# Patient Record
Sex: Female | Born: 1970 | Race: White | Hispanic: No | Marital: Married | Smoking: Never smoker
Health system: Southern US, Community
[De-identification: ages and names within clinical notes are randomized; demographics above are authoritative.]

## PROBLEM LIST (undated history)

## (undated) DIAGNOSIS — I2699 Other pulmonary embolism without acute cor pulmonale: Secondary | ICD-10-CM

## (undated) DIAGNOSIS — D649 Anemia, unspecified: Secondary | ICD-10-CM

## (undated) DIAGNOSIS — I809 Phlebitis and thrombophlebitis of unspecified site: Secondary | ICD-10-CM

## (undated) DIAGNOSIS — F329 Major depressive disorder, single episode, unspecified: Secondary | ICD-10-CM

## (undated) DIAGNOSIS — M1990 Unspecified osteoarthritis, unspecified site: Secondary | ICD-10-CM

## (undated) DIAGNOSIS — F509 Eating disorder, unspecified: Secondary | ICD-10-CM

## (undated) DIAGNOSIS — Z9889 Other specified postprocedural states: Secondary | ICD-10-CM

## (undated) DIAGNOSIS — Z86711 Personal history of pulmonary embolism: Secondary | ICD-10-CM

## (undated) DIAGNOSIS — F419 Anxiety disorder, unspecified: Secondary | ICD-10-CM

## (undated) DIAGNOSIS — F32A Depression, unspecified: Secondary | ICD-10-CM

## (undated) DIAGNOSIS — B019 Varicella without complication: Secondary | ICD-10-CM

## (undated) DIAGNOSIS — M1712 Unilateral primary osteoarthritis, left knee: Secondary | ICD-10-CM

## (undated) DIAGNOSIS — K219 Gastro-esophageal reflux disease without esophagitis: Secondary | ICD-10-CM

## (undated) DIAGNOSIS — I10 Essential (primary) hypertension: Secondary | ICD-10-CM

## (undated) DIAGNOSIS — M1711 Unilateral primary osteoarthritis, right knee: Secondary | ICD-10-CM

## (undated) DIAGNOSIS — Z96652 Presence of left artificial knee joint: Secondary | ICD-10-CM

## (undated) HISTORY — DX: Major depressive disorder, single episode, unspecified: F32.9

## (undated) HISTORY — PX: JOINT REPLACEMENT: SHX530

## (undated) HISTORY — PX: TUBAL LIGATION: SHX77

## (undated) HISTORY — PX: NOVASURE ABLATION: SHX5394

## (undated) HISTORY — DX: Varicella without complication: B01.9

## (undated) HISTORY — DX: Other pulmonary embolism without acute cor pulmonale: I26.99

## (undated) HISTORY — DX: Unspecified osteoarthritis, unspecified site: M19.90

## (undated) HISTORY — PX: SMALL INTESTINE SURGERY: SHX150

## (undated) HISTORY — DX: Phlebitis and thrombophlebitis of unspecified site: I80.9

## (undated) HISTORY — PX: HERNIA REPAIR: SHX51

## (undated) HISTORY — DX: Eating disorder, unspecified: F50.9

## (undated) HISTORY — DX: Depression, unspecified: F32.A

---

## 2000-03-14 DIAGNOSIS — Z86711 Personal history of pulmonary embolism: Secondary | ICD-10-CM | POA: Diagnosis present

## 2000-03-14 HISTORY — DX: Personal history of pulmonary embolism: Z86.711

## 2000-03-14 HISTORY — PX: GASTRIC BYPASS: SHX52

## 2004-06-11 ENCOUNTER — Ambulatory Visit: Payer: Self-pay | Admitting: Obstetrics and Gynecology

## 2008-01-25 ENCOUNTER — Ambulatory Visit: Payer: Self-pay | Admitting: Unknown Physician Specialty

## 2008-01-29 ENCOUNTER — Ambulatory Visit: Payer: Self-pay | Admitting: Unknown Physician Specialty

## 2010-04-18 LAB — HM PAP SMEAR: HM Pap smear: NORMAL

## 2011-08-16 ENCOUNTER — Ambulatory Visit: Payer: Self-pay | Admitting: Orthopedic Surgery

## 2011-08-23 ENCOUNTER — Other Ambulatory Visit: Payer: Self-pay | Admitting: Vascular Surgery

## 2011-08-23 LAB — COMPREHENSIVE METABOLIC PANEL
Albumin: 3.6 g/dL (ref 3.4–5.0)
Anion Gap: 8 (ref 7–16)
Bilirubin,Total: 0.5 mg/dL (ref 0.2–1.0)
Co2: 26 mmol/L (ref 21–32)
Creatinine: 0.84 mg/dL (ref 0.60–1.30)
Glucose: 84 mg/dL (ref 65–99)
SGOT(AST): 23 U/L (ref 15–37)
Sodium: 139 mmol/L (ref 136–145)

## 2011-10-13 ENCOUNTER — Ambulatory Visit: Payer: Self-pay | Admitting: Internal Medicine

## 2011-10-17 ENCOUNTER — Ambulatory Visit (INDEPENDENT_AMBULATORY_CARE_PROVIDER_SITE_OTHER): Payer: 59 | Admitting: Internal Medicine

## 2011-10-17 ENCOUNTER — Encounter: Payer: Self-pay | Admitting: Internal Medicine

## 2011-10-17 VITALS — BP 140/80 | HR 87 | Temp 98.3°F | Ht 65.0 in | Wt 186.2 lb

## 2011-10-17 DIAGNOSIS — Z0000 Encounter for general adult medical examination without abnormal findings: Secondary | ICD-10-CM

## 2011-10-17 DIAGNOSIS — F329 Major depressive disorder, single episode, unspecified: Secondary | ICD-10-CM

## 2011-10-17 DIAGNOSIS — M170 Bilateral primary osteoarthritis of knee: Secondary | ICD-10-CM

## 2011-10-17 DIAGNOSIS — B079 Viral wart, unspecified: Secondary | ICD-10-CM

## 2011-10-17 DIAGNOSIS — E669 Obesity, unspecified: Secondary | ICD-10-CM

## 2011-10-17 DIAGNOSIS — M1710 Unilateral primary osteoarthritis, unspecified knee: Secondary | ICD-10-CM

## 2011-10-17 DIAGNOSIS — Z1239 Encounter for other screening for malignant neoplasm of breast: Secondary | ICD-10-CM

## 2011-10-17 MED ORDER — MELOXICAM 15 MG PO TABS: 15.0000 mg | ORAL_TABLET | Freq: Every day | ORAL | Status: DC

## 2011-10-17 MED ORDER — BUPROPION HCL ER (XL) 150 MG PO TB24: 150.0000 mg | ORAL_TABLET | Freq: Every day | ORAL | Status: DC

## 2011-10-17 MED ORDER — SERTRALINE HCL 25 MG PO TABS: 25.0000 mg | ORAL_TABLET | Freq: Every day | ORAL | Status: DC

## 2011-10-17 NOTE — Assessment & Plan Note (Signed)
BMI 30. Patient is status post gastric bypass surgery. Discussed food addiction today. Encouraged her to consider keeping a food diary and/or participating in additional exercise activities such as water activities. We'll also add Wellbutrin to see if this helps with appetite.

## 2011-10-17 NOTE — Progress Notes (Signed)
Subjective:    Patient ID: Anne James, female    DOB: 1970-05-22, 41 y.o.   MRN: [id number]  HPI 41 year old female presents to establish care. She reports a history of morbid obesity for which she underwent gastric bypass surgery. She reports that she tolerated the procedure well and got down to a low weight of approximately 145. Over the last year she has regained approximately 25 pounds. 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 She notes it has been a stressful time for her family. 16 years ago she lost her son to come occasions of down syndrome. She is now dealing with ongoing issues with her daughter who has been ill. In an effort to help with anxiety and depression, her previous PCP started her on sertraline. She reports that she seems to have gained more weight since starting this medication. She would like to try tapering off the medication. Next  She also notes a history of arthritis in her knees right greater than left. She has been followed by orthopedic surgery and had cortisone injection in her right knee a few months ago. She reports some improvement with this. She also takes meloxicam with some improvement in her symptoms. Her exercise has been limited by pain in her right knee.  Outpatient Encounter Prescriptions as of 10/17/2011  Medication Sig Dispense Refill  . buPROPion (WELLBUTRIN XL) 150 MG 24 hr tablet Take 1 tablet (150 mg total) by mouth daily.  30 tablet  6  . Cholecalciferol (VITAMIN D-3) 5000 UNITS TABS Take 1 tablet by mouth daily.      . cyanocobalamin 500 MCG tablet Take 500 mcg by mouth daily.      . meloxicam (MOBIC) 15 MG tablet Take 1 tablet (15 mg total) by mouth daily.  30 tablet  6  . Multiple Vitamin (MULTIVITAMIN) tablet Take 1 tablet by mouth daily.      . sertraline (ZOLOFT) 25 MG tablet Take 1 tablet (25 mg total) by mouth daily.  30 tablet  6  . DISCONTD: sertraline (ZOLOFT) 50 MG tablet Take 50 mg by mouth daily.      [name]  DISCONTD: sertraline (ZOLOFT) 50 MG tablet         Review of Systems  Constitutional: Negative for fever, chills, appetite change, fatigue and unexpected weight change.  HENT: Negative for ear pain, congestion, sore throat, trouble swallowing, neck pain, voice change and sinus pressure.   Eyes: Negative for visual disturbance.  Respiratory: Negative for cough, shortness of breath, wheezing and stridor.   Cardiovascular: Negative for chest pain, palpitations and leg swelling.  Gastrointestinal: Negative for nausea, vomiting, abdominal pain, diarrhea, constipation, blood in stool, abdominal distention and anal bleeding.  Genitourinary: Negative for dysuria and flank pain.  Musculoskeletal: Negative for myalgias, arthralgias and gait problem.  Skin: Negative for color change and rash.  Neurological: Negative for dizziness and headaches.  Hematological: Negative for adenopathy. Does not bruise/bleed easily.  Psychiatric/Behavioral: Positive for dysphoric mood. Negative for suicidal ideas and disturbed wake/sleep cycle. The patient is nervous/anxious.        Objective:   Physical Exam  Constitutional: She is oriented to person, place, and time. She appears well-developed and well-nourished. No distress.  HENT:  Head: Normocephalic and atraumatic.  Right Ear: External ear normal.  Left Ear: External ear normal.  Nose: Nose normal.  Mouth/Throat: Oropharynx is clear and moist. No oropharyngeal exudate.  Eyes: Conjunctivae are normal. Pupils are equal, round, and reactive to light. Right eye exhibits no  discharge. Left eye exhibits no discharge. No scleral icterus.  Neck: Normal range of motion. Neck supple. No tracheal deviation present. No thyromegaly present.  Cardiovascular: Normal rate, regular rhythm, normal heart sounds and intact distal pulses.  Exam reveals no gallop and no friction rub.   No murmur heard. Pulmonary/Chest: Effort normal and breath sounds normal. No respiratory  distress. She has no wheezes. She has no rales. She exhibits no tenderness.  Musculoskeletal: Normal range of motion. She exhibits no edema and no tenderness.  Lymphadenopathy:    She has no cervical adenopathy.  Neurological: She is alert and oriented to person, place, and time. No cranial nerve deficit. She exhibits normal muscle tone. Coordination normal.  Skin: Skin is warm and dry. Rash noted. Rash is papular (verrucae on right hand). She is not diaphoretic. No erythema. No pallor.  Psychiatric: Her speech is normal and behavior is normal. Judgment and thought content normal. Cognition and memory are normal. She exhibits a depressed mood.          Assessment & Plan:

## 2011-10-17 NOTE — Assessment & Plan Note (Signed)
Symptoms persistent on sertraline. Will taper off sertraline by decreasing to 25 mg daily x1 month. Will add Wellbutrin 150 mg daily. Followup one month.

## 2011-10-17 NOTE — Assessment & Plan Note (Signed)
Numerous sports over right hand. Not responsive to topical over-the-counter medications. Will set up dermatology evaluation for cryotherapy.

## 2011-10-17 NOTE — Assessment & Plan Note (Signed)
Osteoarthritis both knees. Encouraged her to followup with her orthopedic surgeon for repeat steroid injection in her right knee. We'll continue meloxicam. Encouraged her to participate in nonweightbearing activities such as swimming.

## 2011-10-17 NOTE — Assessment & Plan Note (Signed)
Mammogram ordered today 

## 2011-10-18 LAB — B12 AND FOLATE PANEL: Folate: 20.9 ng/mL (ref >5.9)

## 2011-10-20 LAB — CBC WITH DIFFERENTIAL/PLATELET
Basophils Absolute: 0 10*3/uL (ref 0.0–0.2)
Basos: 0 % (ref 0–3)
Hemoglobin: 12.7 g/dL (ref 11.1–15.9)
Immature Grans (Abs): 0 10*3/uL (ref 0.0–0.1)
Lymphs: 21 % (ref 14–46)
Monocytes: 11 % (ref 4–13)
Neutrophils Absolute: 5.4 10*3/uL (ref 1.8–7.8)
Neutrophils Relative %: 66 % (ref 40–74)
RBC: 4.61 x10E6/uL (ref 3.77–5.28)

## 2011-10-20 LAB — COMPREHENSIVE METABOLIC PANEL
Albumin/Globulin Ratio: 1.5 (ref 1.1–2.5)
Albumin: 4.2 g/dL (ref 3.5–5.5)
BUN: 15 mg/dL (ref 6–24)
Creatinine, Ser: 0.76 mg/dL (ref 0.57–1.00)
GFR calc Af Amer: 111 mL/min/{1.73_m2} (ref >59)
GFR calc non Af Amer: 96 mL/min/{1.73_m2} (ref >59)
Glucose: 93 mg/dL (ref 65–99)
Total Bilirubin: 0.4 mg/dL (ref 0.0–1.2)
Total Protein: 7 g/dL (ref 6.0–8.5)

## 2011-10-20 LAB — LIPID PANEL
HDL: 114 mg/dL (ref >39)
LDL Calculated: 94 mg/dL (ref 0–99)
VLDL Cholesterol Cal: 17 mg/dL (ref 5–40)

## 2011-10-20 LAB — TSH: TSH: 1.86 u[IU]/mL (ref 0.450–4.500)

## 2011-11-01 ENCOUNTER — Encounter: Payer: Self-pay | Admitting: Internal Medicine

## 2011-11-01 LAB — HM MAMMOGRAPHY: HM Mammogram: NORMAL

## 2011-11-16 ENCOUNTER — Ambulatory Visit: Payer: 59 | Admitting: Internal Medicine

## 2011-11-24 ENCOUNTER — Encounter: Payer: Self-pay | Admitting: Internal Medicine

## 2011-11-24 ENCOUNTER — Ambulatory Visit (INDEPENDENT_AMBULATORY_CARE_PROVIDER_SITE_OTHER): Payer: 59 | Admitting: Internal Medicine

## 2011-11-24 VITALS — BP 120/90 | HR 71 | Temp 98.5°F | Ht 65.0 in | Wt 186.2 lb

## 2011-11-24 DIAGNOSIS — M1710 Unilateral primary osteoarthritis, unspecified knee: Secondary | ICD-10-CM

## 2011-11-24 DIAGNOSIS — M170 Bilateral primary osteoarthritis of knee: Secondary | ICD-10-CM

## 2011-11-24 DIAGNOSIS — F329 Major depressive disorder, single episode, unspecified: Secondary | ICD-10-CM

## 2011-11-24 MED ORDER — MELOXICAM 15 MG PO TABS: 15.0000 mg | ORAL_TABLET | Freq: Every day | ORAL | Status: AC

## 2011-11-24 NOTE — Assessment & Plan Note (Signed)
Symptoms well controlled with meloxicam. We'll continue. 

## 2011-11-24 NOTE — Progress Notes (Signed)
Subjective:    Patient ID: Anne James, female    DOB: 06-02-1970, 41 y.o.   MRN: [id number]  HPI 41 year old female with history of anxiety/depression, obesity, osteoarthritis presents for followup. She reports that the last month has been difficult for her because of ongoing health issues with her daughter. She is tearful when describing stress related to care for her daughter. She changed from Zoloft to Wellbutrin at her last visit and reports that symptoms seem to be better controlled with Wellbutrin. She denies any side effects from this medication.  In regards to osteoarthritis, she reports good control of symptoms of pain in her knee with use of meloxicam.  Outpatient Encounter Prescriptions as of 11/24/2011  Medication Sig Dispense Refill  . buPROPion (WELLBUTRIN XL) 150 MG 24 hr tablet Take 1 tablet (150 mg total) by mouth daily.  30 tablet  6  . Cholecalciferol (VITAMIN D-3) 5000 UNITS TABS Take 1 tablet by mouth daily.      . cyanocobalamin 500 MCG tablet Take 500 mcg by mouth daily.      . meloxicam (MOBIC) 15 MG tablet Take 1 tablet (15 mg total) by mouth daily.  30 tablet  6  . Multiple Vitamin (MULTIVITAMIN) tablet Take 1 tablet by mouth daily.       BP 120/90  Pulse 71  Temp 98.5 F (36.9 C) (Oral)  Ht 5\' 5"  (1.651 m)  Wt 186 lb 4 oz (84.482 kg)  BMI 30.99 kg/m2  SpO2 99%  Review of Systems  Constitutional: Negative for fever, chills, appetite change, fatigue and unexpected weight change.  HENT: Negative for ear pain, congestion, sore throat, trouble swallowing, neck pain, voice change and sinus pressure.   Eyes: Negative for visual disturbance.  Respiratory: Negative for cough, shortness of breath, wheezing and stridor.   Cardiovascular: Negative for chest pain, palpitations and leg swelling.  Gastrointestinal: Negative for nausea, vomiting, abdominal pain, diarrhea, constipation, blood in stool, abdominal distention and anal bleeding.  Genitourinary: Negative for  dysuria and flank pain.  Musculoskeletal: Positive for arthralgias. Negative for myalgias and gait problem.  Skin: Negative for color change and rash.  Neurological: Negative for dizziness and headaches.  Hematological: Negative for adenopathy. Does not bruise/bleed easily.  Psychiatric/Behavioral: Positive for dysphoric mood. Negative for suicidal ideas and disturbed wake/sleep cycle. The patient is nervous/anxious.        Objective:   Physical Exam  Constitutional: She is oriented to person, place, and time. She appears well-developed and well-nourished. No distress.  HENT:  Head: Normocephalic and atraumatic.  Right Ear: External ear normal.  Left Ear: External ear normal.  Nose: Nose normal.  Mouth/Throat: Oropharynx is clear and moist. No oropharyngeal exudate.  Eyes: Conjunctivae normal are normal. Pupils are equal, round, and reactive to light. Right eye exhibits no discharge. Left eye exhibits no discharge. No scleral icterus.  Neck: Normal range of motion. Neck supple. No tracheal deviation present. No thyromegaly present.  Cardiovascular: Normal rate, regular rhythm, normal heart sounds and intact distal pulses.  Exam reveals no gallop and no friction rub.   No murmur heard. Pulmonary/Chest: Effort normal and breath sounds normal. No respiratory distress. She has no wheezes. She has no rales. She exhibits no tenderness.  Musculoskeletal: Normal range of motion. She exhibits no edema and no tenderness.  Lymphadenopathy:    She has no cervical adenopathy.  Neurological: She is alert and oriented to person, place, and time. No cranial nerve deficit. She exhibits normal muscle tone. Coordination normal.  Skin:  Skin is warm and dry. No rash noted. She is not diaphoretic. No erythema. No pallor.  Psychiatric: Her behavior is normal. Judgment and thought content normal. Her mood appears anxious. She exhibits a depressed mood.          Assessment & Plan:

## 2011-11-24 NOTE — Assessment & Plan Note (Signed)
Symptoms recently exacerbated by ongoing issues with her daughter. However, symptoms improved with use of Wellbutrin. Will continue. We discussed potentially increasing dose to 300 mg in the future. For now will be for 150 mg daily. Followup in 3 months or sooner as needed.

## 2011-12-01 ENCOUNTER — Encounter: Payer: Self-pay | Admitting: Internal Medicine

## 2012-02-23 ENCOUNTER — Encounter: Payer: Self-pay | Admitting: Internal Medicine

## 2012-02-23 ENCOUNTER — Ambulatory Visit (INDEPENDENT_AMBULATORY_CARE_PROVIDER_SITE_OTHER): Payer: 59 | Admitting: Internal Medicine

## 2012-02-23 VITALS — BP 118/80 | HR 58 | Temp 98.2°F | Resp 16 | Wt 182.5 lb

## 2012-02-23 DIAGNOSIS — M25569 Pain in unspecified knee: Secondary | ICD-10-CM

## 2012-02-23 DIAGNOSIS — M25551 Pain in right hip: Secondary | ICD-10-CM

## 2012-02-23 DIAGNOSIS — E669 Obesity, unspecified: Secondary | ICD-10-CM

## 2012-02-23 DIAGNOSIS — F329 Major depressive disorder, single episode, unspecified: Secondary | ICD-10-CM

## 2012-02-23 DIAGNOSIS — M25561 Pain in right knee: Secondary | ICD-10-CM | POA: Insufficient documentation

## 2012-02-23 DIAGNOSIS — M25559 Pain in unspecified hip: Secondary | ICD-10-CM

## 2012-02-23 MED ORDER — TRAMADOL HCL 50 MG PO TABS: 50.0000 mg | ORAL_TABLET | Freq: Three times a day (TID) | ORAL | Status: DC | PRN

## 2012-02-23 MED ORDER — FLUOXETINE HCL 20 MG PO TABS: 20.0000 mg | ORAL_TABLET | Freq: Every day | ORAL | Status: DC

## 2012-02-23 MED ORDER — HYDROCODONE-ACETAMINOPHEN 5-325 MG PO TABS: 1.0000 | ORAL_TABLET | Freq: Three times a day (TID) | ORAL | Status: DC | PRN

## 2012-02-23 NOTE — Assessment & Plan Note (Signed)
Symptoms are consistent with osteoarthritis. Will add tramadol for better pain control. Will continue meloxicam. We'll use Vicodin as needed for severe pain. Will set up a followup with her orthopedic surgeon. Question if she might benefit from steroid injection.

## 2012-02-23 NOTE — Progress Notes (Signed)
Subjective:    Patient ID: Anne James, female    DOB: November 26, 1970, 41 y.o.   MRN: [id number]  HPI 41 year old female with history of obesity status post gastric bypass surgery, osteoarthritis, and depression presents for followup. In regards to depression, she reports that symptoms are well controlled with Wellbutrin. However, recently the cost of this medication has increased and she is unable to afford the medicine. She would like to change to a different medicine.  In regards to osteoarthritis, she reports dealing severe pain in her right hip and right knee. She was last seen by her orthopedic surgeon this spring. She had an injection in her right knee with some improvement. She denies any new trauma to her hip or knee. Pain is described as aching that radiates from the hip down to the knee. She occasionally hears popping in her right knee. She sometimes has instability in her knee. She denies any falls. She denies weakness in her leg. She is taking meloxicam and ibuprofen with no improvement. Pain often keeps her from sleeping.  In regards to her obesity, she reports she has been diligently trying to improve her diet and exercise. She is started exercising by walking and has lost 4 pounds since her last visit.  Outpatient Encounter Prescriptions as of 02/23/2012  Medication Sig Dispense Refill  . Cholecalciferol (VITAMIN D-3) 5000 UNITS TABS Take 1 tablet by mouth daily.      . cyanocobalamin 500 MCG tablet Take 500 mcg by mouth daily.      . meloxicam (MOBIC) 15 MG tablet Take 1 tablet (15 mg total) by mouth daily.  30 tablet  6  . Multiple Vitamin (MULTIVITAMIN) tablet Take 1 tablet by mouth daily.      . [DISCONTINUED] buPROPion (WELLBUTRIN XL) 150 MG 24 hr tablet Take 1 tablet (150 mg total) by mouth daily.  30 tablet  6  . FLUoxetine (PROZAC) 20 MG tablet Take 1 tablet (20 mg total) by mouth daily.  30 tablet  3  . HYDROcodone-acetaminophen (NORCO/VICODIN) 5-325 MG per tablet Take 1  tablet by mouth every 8 (eight) hours as needed for pain.  90 tablet  0  . traMADol (ULTRAM) 50 MG tablet Take 1 tablet (50 mg total) by mouth every 8 (eight) hours as needed for pain.  90 tablet  3   BP 118/80  Pulse 58  Temp 98.2 F (36.8 C) (Oral)  Resp 16  Wt 182 lb 8 oz (82.781 kg)  Review of Systems  Constitutional: Negative for fever, chills, appetite change, fatigue and unexpected weight change.  HENT: Negative for ear pain, congestion, sore throat, trouble swallowing, neck pain, voice change and sinus pressure.   Eyes: Negative for visual disturbance.  Respiratory: Negative for cough, shortness of breath, wheezing and stridor.   Cardiovascular: Negative for chest pain, palpitations and leg swelling.  Gastrointestinal: Negative for nausea, vomiting, abdominal pain, diarrhea, constipation, blood in stool, abdominal distention and anal bleeding.  Genitourinary: Negative for dysuria and flank pain.  Musculoskeletal: Positive for myalgias and arthralgias. Negative for gait problem.  Skin: Negative for color change and rash.  Neurological: Negative for dizziness and headaches.  Hematological: Negative for adenopathy. Does not bruise/bleed easily.  Psychiatric/Behavioral: Positive for dysphoric mood. Negative for suicidal ideas and sleep disturbance. The patient is not nervous/anxious.        Objective:   Physical Exam  Constitutional: She is oriented to person, place, and time. She appears well-developed and well-nourished. No distress.  HENT:  Head: Normocephalic  and atraumatic.  Right Ear: External ear normal.  Left Ear: External ear normal.  Nose: Nose normal.  Mouth/Throat: Oropharynx is clear and moist. No oropharyngeal exudate.  Eyes: Conjunctivae normal are normal. Pupils are equal, round, and reactive to light. Right eye exhibits no discharge. Left eye exhibits no discharge. No scleral icterus.  Neck: Normal range of motion. Neck supple. No tracheal deviation present.  No thyromegaly present.  Cardiovascular: Normal rate, regular rhythm, normal heart sounds and intact distal pulses.  Exam reveals no gallop and no friction rub.   No murmur heard. Pulmonary/Chest: Effort normal and breath sounds normal. No respiratory distress. She has no wheezes. She has no rales. She exhibits no tenderness.  Musculoskeletal: Normal range of motion. She exhibits no edema and no tenderness.       Right hip: She exhibits normal range of motion, normal strength and no tenderness.       Right knee: She exhibits normal range of motion and no swelling.  Lymphadenopathy:    She has no cervical adenopathy.  Neurological: She is alert and oriented to person, place, and time. No cranial nerve deficit. She exhibits normal muscle tone. Coordination normal.  Skin: Skin is warm and dry. No rash noted. She is not diaphoretic. No erythema. No pallor.  Psychiatric: She has a normal mood and affect. Her behavior is normal. Judgment and thought content normal.          Assessment & Plan:

## 2012-02-23 NOTE — Assessment & Plan Note (Addendum)
Encouraged patient to continue efforts at healthy diet and regular physical activity.

## 2012-02-23 NOTE — Assessment & Plan Note (Signed)
Right knee pain is consistent with osteoarthritis. Will add tramadol for better pain control. Will continue meloxicam. Given recent popping sound, question if she may also have a meniscal tear. Will set up orthopedic followup.

## 2012-02-23 NOTE — Assessment & Plan Note (Signed)
Symptoms of depression are improved on Wellbutrin however she is unable to afford this medication. Will try changing to Prozac. We'll have her followup in one month or sooner as needed.

## 2012-02-24 ENCOUNTER — Encounter: Payer: Self-pay | Admitting: Internal Medicine

## 2012-03-29 ENCOUNTER — Ambulatory Visit (INDEPENDENT_AMBULATORY_CARE_PROVIDER_SITE_OTHER): Payer: Commercial Indemnity | Admitting: Internal Medicine

## 2012-03-29 ENCOUNTER — Encounter: Payer: Self-pay | Admitting: Internal Medicine

## 2012-03-29 VITALS — BP 120/82 | HR 60 | Temp 98.7°F | Ht 65.0 in | Wt 179.5 lb

## 2012-03-29 DIAGNOSIS — E669 Obesity, unspecified: Secondary | ICD-10-CM

## 2012-03-29 DIAGNOSIS — M25559 Pain in unspecified hip: Secondary | ICD-10-CM

## 2012-03-29 DIAGNOSIS — M25561 Pain in right knee: Secondary | ICD-10-CM

## 2012-03-29 DIAGNOSIS — M25551 Pain in right hip: Secondary | ICD-10-CM

## 2012-03-29 DIAGNOSIS — F3289 Other specified depressive episodes: Secondary | ICD-10-CM

## 2012-03-29 DIAGNOSIS — M25569 Pain in unspecified knee: Secondary | ICD-10-CM

## 2012-03-29 DIAGNOSIS — F329 Major depressive disorder, single episode, unspecified: Secondary | ICD-10-CM

## 2012-03-29 MED ORDER — HYDROCODONE-ACETAMINOPHEN 5-325 MG PO TABS: 1.0000 | ORAL_TABLET | Freq: Three times a day (TID) | ORAL | Status: DC | PRN

## 2012-03-29 NOTE — Progress Notes (Signed)
Subjective:    Patient ID: Anne James, female    DOB: 08-08-1970, 42 y.o.   MRN: [id number]  HPI 42 year old female with history of depression and right hip and knee pain presents for followup. In the interim since her last visit she was seen by orthopedics and diagnosed with bursitis. She has been treated with Celebrex and tramadol as needed. She reports improvement with this. She is also wearing a brace while at work with some improvement. She has followup scheduled with orthopedics next month.  In regards to depression, she reports that symptoms have been well-controlled with Prozac. She denies any side effects of this medication.  She reports she is still trying to lose weight by improving her diet and increasing physical activity. She has lost almost 10lbs in 3 months.  Outpatient Encounter Prescriptions as of 03/29/2012  Medication Sig Dispense Refill  . celecoxib (CELEBREX) 200 MG capsule Take 200 mg by mouth daily.      . Cholecalciferol (VITAMIN D-3) 5000 UNITS TABS Take 1 tablet by mouth daily.      . cyanocobalamin 500 MCG tablet Take 500 mcg by mouth daily.      [name] FLUoxetine (PROZAC) 20 MG tablet Take 1 tablet (20 mg total) by mouth daily.  30 tablet  3  . HYDROcodone-acetaminophen (NORCO/VICODIN) 5-325 MG per tablet Take 1 tablet by mouth every 8 (eight) hours as needed for pain.  90 tablet  3  . meloxicam (MOBIC) 15 MG tablet Take 1 tablet (15 mg total) by mouth daily.  30 tablet  6  . Multiple Vitamin (MULTIVITAMIN) tablet Take 1 tablet by mouth daily.      . traMADol (ULTRAM) 50 MG tablet Take 1 tablet (50 mg total) by mouth every 8 (eight) hours as needed for pain.  90 tablet  3   BP 120/82  Pulse 60  Temp 98.7 F (37.1 C) (Oral)  Ht 5\' 5"  (1.651 m)  Wt 179 lb 8 oz (81.421 kg)  BMI 29.87 kg/m2  SpO2 98%  Review of Systems  Constitutional: Negative for fever, chills, appetite change, fatigue and unexpected weight change.  HENT: Negative for ear pain, congestion, sore  throat, trouble swallowing, neck pain, voice change and sinus pressure.   Eyes: Negative for visual disturbance.  Respiratory: Negative for cough, shortness of breath, wheezing and stridor.   Cardiovascular: Negative for chest pain, palpitations and leg swelling.  Gastrointestinal: Negative for nausea, vomiting, abdominal pain, diarrhea, constipation, blood in stool, abdominal distention and anal bleeding.  Genitourinary: Negative for dysuria and flank pain.  Musculoskeletal: Positive for myalgias and arthralgias. Negative for gait problem.  Skin: Negative for color change and rash.  Neurological: Negative for dizziness and headaches.  Hematological: Negative for adenopathy. Does not bruise/bleed easily.  Psychiatric/Behavioral: Negative for suicidal ideas, sleep disturbance and dysphoric mood. The patient is not nervous/anxious.        Objective:   Physical Exam  Constitutional: She is oriented to person, place, and time. She appears well-developed and well-nourished. No distress.  HENT:  Head: Normocephalic and atraumatic.  Right Ear: External ear normal.  Left Ear: External ear normal.  Nose: Nose normal.  Mouth/Throat: Oropharynx is clear and moist. No oropharyngeal exudate.  Eyes: Conjunctivae normal are normal. Pupils are equal, round, and reactive to light. Right eye exhibits no discharge. Left eye exhibits no discharge. No scleral icterus.  Neck: Normal range of motion. Neck supple. No tracheal deviation present. No thyromegaly present.  Cardiovascular: Normal rate, regular rhythm, normal heart  sounds and intact distal pulses.  Exam reveals no gallop and no friction rub.   No murmur heard. Pulmonary/Chest: Effort normal and breath sounds normal. No respiratory distress. She has no wheezes. She has no rales. She exhibits no tenderness.  Musculoskeletal: Normal range of motion. She exhibits no edema and no tenderness.       Right hip: She exhibits normal range of motion and normal  strength.       Right knee: She exhibits normal range of motion and no swelling.       Left knee: Normal. She exhibits normal range of motion and no swelling.  Lymphadenopathy:    She has no cervical adenopathy.  Neurological: She is alert and oriented to person, place, and time. No cranial nerve deficit. She exhibits normal muscle tone. Coordination normal.  Skin: Skin is warm and dry. No rash noted. She is not diaphoretic. No erythema. No pallor.  Psychiatric: She has a normal mood and affect. Her behavior is normal. Judgment and thought content normal.          Assessment & Plan:

## 2012-03-29 NOTE — Assessment & Plan Note (Signed)
S/p orthopedic evaluation. Diagnosed with bursitis leading to symptoms. Wearing brace with some improvement. Will continue. Continue tramadol prn and Vicodin prn severe pain.

## 2012-03-29 NOTE — Assessment & Plan Note (Signed)
Body mass index is 29.87 kg/(m^2). Congratulated pt on weight loss. Encouraged her to continue healthy diet and regular physical activity as tolerated.

## 2012-03-29 NOTE — Assessment & Plan Note (Signed)
Symptoms well controlled with Prozac. Will continue.

## 2012-05-22 ENCOUNTER — Encounter: Payer: Self-pay | Admitting: Internal Medicine

## 2012-05-22 DIAGNOSIS — F32A Depression, unspecified: Secondary | ICD-10-CM

## 2012-05-22 DIAGNOSIS — M25551 Pain in right hip: Secondary | ICD-10-CM

## 2012-05-22 DIAGNOSIS — M25561 Pain in right knee: Secondary | ICD-10-CM

## 2012-05-23 MED ORDER — TRAMADOL HCL 50 MG PO TABS: 50.0000 mg | ORAL_TABLET | Freq: Three times a day (TID) | ORAL | Status: DC | PRN

## 2012-05-23 MED ORDER — FLUOXETINE HCL 20 MG PO TABS: 20.0000 mg | ORAL_TABLET | Freq: Every day | ORAL | Status: DC

## 2012-05-23 MED ORDER — CELECOXIB 200 MG PO CAPS: 200.0000 mg | ORAL_CAPSULE | Freq: Every day | ORAL | Status: DC

## 2012-05-24 ENCOUNTER — Encounter: Payer: Self-pay | Admitting: Internal Medicine

## 2012-08-13 ENCOUNTER — Other Ambulatory Visit: Payer: Self-pay | Admitting: Internal Medicine

## 2012-08-13 NOTE — Telephone Encounter (Signed)
Okay to refill? 

## 2012-08-15 ENCOUNTER — Other Ambulatory Visit: Payer: Self-pay | Admitting: Internal Medicine

## 2012-08-15 NOTE — Telephone Encounter (Signed)
Okay to refill? 

## 2012-09-11 ENCOUNTER — Other Ambulatory Visit: Payer: Self-pay | Admitting: *Deleted

## 2012-09-11 MED ORDER — HYDROCODONE-ACETAMINOPHEN 5-325 MG PO TABS: ORAL_TABLET | ORAL | Status: DC

## 2012-10-15 ENCOUNTER — Other Ambulatory Visit: Payer: Self-pay | Admitting: *Deleted

## 2012-10-15 MED ORDER — HYDROCODONE-ACETAMINOPHEN 5-325 MG PO TABS: ORAL_TABLET | ORAL | Status: DC

## 2012-10-15 NOTE — Telephone Encounter (Signed)
Fine to fill, but must complete narcotic contract and urine drug screen prior to getting RX.

## 2012-10-23 ENCOUNTER — Telehealth: Payer: Self-pay | Admitting: *Deleted

## 2012-10-23 NOTE — Telephone Encounter (Signed)
What labs and dx?  

## 2012-10-23 NOTE — Telephone Encounter (Signed)
V70.0, CBC, CMP, lipids, TSH, VitD, urine microalbumin

## 2012-10-24 ENCOUNTER — Other Ambulatory Visit: Payer: Self-pay | Admitting: *Deleted

## 2012-10-24 ENCOUNTER — Other Ambulatory Visit (INDEPENDENT_AMBULATORY_CARE_PROVIDER_SITE_OTHER): Payer: Commercial Indemnity

## 2012-10-24 DIAGNOSIS — Z0000 Encounter for general adult medical examination without abnormal findings: Secondary | ICD-10-CM

## 2012-10-24 LAB — CBC WITH DIFFERENTIAL/PLATELET
Basophils Relative: 0.3 % (ref 0.0–3.0)
Eosinophils Absolute: 0.2 10*3/uL (ref 0.0–0.7)
Eosinophils Relative: 4.2 % (ref 0.0–5.0)
HCT: 38.7 % (ref 36.0–46.0)
Lymphs Abs: 1.6 10*3/uL (ref 0.7–4.0)
MCHC: 33 g/dL (ref 30.0–36.0)
MCV: 88.2 fl (ref 78.0–100.0)
Monocytes Absolute: 0.5 10*3/uL (ref 0.1–1.0)
RBC: 4.39 Mil/uL (ref 3.87–5.11)
WBC: 5.3 10*3/uL (ref 4.5–10.5)

## 2012-10-24 LAB — MICROALBUMIN / CREATININE URINE RATIO: Microalb Creat Ratio: 6 mg/g (ref 0.0–30.0)

## 2012-10-25 LAB — COMPREHENSIVE METABOLIC PANEL
ALT: 16 U/L (ref 0–35)
AST: 26 U/L (ref 0–37)
Albumin: 3.9 g/dL (ref 3.5–5.2)
BUN: 10 mg/dL (ref 6–23)
CO2: 28 mEq/L (ref 19–32)
Calcium: 8.9 mg/dL (ref 8.4–10.5)
Creatinine, Ser: 0.8 mg/dL (ref 0.4–1.2)
Glucose, Bld: 77 mg/dL (ref 70–99)
Total Protein: 6.9 g/dL (ref 6.0–8.3)

## 2012-10-25 LAB — VITAMIN D 25 HYDROXY (VIT D DEFICIENCY, FRACTURES): Vit D, 25-Hydroxy: 74 ng/mL (ref 30–89)

## 2012-10-25 LAB — LIPID PANEL
LDL Cholesterol: 93 mg/dL (ref 0–99)
Total CHOL/HDL Ratio: 2

## 2012-10-31 ENCOUNTER — Ambulatory Visit (INDEPENDENT_AMBULATORY_CARE_PROVIDER_SITE_OTHER): Payer: Commercial Indemnity | Admitting: Internal Medicine

## 2012-10-31 ENCOUNTER — Encounter: Payer: Self-pay | Admitting: Internal Medicine

## 2012-10-31 ENCOUNTER — Other Ambulatory Visit (HOSPITAL_COMMUNITY)
Admission: RE | Admit: 2012-10-31 | Discharge: 2012-10-31 | Disposition: A | Discharge status: Home / Self Care | Payer: Commercial Indemnity | Source: Ambulatory Visit | Attending: Internal Medicine | Admitting: Internal Medicine

## 2012-10-31 VITALS — BP 102/80 | HR 60 | Temp 98.2°F | Ht 65.0 in | Wt 170.0 lb

## 2012-10-31 DIAGNOSIS — Z1151 Encounter for screening for human papillomavirus (HPV): Secondary | ICD-10-CM | POA: Insufficient documentation

## 2012-10-31 DIAGNOSIS — Z136 Encounter for screening for cardiovascular disorders: Secondary | ICD-10-CM

## 2012-10-31 DIAGNOSIS — Z0001 Encounter for general adult medical examination with abnormal findings: Secondary | ICD-10-CM | POA: Insufficient documentation

## 2012-10-31 DIAGNOSIS — M25551 Pain in right hip: Secondary | ICD-10-CM

## 2012-10-31 DIAGNOSIS — M25559 Pain in unspecified hip: Secondary | ICD-10-CM

## 2012-10-31 DIAGNOSIS — Z01419 Encounter for gynecological examination (general) (routine) without abnormal findings: Secondary | ICD-10-CM | POA: Insufficient documentation

## 2012-10-31 DIAGNOSIS — Z0000 Encounter for general adult medical examination without abnormal findings: Secondary | ICD-10-CM

## 2012-10-31 LAB — HM PAP SMEAR: HM Pap smear: NEGATIVE

## 2012-10-31 NOTE — Progress Notes (Signed)
Subjective:    Patient ID: Anne James, female    DOB: 09/22/1970, 42 y.o.   MRN: [id number]  HPI 43 year old female with history of obesity status post gastric bypass surgery, osteoarthritis, depression/anxiety presents for annual exam. She reports she is generally feeling well. She has been following a healthy diet and exercising by walking on a regular basis. Her only concern today is persistent aching pain in her right hip. This becomes severe with increased physical activity. She is taking meloxicam with some improvement but frequently has breakthrough pain requiring hydrocodone. She is using this up to 3 times per day. She denies any side effects from the medication. She reports she was seen by orthopedic surgery who discussed potential interventions for osteoarthritis in her knees. However they have held off on surgery for now.  Outpatient Encounter Prescriptions as of 10/31/2012  Medication Sig Dispense Refill  . Cholecalciferol (VITAMIN D-3) 5000 UNITS TABS Take 1 tablet by mouth daily.      . cyanocobalamin 500 MCG tablet Take 500 mcg by mouth daily.      [name] FLUoxetine (PROZAC) 20 MG tablet Take 1 tablet (20 mg total) by mouth daily.  90 tablet  3  . HYDROcodone-acetaminophen (NORCO/VICODIN) 5-325 MG per tablet TAKE ONE TABLET BY MOUTH EVERY 8 HOURS AS NEEDED FOR PAIN  90 tablet  0  . meloxicam (MOBIC) 15 MG tablet Take 1 tablet (15 mg total) by mouth daily.  30 tablet  6  . Multiple Vitamin (MULTIVITAMIN) tablet Take 1 tablet by mouth daily.      . traMADol (ULTRAM) 50 MG tablet Take 1 tablet (50 mg total) by mouth every 8 (eight) hours as needed for pain.  270 tablet  3  . [DISCONTINUED] celecoxib (CELEBREX) 200 MG capsule Take 1 capsule (200 mg total) by mouth daily.  90 capsule  3   No facility-administered encounter medications on file as of 10/31/2012.   BP 102/80  Pulse 60  Temp(Src) 98.2 F (36.8 C) (Oral)  Ht 5\' 5"  (1.651 m)  Wt 170 lb (77.111 kg)  BMI 28.29 kg/m2  SpO2  98%  Review of Systems  Constitutional: Negative for fever, chills, appetite change, fatigue and unexpected weight change.  HENT: Negative for ear pain, congestion, sore throat, trouble swallowing, neck pain, voice change and sinus pressure.   Eyes: Negative for visual disturbance.  Respiratory: Negative for cough, shortness of breath, wheezing and stridor.   Cardiovascular: Negative for chest pain, palpitations and leg swelling.  Gastrointestinal: Negative for nausea, vomiting, abdominal pain, diarrhea, constipation, blood in stool, abdominal distention and anal bleeding.  Genitourinary: Negative for dysuria and flank pain.  Musculoskeletal: Positive for arthralgias (right hip and knees). Negative for myalgias and gait problem.  Skin: Negative for color change and rash.  Neurological: Negative for dizziness and headaches.  Hematological: Negative for adenopathy. Does not bruise/bleed easily.  Psychiatric/Behavioral: Negative for suicidal ideas, sleep disturbance and dysphoric mood. The patient is not nervous/anxious.        Objective:   Physical Exam  Constitutional: She is oriented to person, place, and time. She appears well-developed and well-nourished. No distress.  HENT:  Head: Normocephalic and atraumatic.  Right Ear: External ear normal.  Left Ear: External ear normal.  Nose: Nose normal.  Mouth/Throat: Oropharynx is clear and moist. No oropharyngeal exudate.  Eyes: Conjunctivae are normal. Pupils are equal, round, and reactive to light. Right eye exhibits no discharge. Left eye exhibits no discharge. No scleral icterus.  Neck: Normal range of  motion. Neck supple. No tracheal deviation present. No thyromegaly present.  Cardiovascular: Normal rate, regular rhythm, normal heart sounds and intact distal pulses.  Exam reveals no gallop and no friction rub.   No murmur heard. Pulmonary/Chest: Effort normal and breath sounds normal. No respiratory distress. She has no wheezes. She  has no rales. She exhibits no tenderness.  Abdominal: Soft. Bowel sounds are normal. She exhibits no distension and no mass. There is no tenderness. There is no rebound and no guarding.  Genitourinary: Rectum normal, vagina normal and uterus normal. No breast swelling, tenderness, discharge or bleeding. Pelvic exam was performed with patient supine. There is no rash, tenderness or lesion on the right labia. There is no rash, tenderness or lesion on the left labia. Uterus is not enlarged and not tender. Cervix exhibits no motion tenderness, no discharge and no friability. Right adnexum displays no mass, no tenderness and no fullness. Left adnexum displays no mass, no tenderness and no fullness. No erythema or tenderness around the vagina. No vaginal discharge found.  Musculoskeletal: Normal range of motion. She exhibits no edema and no tenderness.       Right hip: She exhibits normal range of motion, normal strength and no tenderness.       Right knee: She exhibits normal range of motion and no swelling.       Left knee: She exhibits normal range of motion and no swelling.  Lymphadenopathy:    She has no cervical adenopathy.  Neurological: She is alert and oriented to person, place, and time. No cranial nerve deficit. She exhibits normal muscle tone. Coordination normal.  Skin: Skin is warm and dry. No rash noted. She is not diaphoretic. No erythema. No pallor.  Psychiatric: She has a normal mood and affect. Her behavior is normal. Judgment and thought content normal.          Assessment & Plan:

## 2012-10-31 NOTE — Assessment & Plan Note (Signed)
General medical exam including breast and pelvic exam normal today. Pap is pending. Encouraged continued efforts at healthy diet and regular physical activity. Reviewed recent labs including CBC, CMP, lipid profile which were normal. Mammogram ordered.

## 2012-10-31 NOTE — Assessment & Plan Note (Signed)
Persistent symptoms of right hip pain and bilateral knee pain. Followed by orthopedics for osteoarthritis. Pain management has been poor today. Patient is currently taking hydrocodone up to 3 times a day for severe pain. Discussed the potential risk of this medication. Will continue meloxicam 15 mg daily and hydrocodone for severe pain only. Will set up referral to the [facility] pain clinic.

## 2012-11-06 ENCOUNTER — Encounter: Payer: Self-pay | Admitting: Internal Medicine

## 2012-11-14 ENCOUNTER — Telehealth: Payer: Self-pay | Admitting: Internal Medicine

## 2012-11-15 ENCOUNTER — Encounter: Payer: Self-pay | Admitting: *Deleted

## 2012-11-15 ENCOUNTER — Other Ambulatory Visit: Payer: Self-pay | Admitting: Internal Medicine

## 2012-11-15 MED ORDER — HYDROCODONE-ACETAMINOPHEN 5-325 MG PO TABS: ORAL_TABLET | ORAL | Status: DC

## 2012-11-15 NOTE — Telephone Encounter (Signed)
Needs to come in and sign pain contract and give UDS if not done

## 2012-11-15 NOTE — Telephone Encounter (Signed)
Patient was sent to Mychart message.

## 2012-11-29 ENCOUNTER — Encounter: Payer: Self-pay | Admitting: Emergency Medicine

## 2012-12-14 ENCOUNTER — Other Ambulatory Visit: Payer: Self-pay | Admitting: Internal Medicine

## 2012-12-14 ENCOUNTER — Encounter: Payer: Self-pay | Admitting: Internal Medicine

## 2012-12-14 MED ORDER — HYDROCODONE-ACETAMINOPHEN 5-325 MG PO TABS: ORAL_TABLET | ORAL | Status: DC

## 2013-01-02 ENCOUNTER — Ambulatory Visit: Payer: Self-pay | Admitting: Pain Medicine

## 2013-01-10 ENCOUNTER — Ambulatory Visit: Payer: Self-pay | Admitting: Pain Medicine

## 2013-01-15 MED ORDER — HYDROCODONE-ACETAMINOPHEN 5-325 MG PO TABS: ORAL_TABLET | ORAL | Status: DC

## 2013-01-17 ENCOUNTER — Other Ambulatory Visit: Payer: Self-pay

## 2013-01-28 ENCOUNTER — Ambulatory Visit: Payer: Self-pay | Admitting: Pain Medicine

## 2013-02-14 ENCOUNTER — Ambulatory Visit: Payer: Self-pay | Admitting: Pain Medicine

## 2013-03-05 ENCOUNTER — Ambulatory Visit: Payer: Self-pay | Admitting: Pain Medicine

## 2013-03-25 ENCOUNTER — Ambulatory Visit: Payer: Self-pay | Admitting: Pain Medicine

## 2013-03-28 ENCOUNTER — Other Ambulatory Visit: Payer: Self-pay | Admitting: Internal Medicine

## 2013-04-04 ENCOUNTER — Ambulatory Visit: Payer: Self-pay | Admitting: Pain Medicine

## 2013-04-17 ENCOUNTER — Ambulatory Visit: Payer: Self-pay | Admitting: Pain Medicine

## 2013-05-10 ENCOUNTER — Other Ambulatory Visit: Payer: Self-pay | Admitting: Internal Medicine

## 2013-05-20 ENCOUNTER — Encounter: Payer: Self-pay | Admitting: Internal Medicine

## 2013-05-20 MED ORDER — FLUOXETINE HCL 20 MG PO TABS: ORAL_TABLET | ORAL | Status: DC

## 2013-05-21 NOTE — Telephone Encounter (Signed)
Medication was resent to pharmacy. 

## 2013-05-22 ENCOUNTER — Emergency Department: Payer: Self-pay | Admitting: Emergency Medicine

## 2013-05-28 MED ORDER — FLUOXETINE HCL 20 MG PO TABS
ORAL_TABLET | ORAL | Status: DC
Start: 2013-05-28 — End: 2013-07-18

## 2013-05-28 MED ORDER — FLUOXETINE HCL 20 MG PO TABS: ORAL_TABLET | ORAL | Status: DC

## 2013-05-29 ENCOUNTER — Other Ambulatory Visit: Payer: Self-pay | Admitting: Internal Medicine

## 2013-05-29 NOTE — Telephone Encounter (Signed)
Refill? Not on current med list 

## 2013-07-12 ENCOUNTER — Ambulatory Visit: Payer: Self-pay | Admitting: Pain Medicine

## 2013-07-18 ENCOUNTER — Other Ambulatory Visit: Payer: Self-pay | Admitting: Internal Medicine

## 2013-07-18 MED ORDER — FLUOXETINE HCL 20 MG PO TABS: ORAL_TABLET | ORAL | Status: DC

## 2013-07-25 ENCOUNTER — Ambulatory Visit: Payer: Self-pay | Admitting: Pain Medicine

## 2013-07-29 ENCOUNTER — Other Ambulatory Visit: Payer: Self-pay | Admitting: *Deleted

## 2013-08-09 ENCOUNTER — Ambulatory Visit (INDEPENDENT_AMBULATORY_CARE_PROVIDER_SITE_OTHER): Payer: 59 | Admitting: Internal Medicine

## 2013-08-09 ENCOUNTER — Encounter: Payer: Self-pay | Admitting: Internal Medicine

## 2013-08-09 VITALS — BP 104/68 | HR 64 | Temp 98.2°F | Ht 65.0 in | Wt 171.5 lb

## 2013-08-09 DIAGNOSIS — M25559 Pain in unspecified hip: Secondary | ICD-10-CM

## 2013-08-09 DIAGNOSIS — F3289 Other specified depressive episodes: Secondary | ICD-10-CM

## 2013-08-09 DIAGNOSIS — M25551 Pain in right hip: Secondary | ICD-10-CM

## 2013-08-09 DIAGNOSIS — F329 Major depressive disorder, single episode, unspecified: Secondary | ICD-10-CM

## 2013-08-09 DIAGNOSIS — F32A Depression, unspecified: Secondary | ICD-10-CM

## 2013-08-09 MED ORDER — FLUOXETINE HCL 60 MG PO TABS: 60.0000 mg | ORAL_TABLET | Freq: Every day | ORAL | Status: DC

## 2013-08-09 NOTE — Patient Instructions (Signed)
Increase Fluoxetine to 60mg  daily.  Follow up in 2-4 weeks.

## 2013-08-09 NOTE — Assessment & Plan Note (Addendum)
Symptoms recently worsening. Offered support. Will increase Fluoxetine to 60mg  daily. Recommended referral to counselor, but she prefers to have counseling with her pastor for now. Follow up 2-4 weeks and prn.  Over of which >50% spent in face-to-face contact with patient discussing plan of care

## 2013-08-09 NOTE — Progress Notes (Signed)
Pre visit review using our clinic review tool, if applicable. No additional management support is needed unless otherwise documented below in the visit note. 

## 2013-08-09 NOTE — Progress Notes (Signed)
Subjective:    Patient ID: Anne James, female    DOB: [birth year], 43 y.o.   MRN: [id number]  HPI 43YO female presents for follow up.  Depression - Difficult time recently. Husband lost job. Son had DWI. Daughter not doing well. Consistently taking Fluoxetine 40mg  daily with minimal improvement.  Chronic back pain - had 2 ESI on 5/4 with Dr. Marchia Bond. Tolerated well. Pain has been much better controlled.  Review of Systems  Constitutional: Negative for fever, chills, appetite change, fatigue and unexpected weight change.  Eyes: Negative for visual disturbance.  Respiratory: Negative for cough, shortness of breath, wheezing and stridor.   Cardiovascular: Negative for chest pain, palpitations and leg swelling.  Gastrointestinal: Negative for abdominal pain.  Genitourinary: Negative for dysuria and flank pain.  Musculoskeletal: Positive for arthralgias, back pain and myalgias. Negative for gait problem and neck pain.  Skin: Negative for color change and rash.  Neurological: Negative for dizziness and headaches.  Hematological: Negative for adenopathy. Does not bruise/bleed easily.  Psychiatric/Behavioral: Positive for sleep disturbance and dysphoric mood. Negative for suicidal ideas. The patient is nervous/anxious.        Objective:    BP 104/68  Pulse 64  Temp(Src) 98.2 F (36.8 C) (Oral)  Ht 5\' 5"  (1.651 m)  Wt 171 lb 8 oz (77.792 kg)  BMI 28.54 kg/m2  SpO2 95% Physical Exam  Constitutional: She is oriented to person, place, and time. She appears well-developed and well-nourished. No distress.  HENT:  Head: Normocephalic and atraumatic.  Right Ear: External ear normal.  Left Ear: External ear normal.  Nose: Nose normal.  Mouth/Throat: Oropharynx is clear and moist. No oropharyngeal exudate.  Eyes: Conjunctivae are normal. Pupils are equal, round, and reactive to light. Right eye exhibits no discharge. Left eye exhibits no discharge. No scleral icterus.  Neck: Normal range  of motion. Neck supple. No tracheal deviation present. No thyromegaly present.  Cardiovascular: Normal rate, regular rhythm, normal heart sounds and intact distal pulses.  Exam reveals no gallop and no friction rub.   No murmur heard. Pulmonary/Chest: Effort normal and breath sounds normal. No accessory muscle usage. Not tachypneic. No respiratory distress. She has no decreased breath sounds. She has no wheezes. She has no rhonchi. She has no rales. She exhibits no tenderness.  Musculoskeletal: Normal range of motion. She exhibits no edema and no tenderness.  Lymphadenopathy:    She has no cervical adenopathy.  Neurological: She is alert and oriented to person, place, and time. No cranial nerve deficit. She exhibits normal muscle tone. Coordination normal.  Skin: Skin is warm and dry. No rash noted. She is not diaphoretic. No erythema. No pallor.  Psychiatric: Her speech is normal and behavior is normal. Judgment and thought content normal. Cognition and memory are normal. She exhibits a depressed mood. She expresses no suicidal ideation.          Assessment & Plan:   Problem List Items Addressed This Visit     Unprioritized   Depression - Primary     Symptoms recently worsening. Offered support. Will increase Fluoxetine to 60mg  daily. Recommended referral to counselor, but she prefers to have counseling with her pastor for now. Follow up 2-4 weeks and prn.  Over of which >50% spent in face-to-face contact with patient discussing plan of care     Relevant Medications      FLUoxetine 60 MG TABS   Right hip pain     Symptoms improved after recent ESI. Continue to  follow with Dr. Marchia BondNavarra.        Return in about 3 weeks (around 08/30/2013) for Physical.

## 2013-08-09 NOTE — Assessment & Plan Note (Signed)
Symptoms improved after recent ESI. Continue to follow with Dr. Marchia Bond.

## 2013-08-19 ENCOUNTER — Ambulatory Visit: Payer: Self-pay | Admitting: Pain Medicine

## 2013-09-16 ENCOUNTER — Ambulatory Visit (INDEPENDENT_AMBULATORY_CARE_PROVIDER_SITE_OTHER): Payer: 59 | Admitting: Internal Medicine

## 2013-09-16 ENCOUNTER — Encounter: Payer: Self-pay | Admitting: Internal Medicine

## 2013-09-16 VITALS — BP 108/70 | HR 59 | Temp 98.2°F | Ht 64.75 in | Wt 168.5 lb

## 2013-09-16 DIAGNOSIS — F329 Major depressive disorder, single episode, unspecified: Secondary | ICD-10-CM

## 2013-09-16 DIAGNOSIS — E669 Obesity, unspecified: Secondary | ICD-10-CM

## 2013-09-16 DIAGNOSIS — Z0000 Encounter for general adult medical examination without abnormal findings: Secondary | ICD-10-CM

## 2013-09-16 DIAGNOSIS — F3289 Other specified depressive episodes: Secondary | ICD-10-CM

## 2013-09-16 DIAGNOSIS — F32A Depression, unspecified: Secondary | ICD-10-CM

## 2013-09-16 MED ORDER — FLUOXETINE HCL 40 MG PO CAPS: 40.0000 mg | ORAL_CAPSULE | Freq: Every day | ORAL | Status: DC

## 2013-09-16 NOTE — Assessment & Plan Note (Signed)
General medical exam normal today including breast exam. PAP and pelvic deferred as PAP normal, HPV neg in 2014. Will repeat PAP in 2017. Mammogram ordered. Labs today including CBC, CMP, lipids, TSH. Encouraged healthy diet and exercise. Follow up in 6 months and prn.

## 2013-09-16 NOTE — Addendum Note (Signed)
Addended by: Montine CircleMALDONADO, Chanita Boden D on: 09/16/2013 10:06 AM   Modules accepted: Orders

## 2013-09-16 NOTE — Patient Instructions (Signed)
It was nice to see you today.  You are doing well.  Continue healthy diet and set a goal of exercising 40 minutes three times per week.  

## 2013-09-16 NOTE — Assessment & Plan Note (Signed)
Wt Readings from Last 3 Encounters:  09/16/13 168 lb 8 oz (76.431 kg)  08/09/13 171 lb 8 oz (77.792 kg)  10/31/12 170 lb (77.111 kg)   Body mass index is 28.24 kg/(m^2). Congratulated pt on weight loss. Encouraged continued healthy diet and exercise.

## 2013-09-16 NOTE — Progress Notes (Signed)
Subjective:    Patient ID: Anne FlavorsLori D Harrigan, female    DOB: [birth year], 43 y.o.   MRN: [id number]  HPI 43YO female presents for annual exam.  Feeling well. Symptoms of depression have improved. Would like to go back to 40mg  dosing of Fluoxetine.  Following healthy diet and exercising at local gym 4-6 days per week. No new concerns today.  Scheduled for nerve block in lumbar spine with Dr. Marchia BondNavarra this week.  Review of Systems  Constitutional: Negative for fever, chills, appetite change, fatigue and unexpected weight change.  Eyes: Negative for visual disturbance.  Respiratory: Negative for shortness of breath.   Cardiovascular: Negative for chest pain and leg swelling.  Gastrointestinal: Negative for abdominal pain.  Musculoskeletal: Positive for arthralgias, back pain and myalgias.  Skin: Negative for color change and rash.  Hematological: Negative for adenopathy. Does not bruise/bleed easily.  Psychiatric/Behavioral: Negative for sleep disturbance and dysphoric mood. The patient is not nervous/anxious.        Objective:    BP 108/70  Pulse 59  Temp(Src) 98.2 F (36.8 C) (Oral)  Ht 5' 4.75" (1.645 m)  Wt 168 lb 8 oz (76.431 kg)  BMI 28.24 kg/m2  SpO2 99% Physical Exam  Constitutional: She is oriented to person, place, and time. She appears well-developed and well-nourished. No distress.  HENT:  Head: Normocephalic and atraumatic.  Right Ear: External ear normal.  Left Ear: External ear normal.  Nose: Nose normal.  Mouth/Throat: Oropharynx is clear and moist. No oropharyngeal exudate.  Eyes: Conjunctivae are normal. Pupils are equal, round, and reactive to light. Right eye exhibits no discharge. Left eye exhibits no discharge. No scleral icterus.  Neck: Normal range of motion. Neck supple. No tracheal deviation present. No thyromegaly present.  Cardiovascular: Normal rate, regular rhythm, normal heart sounds and intact distal pulses.  Exam reveals no gallop and no  friction rub.   No murmur heard. Pulmonary/Chest: Effort normal and breath sounds normal. No accessory muscle usage. Not tachypneic. No respiratory distress. She has no decreased breath sounds. She has no wheezes. She has no rhonchi. She has no rales. She exhibits no tenderness. Right breast exhibits no inverted nipple, no mass, no nipple discharge, no skin change and no tenderness. Left breast exhibits no inverted nipple, no mass, no nipple discharge, no skin change and no tenderness. Breasts are symmetrical.  Abdominal: Soft. Bowel sounds are normal. She exhibits no distension and no mass. There is no tenderness. There is no rebound and no guarding.  Musculoskeletal: Normal range of motion. She exhibits no edema and no tenderness.  Lymphadenopathy:    She has no cervical adenopathy.  Neurological: She is alert and oriented to person, place, and time. No cranial nerve deficit. She exhibits normal muscle tone. Coordination normal.  Skin: Skin is warm and dry. No rash noted. She is not diaphoretic. No erythema. No pallor.  Psychiatric: She has a normal mood and affect. Her behavior is normal. Judgment and thought content normal.          Assessment & Plan:   Problem List Items Addressed This Visit     Unprioritized   Depression     Will reduce dose on Fluoxetine back to 40mg  daily. Pt will call if any problems/concerns with this dosing.    Relevant Medications      FLUoxetine (PROZAC) 40 MG capsule   Obesity (BMI 30-39.9)      Wt Readings from Last 3 Encounters:  09/16/13 168 lb 8 oz (76.431 kg)  08/09/13 171 lb 8 oz (77.792 kg)  10/31/12 170 lb (77.111 kg)   Body mass index is 28.24 kg/(m^2). Congratulated pt on weight loss. Encouraged continued healthy diet and exercise.    Routine general medical examination at a health care facility - Primary     General medical exam normal today including breast exam. PAP and pelvic deferred as PAP normal, HPV neg in 2014. Will repeat PAP in  2017. Mammogram ordered. Labs today including CBC, CMP, lipids, TSH. Encouraged healthy diet and exercise. Follow up in 6 months and prn.    Relevant Orders      MM Digital Screening      CBC with Differential      Comprehensive metabolic panel      Lipid panel      Microalbumin / creatinine urine ratio      Vit D  25 hydroxy (rtn osteoporosis monitoring)      TSH       Return in about 6 months (around 03/19/2014) for Recheck.

## 2013-09-16 NOTE — Progress Notes (Signed)
Pre visit review using our clinic review tool, if applicable. No additional management support is needed unless otherwise documented below in the visit note. 

## 2013-09-16 NOTE — Assessment & Plan Note (Signed)
Will reduce dose on Fluoxetine back to 40mg  daily. Pt will call if any problems/concerns with this dosing.

## 2013-09-17 ENCOUNTER — Ambulatory Visit: Payer: Self-pay | Admitting: Pain Medicine

## 2013-09-17 LAB — CBC WITH DIFFERENTIAL/PLATELET
BASOS: 0 %
Basophils Absolute: 0 10*3/uL (ref 0.0–0.2)
EOS ABS: 0.3 10*3/uL (ref 0.0–0.4)
EOS: 4 %
HCT: 35.4 % (ref 34.0–46.6)
HEMOGLOBIN: 11.7 g/dL (ref 11.1–15.9)
Immature Grans (Abs): 0 10*3/uL (ref 0.0–0.1)
Immature Granulocytes: 0 %
LYMPHS: 28 %
Lymphocytes Absolute: 2.1 10*3/uL (ref 0.7–3.1)
MCH: 27.3 pg (ref 26.6–33.0)
MCHC: 33.1 g/dL (ref 31.5–35.7)
MCV: 83 fL (ref 79–97)
Monocytes Absolute: 0.7 10*3/uL (ref 0.1–0.9)
Monocytes: 9 %
NEUTROS ABS: 4.5 10*3/uL (ref 1.4–7.0)
Neutrophils Relative %: 59 %
RBC: 4.28 x10E6/uL (ref 3.77–5.28)
RDW: 15.2 % (ref 12.3–15.4)
WBC: 7.5 10*3/uL (ref 3.4–10.8)

## 2013-09-17 LAB — COMPREHENSIVE METABOLIC PANEL
ALT: 19 IU/L (ref 0–32)
AST: 28 IU/L (ref 0–40)
Albumin/Globulin Ratio: 2.3 (ref 1.1–2.5)
Albumin: 4.2 g/dL (ref 3.5–5.5)
Alkaline Phosphatase: 58 IU/L (ref 39–117)
BILIRUBIN TOTAL: 0.4 mg/dL (ref 0.0–1.2)
BUN/Creatinine Ratio: 18 (ref 9–23)
BUN: 14 mg/dL (ref 6–24)
CALCIUM: 9.6 mg/dL (ref 8.7–10.2)
CO2: 23 mmol/L (ref 18–29)
Chloride: 101 mmol/L (ref 97–108)
Creatinine, Ser: 0.78 mg/dL (ref 0.57–1.00)
GFR, EST AFRICAN AMERICAN: 106 mL/min/{1.73_m2} (ref >59)
GFR, EST NON AFRICAN AMERICAN: 92 mL/min/{1.73_m2} (ref >59)
GLUCOSE: 79 mg/dL (ref 65–99)
Globulin, Total: 1.8 g/dL (ref 1.5–4.5)
POTASSIUM: 4.8 mmol/L (ref 3.5–5.2)
Sodium: 140 mmol/L (ref 134–144)
Total Protein: 6 g/dL (ref 6.0–8.5)

## 2013-09-17 LAB — VITAMIN D 25 HYDROXY (VIT D DEFICIENCY, FRACTURES): VIT D 25 HYDROXY: 60.3 ng/mL (ref 30.0–100.0)

## 2013-09-17 LAB — TSH: TSH: 2.47 u[IU]/mL (ref 0.450–4.500)

## 2013-09-17 LAB — MICROALBUMIN / CREATININE URINE RATIO
Creatinine, Ur: 16.4 mg/dL (ref 15.0–278.0)
MICROALB/CREAT RATIO: <18.3 mg/g creat (ref 0.0–30.0)
Microalbumin, Urine: <3 ug/mL (ref 0.0–17.0)

## 2013-10-11 ENCOUNTER — Encounter: Payer: Self-pay | Admitting: *Deleted

## 2013-10-11 LAB — HM MAMMOGRAPHY: HM MAMMO: NEGATIVE

## 2013-10-18 ENCOUNTER — Ambulatory Visit: Payer: Self-pay | Admitting: Pain Medicine

## 2014-01-31 ENCOUNTER — Ambulatory Visit: Payer: Self-pay | Admitting: Pain Medicine

## 2014-07-02 ENCOUNTER — Ambulatory Visit (INDEPENDENT_AMBULATORY_CARE_PROVIDER_SITE_OTHER): Payer: Managed Care, Other (non HMO) | Admitting: Internal Medicine

## 2014-07-02 ENCOUNTER — Encounter: Payer: Self-pay | Admitting: Internal Medicine

## 2014-07-02 VITALS — BP 111/73 | HR 64 | Temp 98.3°F | Ht 65.0 in | Wt 175.0 lb

## 2014-07-02 DIAGNOSIS — F329 Major depressive disorder, single episode, unspecified: Secondary | ICD-10-CM

## 2014-07-02 DIAGNOSIS — M25551 Pain in right hip: Secondary | ICD-10-CM

## 2014-07-02 DIAGNOSIS — R5382 Chronic fatigue, unspecified: Secondary | ICD-10-CM | POA: Diagnosis not present

## 2014-07-02 DIAGNOSIS — F32A Depression, unspecified: Secondary | ICD-10-CM

## 2014-07-02 LAB — CBC WITH DIFFERENTIAL/PLATELET
Basophils Absolute: 0 10*3/uL (ref 0.0–0.1)
Basophils Relative: 0.4 % (ref 0.0–3.0)
Eosinophils Absolute: 0.2 10*3/uL (ref 0.0–0.7)
Eosinophils Relative: 2.8 % (ref 0.0–5.0)
HCT: 37.7 % (ref 36.0–46.0)
Hemoglobin: 12.5 g/dL (ref 12.0–15.0)
Lymphocytes Relative: 29.4 % (ref 12.0–46.0)
Lymphs Abs: 1.8 10*3/uL (ref 0.7–4.0)
MCHC: 33.3 g/dL (ref 30.0–36.0)
MCV: 85 fl (ref 78.0–100.0)
Monocytes Absolute: 0.6 10*3/uL (ref 0.1–1.0)
Monocytes Relative: 8.9 % (ref 3.0–12.0)
Neutro Abs: 3.7 10*3/uL (ref 1.4–7.7)
Neutrophils Relative %: 58.5 % (ref 43.0–77.0)
Platelets: 341 10*3/uL (ref 150.0–400.0)
RBC: 4.43 Mil/uL (ref 3.87–5.11)
RDW: 15.4 % (ref 11.5–15.5)
WBC: 6.3 10*3/uL (ref 4.0–10.5)

## 2014-07-02 LAB — COMPREHENSIVE METABOLIC PANEL
ALT: 17 U/L (ref 0–35)
AST: 27 U/L (ref 0–37)
Albumin: 4.2 g/dL (ref 3.5–5.2)
Alkaline Phosphatase: 59 U/L (ref 39–117)
BILIRUBIN TOTAL: 0.4 mg/dL (ref 0.2–1.2)
BUN: 11 mg/dL (ref 6–23)
CHLORIDE: 102 meq/L (ref 96–112)
CO2: 31 meq/L (ref 19–32)
Calcium: 9.3 mg/dL (ref 8.4–10.5)
Creatinine, Ser: 0.78 mg/dL (ref 0.40–1.20)
GFR: 84.53 mL/min (ref >60.00)
Glucose, Bld: 87 mg/dL (ref 70–99)
Potassium: 4.4 mEq/L (ref 3.5–5.1)
Sodium: 137 mEq/L (ref 135–145)
Total Protein: 6.6 g/dL (ref 6.0–8.3)

## 2014-07-02 LAB — VITAMIN B12: Vitamin B-12: >1500 pg/mL — ABNORMAL HIGH (ref 211–911)

## 2014-07-02 LAB — TSH: TSH: 1.89 u[IU]/mL (ref 0.35–4.50)

## 2014-07-02 LAB — VITAMIN D 25 HYDROXY (VIT D DEFICIENCY, FRACTURES): VITD: 78.58 ng/mL (ref 30.00–100.00)

## 2014-07-02 MED ORDER — CELECOXIB 200 MG PO CAPS: ORAL_CAPSULE | ORAL | Status: DC

## 2014-07-02 MED ORDER — FLUOXETINE HCL 40 MG PO CAPS: 40.0000 mg | ORAL_CAPSULE | Freq: Every day | ORAL | Status: DC

## 2014-07-02 MED ORDER — BUPROPION HCL ER (XL) 150 MG PO TB24: 150.0000 mg | ORAL_TABLET | Freq: Every day | ORAL | Status: DC

## 2014-07-02 NOTE — Progress Notes (Signed)
Subjective:    Patient ID: Anne James, female    DOB: 01-05-1971, 44 y.o.   MRN: [id number]  HPI  44YO female presents for follow up.  Feels exhausted for the last few months. Feels that she has "aged 44 years." Husband recently had a heart attack. Daughter is pregnant. Not sleeping well. Goes to bed between 8-10pm. Wakes at 4-6am. However not restful sleep. No longer going to the gym. Husband also started 3rd shift, which interrupts her sleep.  Continues to follow up with Dr. Marchia BondNavarra in pain management. Her insurance has denied follow up ESI.  Some mild constipation. No blood in stool.  Past medical, surgical, family and social history per today's encounter.  Review of Systems  Constitutional: Positive for fatigue. Negative for fever, chills, appetite change and unexpected weight change.  Eyes: Negative for visual disturbance.  Respiratory: Negative for shortness of breath.   Cardiovascular: Negative for chest pain and leg swelling.  Gastrointestinal: Positive for constipation. Negative for nausea, vomiting, abdominal pain and diarrhea.  Musculoskeletal: Positive for myalgias and arthralgias.  Skin: Negative for color change and rash.  Hematological: Negative for adenopathy. Does not bruise/bleed easily.  Psychiatric/Behavioral: Positive for sleep disturbance and dysphoric mood. The patient is nervous/anxious.        Objective:    BP 111/73 mmHg  Pulse 64  Temp(Src) 98.3 F (36.8 C) (Oral)  Ht 5\' 5"  (1.651 m)  Wt 175 lb (79.379 kg)  BMI 29.12 kg/m2  SpO2 99% Physical Exam  Constitutional: She is oriented to person, place, and time. She appears well-developed and well-nourished. No distress.  HENT:  Head: Normocephalic and atraumatic.  Right Ear: External ear normal.  Left Ear: External ear normal.  Nose: Nose normal.  Mouth/Throat: Oropharynx is clear and moist. No oropharyngeal exudate.  Eyes: Conjunctivae and EOM are normal. Pupils are equal, round, and  reactive to light. Right eye exhibits no discharge. Left eye exhibits no discharge. No scleral icterus.  Neck: Normal range of motion. Neck supple. No tracheal deviation present. No thyromegaly present.  Cardiovascular: Normal rate, regular rhythm, normal heart sounds and intact distal pulses.  Exam reveals no gallop and no friction rub.   No murmur heard. Pulmonary/Chest: Effort normal and breath sounds normal. No respiratory distress. She has no wheezes. She has no rales. She exhibits no tenderness.  Abdominal: Soft. Bowel sounds are normal. She exhibits no distension and no mass. There is no tenderness. There is no rebound and no guarding.  Musculoskeletal: Normal range of motion. She exhibits no edema or tenderness.  Lymphadenopathy:    She has no cervical adenopathy.  Neurological: She is alert and oriented to person, place, and time. No cranial nerve deficit. She exhibits normal muscle tone. Coordination normal.  Skin: Skin is warm and dry. No rash noted. She is not diaphoretic. No erythema. No pallor.  Psychiatric: Her speech is normal and behavior is normal. Judgment and thought content normal. Her mood appears anxious. She exhibits a depressed mood. She expresses no suicidal ideation.          Assessment & Plan:   Problem List Items Addressed This Visit      Unprioritized   Chronic fatigue - Primary    Recent fatigue likely secondary to increased anxiety and depression, poor sleep as noted. Will also check TSH, CBC, CMP, B12 with labs.       Relevant Orders   CBC with Differential/Platelet   Comprehensive metabolic panel   TSH   B12  Vitamin D (25 hydroxy)   Depression    Worsening symptoms of depression with stressors at home. Will add Wellbutrin to Fluoxetine. Follow up in 4 weeks and prn.      Relevant Medications   buPROPion (WELLBUTRIN XL) 150 MG 24 hr tablet   FLUoxetine (PROZAC) 40 MG capsule   Right hip pain    Chronic right hip pain. Had improvement with  ESI, but insurance has refused second dose. Will continue to follow up with Dr. Marchia Bond.          Return in about 4 weeks (around 07/30/2014) for Recheck.

## 2014-07-02 NOTE — Assessment & Plan Note (Signed)
Chronic right hip pain. Had improvement with ESI, but insurance has refused second dose. Will continue to follow up with Dr. Marchia BondNavarra.

## 2014-07-02 NOTE — Progress Notes (Signed)
Pre visit review using our clinic review tool, if applicable. No additional management support is needed unless otherwise documented below in the visit note. 

## 2014-07-02 NOTE — Patient Instructions (Signed)
Labs today.  Add Wellbutrin 150mg  daily in the morning.   Continue Fluoxetine.

## 2014-07-02 NOTE — Assessment & Plan Note (Signed)
Worsening symptoms of depression with stressors at home. Will add Wellbutrin to Fluoxetine. Follow up in 4 weeks and prn.

## 2014-07-02 NOTE — Assessment & Plan Note (Signed)
Recent fatigue likely secondary to increased anxiety and depression, poor sleep as noted. Will also check TSH, CBC, CMP, B12 with labs.

## 2014-08-01 ENCOUNTER — Ambulatory Visit: Payer: Managed Care, Other (non HMO) | Admitting: Internal Medicine

## 2014-09-02 ENCOUNTER — Other Ambulatory Visit: Payer: Self-pay | Admitting: *Deleted

## 2014-09-02 DIAGNOSIS — F32A Depression, unspecified: Secondary | ICD-10-CM

## 2014-09-02 DIAGNOSIS — F329 Major depressive disorder, single episode, unspecified: Secondary | ICD-10-CM

## 2014-09-02 MED ORDER — BUPROPION HCL ER (XL) 150 MG PO TB24: 150.0000 mg | ORAL_TABLET | Freq: Every day | ORAL | Status: DC

## 2014-09-29 ENCOUNTER — Encounter: Payer: Managed Care, Other (non HMO) | Admitting: Internal Medicine

## 2014-10-06 ENCOUNTER — Encounter: Payer: Self-pay | Admitting: Internal Medicine

## 2014-10-06 ENCOUNTER — Ambulatory Visit (INDEPENDENT_AMBULATORY_CARE_PROVIDER_SITE_OTHER): Payer: Managed Care, Other (non HMO) | Admitting: Internal Medicine

## 2014-10-06 VITALS — BP 103/66 | HR 71 | Temp 97.8°F | Ht 65.0 in | Wt 181.5 lb

## 2014-10-06 DIAGNOSIS — F329 Major depressive disorder, single episode, unspecified: Secondary | ICD-10-CM

## 2014-10-06 DIAGNOSIS — F32A Depression, unspecified: Secondary | ICD-10-CM

## 2014-10-06 DIAGNOSIS — Z0000 Encounter for general adult medical examination without abnormal findings: Secondary | ICD-10-CM | POA: Diagnosis not present

## 2014-10-06 MED ORDER — BUPROPION HCL ER (XL) 150 MG PO TB24: 150.0000 mg | ORAL_TABLET | Freq: Every day | ORAL | Status: DC

## 2014-10-06 MED ORDER — CELECOXIB 200 MG PO CAPS: ORAL_CAPSULE | ORAL | Status: DC

## 2014-10-06 NOTE — Addendum Note (Signed)
Addended by: Cristela Blue on: 10/06/2014 08:42 AM   Modules accepted: Kipp Brood

## 2014-10-06 NOTE — Progress Notes (Signed)
Subjective:    Patient ID: Anne James, female    DOB: [birth year], 44 y.o.   MRN: [id number]  HPI  44YO female presents for annual exam.  Having cortisone and Synvisc injections in both knees with ortho. Tolerating well. Exercise has been limited. Aside from this, feeling well. Has new grandchild at home.   Wt Readings from Last 3 Encounters:  10/06/14 181 lb 8 oz (82.328 kg)  07/02/14 175 lb (79.379 kg)  09/16/13 168 lb 8 oz (76.431 kg)     Past medical, surgical, family and social history per today's encounter.  Review of Systems  Constitutional: Negative for fever, chills, appetite change, fatigue and unexpected weight change.  Eyes: Negative for visual disturbance.  Respiratory: Negative for shortness of breath.   Cardiovascular: Negative for chest pain and leg swelling.  Gastrointestinal: Negative for nausea, vomiting, abdominal pain, diarrhea and constipation.  Musculoskeletal: Positive for myalgias, joint swelling and arthralgias.  Skin: Negative for color change and rash.  Hematological: Negative for adenopathy. Does not bruise/bleed easily.  Psychiatric/Behavioral: Negative for dysphoric mood. The patient is not nervous/anxious.        Objective:    BP 103/66 mmHg  Pulse 71  Temp(Src) 97.8 F (36.6 C) (Oral)  Ht 5\' 5"  (1.651 m)  Wt 181 lb 8 oz (82.328 kg)  BMI 30.20 kg/m2  SpO2 98% Physical Exam  Constitutional: She is oriented to person, place, and time. She appears well-developed and well-nourished. No distress.  HENT:  Head: Normocephalic and atraumatic.  Right Ear: External ear normal.  Left Ear: External ear normal.  Nose: Nose normal.  Mouth/Throat: Oropharynx is clear and moist. No oropharyngeal exudate.  Eyes: Conjunctivae are normal. Pupils are equal, round, and reactive to light. Right eye exhibits no discharge. Left eye exhibits no discharge. No scleral icterus.  Neck: Normal range of motion. Neck supple. No tracheal deviation present. No  thyromegaly present.  Cardiovascular: Normal rate, regular rhythm, normal heart sounds and intact distal pulses.  Exam reveals no gallop and no friction rub.   No murmur heard. Pulmonary/Chest: Effort normal and breath sounds normal. No accessory muscle usage. No tachypnea. No respiratory distress. She has no decreased breath sounds. She has no wheezes. She has no rales. She exhibits no tenderness. Right breast exhibits no inverted nipple, no mass, no nipple discharge, no skin change and no tenderness. Left breast exhibits no inverted nipple, no mass, no nipple discharge, no skin change and no tenderness. Breasts are symmetrical.  Abdominal: Soft. Bowel sounds are normal. She exhibits no distension and no mass. There is no tenderness. There is no rebound and no guarding.  Musculoskeletal: Normal range of motion. She exhibits no edema or tenderness.  Lymphadenopathy:    She has no cervical adenopathy.  Neurological: She is alert and oriented to person, place, and time. No cranial nerve deficit. She exhibits normal muscle tone. Coordination normal.  Skin: Skin is warm and dry. No rash noted. She is not diaphoretic. No erythema. No pallor.  Psychiatric: She has a normal mood and affect. Her behavior is normal. Judgment and thought content normal.          Assessment & Plan:   Problem List Items Addressed This Visit      Unprioritized   Depression   Relevant Medications   buPROPion (WELLBUTRIN XL) 150 MG 24 hr tablet   Routine general medical examination at a health care facility - Primary    General medical exam including breast exam normal today.  PAP and pelvic deferred as PAP normal, HPV neg in 2014. Mammogram ordered. Immunizations are UTD. Labs as ordered. Follow up in 6 months and prn. Encouraged healthy diet and exercise.      Relevant Orders   TSH   Comprehensive metabolic panel   Hemoglobin A1c   Lipid panel   Microalbumin / creatinine urine ratio   CBC with  Differential/Platelet   Vit D  25 hydroxy (rtn osteoporosis monitoring)   MM Digital Screening       Return in about 6 months (around 04/08/2015) for Recheck.

## 2014-10-06 NOTE — Patient Instructions (Signed)

## 2014-10-06 NOTE — Assessment & Plan Note (Signed)
General medical exam including breast exam normal today. PAP and pelvic deferred as PAP normal, HPV neg in 2014. Mammogram ordered. Immunizations are UTD. Labs as ordered. Follow up in 6 months and prn. Encouraged healthy diet and exercise.

## 2014-10-06 NOTE — Progress Notes (Signed)
Pre visit review using our clinic review tool, if applicable. No additional management support is needed unless otherwise documented below in the visit note. 

## 2014-10-07 LAB — CBC WITH DIFFERENTIAL/PLATELET
BASOS ABS: 0 10*3/uL (ref 0.0–0.2)
Basos: 0 %
EOS (ABSOLUTE): 0.3 10*3/uL (ref 0.0–0.4)
EOS: 4 %
Hematocrit: 37.4 % (ref 34.0–46.6)
Hemoglobin: 12 g/dL (ref 11.1–15.9)
IMMATURE GRANS (ABS): 0 10*3/uL (ref 0.0–0.1)
Immature Granulocytes: 0 %
LYMPHS ABS: 2.2 10*3/uL (ref 0.7–3.1)
Lymphs: 35 %
MCH: 27.5 pg (ref 26.6–33.0)
MCHC: 32.1 g/dL (ref 31.5–35.7)
MCV: 86 fL (ref 79–97)
MONOCYTES: 10 %
Monocytes Absolute: 0.6 10*3/uL (ref 0.1–0.9)
Neutrophils Absolute: 3.2 10*3/uL (ref 1.4–7.0)
Neutrophils: 51 %
Platelets: 373 10*3/uL (ref 150–379)
RBC: 4.36 x10E6/uL (ref 3.77–5.28)
RDW: 15 % (ref 12.3–15.4)
WBC: 6.2 10*3/uL (ref 3.4–10.8)

## 2014-10-07 LAB — COMPREHENSIVE METABOLIC PANEL
ALBUMIN: 4.1 g/dL (ref 3.5–5.5)
ALT: 13 IU/L (ref 0–32)
AST: 17 IU/L (ref 0–40)
Albumin/Globulin Ratio: 2.2 (ref 1.1–2.5)
Alkaline Phosphatase: 51 IU/L (ref 39–117)
BUN/Creatinine Ratio: 20 (ref 9–23)
BUN: 13 mg/dL (ref 6–24)
Bilirubin Total: 0.4 mg/dL (ref 0.0–1.2)
CHLORIDE: 95 mmol/L — AB (ref 97–108)
CO2: 26 mmol/L (ref 18–29)
CREATININE: 0.65 mg/dL (ref 0.57–1.00)
Calcium: 9 mg/dL (ref 8.7–10.2)
GFR calc Af Amer: 123 mL/min/{1.73_m2} (ref >59)
GFR, EST NON AFRICAN AMERICAN: 107 mL/min/{1.73_m2} (ref >59)
Globulin, Total: 1.9 g/dL (ref 1.5–4.5)
Glucose: 78 mg/dL (ref 65–99)
Potassium: 5.3 mmol/L — ABNORMAL HIGH (ref 3.5–5.2)
Sodium: 136 mmol/L (ref 134–144)
Total Protein: 6 g/dL (ref 6.0–8.5)

## 2014-10-07 LAB — HEMOGLOBIN A1C
Est. average glucose Bld gHb Est-mCnc: 100 mg/dL
HEMOGLOBIN A1C: 5.1 % (ref 4.8–5.6)

## 2014-10-07 LAB — LIPID PANEL
Chol/HDL Ratio: 1.9 ratio units (ref 0.0–4.4)
Cholesterol, Total: 207 mg/dL — ABNORMAL HIGH (ref 100–199)
HDL: 108 mg/dL (ref >39)
LDL Calculated: 86 mg/dL (ref 0–99)
TRIGLYCERIDES: 63 mg/dL (ref 0–149)
VLDL Cholesterol Cal: 13 mg/dL (ref 5–40)

## 2014-10-07 LAB — MICROALBUMIN / CREATININE URINE RATIO
Creatinine, Urine: 26.8 mg/dL
MICROALB/CREAT RATIO: <11.2 mg/g creat (ref 0.0–30.0)
Microalbumin, Urine: <3 ug/mL

## 2014-10-07 LAB — TSH: TSH: 2.75 u[IU]/mL (ref 0.450–4.500)

## 2014-10-07 LAB — VITAMIN D 25 HYDROXY (VIT D DEFICIENCY, FRACTURES): Vit D, 25-Hydroxy: 41.9 ng/mL (ref 30.0–100.0)

## 2014-10-09 ENCOUNTER — Telehealth: Payer: Self-pay | Admitting: *Deleted

## 2014-10-09 ENCOUNTER — Other Ambulatory Visit (INDEPENDENT_AMBULATORY_CARE_PROVIDER_SITE_OTHER): Payer: Managed Care, Other (non HMO)

## 2014-10-09 DIAGNOSIS — E875 Hyperkalemia: Secondary | ICD-10-CM | POA: Diagnosis not present

## 2014-10-09 LAB — BASIC METABOLIC PANEL
BUN: 18 mg/dL (ref 6–23)
CALCIUM: 8.5 mg/dL (ref 8.4–10.5)
CO2: 29 mEq/L (ref 19–32)
CREATININE: 0.73 mg/dL (ref 0.40–1.20)
Chloride: 102 mEq/L (ref 96–112)
GFR: 91.14 mL/min (ref >60.00)
GLUCOSE: 74 mg/dL (ref 70–99)
Potassium: 4.8 mEq/L (ref 3.5–5.1)
Sodium: 136 mEq/L (ref 135–145)

## 2014-10-09 NOTE — Telephone Encounter (Signed)
Labs and dx?  

## 2014-10-09 NOTE — Telephone Encounter (Signed)
Repeat BMP hyperkalemia

## 2015-06-08 ENCOUNTER — Encounter: Payer: Self-pay | Admitting: Internal Medicine

## 2015-06-08 ENCOUNTER — Ambulatory Visit (INDEPENDENT_AMBULATORY_CARE_PROVIDER_SITE_OTHER): Payer: Managed Care, Other (non HMO) | Admitting: Internal Medicine

## 2015-06-08 VITALS — BP 112/78 | HR 68 | Temp 97.9°F | Wt 188.4 lb

## 2015-06-08 DIAGNOSIS — F329 Major depressive disorder, single episode, unspecified: Secondary | ICD-10-CM | POA: Insufficient documentation

## 2015-06-08 DIAGNOSIS — F419 Anxiety disorder, unspecified: Secondary | ICD-10-CM | POA: Insufficient documentation

## 2015-06-08 DIAGNOSIS — M170 Bilateral primary osteoarthritis of knee: Secondary | ICD-10-CM | POA: Diagnosis not present

## 2015-06-08 DIAGNOSIS — Z01818 Encounter for other preprocedural examination: Secondary | ICD-10-CM

## 2015-06-08 DIAGNOSIS — Z1239 Encounter for other screening for malignant neoplasm of breast: Secondary | ICD-10-CM

## 2015-06-08 DIAGNOSIS — F411 Generalized anxiety disorder: Secondary | ICD-10-CM | POA: Diagnosis not present

## 2015-06-08 MED ORDER — FLUOXETINE HCL 40 MG PO CAPS: 40.0000 mg | ORAL_CAPSULE | Freq: Every day | ORAL | Status: DC

## 2015-06-08 NOTE — Progress Notes (Signed)
Pre visit review using our clinic review tool, if applicable. No additional management support is needed unless otherwise documented below in the visit note. 

## 2015-06-08 NOTE — Assessment & Plan Note (Signed)
Bilateral OA. Scheduled for left knee replacement next month.Will follow.

## 2015-06-08 NOTE — Assessment & Plan Note (Signed)
Worsening anxiety with upcoming surgery. Will hold Wellbutrin. Discussed potential change in Fluoxetine to Cymbalta. Will hold until after surgery.

## 2015-06-08 NOTE — Progress Notes (Signed)
Subjective:    Patient ID: Anne James, female    DOB: January 25, 1971, 45 y.o.   MRN: [id number]  HPI  45YO female presents for medical clearance.  Scheduled to have left knee replacement 4/17.  Generally has been healthy. Had DVT/PE in 2002. Treated for a few months with coumadin, then off. No issues after this. No previous issues with anesthesia except for nausea. No recent illnesses. No issues with excessive bleeding during surgery.  Having some worsening anxiety with upcoming surgery. Initially, had some improvement with Wellbutrin, now seems worse.  Wt Readings from Last 3 Encounters:  06/08/15 188 lb 6 oz (85.446 kg)  10/06/14 181 lb 8 oz (82.328 kg)  07/02/14 175 lb (79.379 kg)   BP Readings from Last 3 Encounters:  06/08/15 112/78  10/06/14 103/66  07/02/14 111/73    Past Medical History  Diagnosis Date  . Chicken pox   . Depression   . Eating disorder   . Phlebitis   . 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 Arthritis     bilateral, right>left   Family History  Problem Relation Age of Onset  . Alcohol abuse Mother   . Drug abuse Mother   . Arthritis Maternal Grandmother   . Heart disease Maternal Grandmother   . Stroke Maternal Grandmother   . Hypertension Maternal Grandmother   . Kidney disease Maternal Grandmother   . Diabetes Maternal Grandmother   . Anxiety disorder Brother    Past Surgical History  Procedure Laterality Date  . Gastric bypass  [facility], Dr. Kennedy Bucker  . Tubal ligation    . Novasure ablation     Social History   Social History  . Marital Status: Married    Spouse Name: N/A  . Number of Children: N/A  . Years of Education: N/A   Social History Main Topics  . Smoking status: Never Smoker   . Smokeless tobacco: None  . Alcohol Use: Yes  . Drug Use: No  . Sexual Activity: Not Asked   Other Topics Concern  . None   Social History Narrative   Lives in [place].      Work - [employer]   Diet -  Regular    Exercise - limited by right knee pain    Review of Systems  Constitutional: Negative for fever, chills, appetite change, fatigue and unexpected weight change.  Eyes: Negative for visual disturbance.  Respiratory: Negative for shortness of breath.   Cardiovascular: Negative for chest pain and leg swelling.  Gastrointestinal: Negative for abdominal pain, diarrhea and constipation.  Musculoskeletal: Positive for myalgias, arthralgias and gait problem.  Skin: Negative for color change and rash.  Hematological: Negative for adenopathy. Does not bruise/bleed easily.  Psychiatric/Behavioral: Positive for sleep disturbance and dysphoric mood. Negative for suicidal ideas. The patient is nervous/anxious.        Objective:    BP 112/78 mmHg  Pulse 68  Temp(Src) 97.9 F (36.6 C) (Oral)  Wt 188 lb 6 oz (85.446 kg)  SpO2 96% Physical Exam  Constitutional: She is oriented to person, place, and time. She appears well-developed and well-nourished. No distress.  HENT:  Head: Normocephalic and atraumatic.  Right Ear: External ear normal.  Left Ear: External ear normal.  Nose: Nose normal.  Mouth/Throat: Oropharynx is clear and moist. No oropharyngeal exudate.  Eyes: Conjunctivae are normal. Pupils are equal, round, and reactive to light. Right eye exhibits no discharge. Left eye exhibits no  discharge. No scleral icterus.  Neck: Normal range of motion. Neck supple. No tracheal deviation present. No thyromegaly present.  Cardiovascular: Normal rate, regular rhythm, normal heart sounds and intact distal pulses.  Exam reveals no gallop and no friction rub.   No murmur heard. Pulmonary/Chest: Effort normal and breath sounds normal. No respiratory distress. She has no wheezes. She has no rales. She exhibits no tenderness.  Musculoskeletal: She exhibits no edema or tenderness.       Left knee: She exhibits decreased range of motion and swelling.  Lymphadenopathy:    She has no cervical  adenopathy.  Neurological: She is alert and oriented to person, place, and time. No cranial nerve deficit. She exhibits normal muscle tone. Coordination normal.  Skin: Skin is warm and dry. No rash noted. She is not diaphoretic. No erythema. No pallor.  Psychiatric: She has a normal mood and affect. Her behavior is normal. Judgment and thought content normal.          Assessment & Plan:   Problem List Items Addressed This Visit      Unprioritized   Anxiety state    Worsening anxiety with upcoming surgery. Will hold Wellbutrin. Discussed potential change in Fluoxetine to Cymbalta. Will hold until after surgery.      Relevant Medications   FLUoxetine (PROZAC) 40 MG capsule   Osteoarthritis of both knees    Bilateral OA. Scheduled for left knee replacement next month.Will follow.      Relevant Medications   HYDROcodone-acetaminophen (NORCO) 7.5-325 MG tablet   Preop examination - Primary    Pt would be consider low risk of perioperative cardiac events. Given that she has history of DVT/PE, she will have anticoagulation post-operatively. Will follow.      Screening for breast cancer    Mammogram scheduled.      Relevant Orders   MM Digital Screening       Return in about 8 weeks (around 08/03/2015) for Recheck.  Ronna PolioJennifer Astor Gentle, MD Internal Medicine [facility]

## 2015-06-08 NOTE — Assessment & Plan Note (Signed)
Mammogram scheduled.

## 2015-06-08 NOTE — Assessment & Plan Note (Signed)
Pt would be consider low risk of perioperative cardiac events. Given that she has history of DVT/PE, she will have anticoagulation post-operatively. Will follow.

## 2015-06-08 NOTE — Patient Instructions (Signed)
Stop Wellbutrin.  Follow up after surgery.

## 2015-06-18 ENCOUNTER — Encounter (HOSPITAL_COMMUNITY): Payer: Self-pay | Admitting: Physician Assistant

## 2015-06-18 DIAGNOSIS — M1712 Unilateral primary osteoarthritis, left knee: Secondary | ICD-10-CM | POA: Diagnosis present

## 2015-06-18 NOTE — Pre-Procedure Instructions (Signed)
    Anne James  06/18/2015    Your procedure is scheduled on Monday, April 17.  [facility] 5:30  A.M.                 Your surgery or procedure is scheduled for 7:30 AM   Call this number if you have problems the morning of surgery:[phone]                For any other questions, please call [phone], Monday - Friday 8 AM - 4 PM.   Remember:  Do not eat food or drink liquids after midnight Sunday, April 16.  Take these medicines the morning of surgery with A SIP OF WATER : buPROPion, FLUoxetine.   You make take Hydrocodone- acetaminophen for pain if needed and you tolerate it on an empty stomach.               Monday, Apri 10, Stop taking Celebrex.   Do not take any Aspirin Products, Aspirin, Advil , Ibuprofen, Aleve, Naproxen or Herbal products.   Do not wear jewelry, make-up or nail polish.  Do not wear lotions, powders, or perfumes.   Do not shave 48 hours prior to surgery.   Do not bring valuables to the hospital.  [facility] is not responsible for any belongings or valuables.  Contacts, dentures or bridgework may not be worn into surgery.  Leave your suitcase in the car.  After surgery it may be brought to your room.  For patients admitted to the hospital, discharge time will be determined by your treatment team.  Special instructions:  Review  [facility] - Preparing For Surgery.  Please read over the following fact sheets that you were given. Pain Booklet, Coughing and Deep Breathing, MRSA Information and Surgical Site Infection Prevention

## 2015-06-18 NOTE — H&P (Signed)
TOTAL KNEE ADMISSION H&P  Patient is being admitted for left total knee arthroplasty.  Subjective:  Chief Complaint:left knee pain.  HPI: Anne James, 45 y.o. female, has a history of pain and functional disability in the left knee due to arthritis and has failed non-surgical conservative treatments for greater than 12 weeks to includeNSAID's and/or analgesics, corticosteriod injections, viscosupplementation injections, flexibility and strengthening excercises, use of assistive devices, weight reduction as appropriate and activity modification.  Onset of symptoms was gradual, starting 10 years ago with gradually worsening course since that time. The patient noted no past surgery on the left knee(s).  Patient currently rates pain in the left knee(s) at 10 out of 10 with activity. Patient has night pain, worsening of pain with activity and weight bearing, pain that interferes with activities of daily living, crepitus and joint swelling.  Patient has evidence of subchondral sclerosis, periarticular osteophytes and joint space narrowing by imaging studies. There is no active infection.  Patient Active Problem List   Diagnosis Date Noted  . Primary localized osteoarthritis of left knee   . Preop examination 06/08/2015  . Anxiety state 06/08/2015  . Chronic fatigue 07/02/2014  . Routine general medical examination at a health care facility 10/31/2012  . Right hip pain 02/23/2012  . Osteoarthritis of both knees 10/17/2011  . Depression 10/17/2011  . Screening for breast cancer 10/17/2011  . Obesity (BMI 30-39.9) 10/17/2011   Past Medical History  Diagnosis Date  . Chicken pox   . Depression   . Eating disorder   . Phlebitis   . 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 Primary localized osteoarthritis of left knee     Past Surgical History  Procedure Laterality Date  . Gastric bypass  [facility], Dr. Kennedy Bucker  . Tubal  ligation    . Novasure ablation      No current facility-administered medications for this encounter.  Current outpatient prescriptions:  .  buPROPion (WELLBUTRIN XL) 150 MG 24 hr tablet, Take 150 mg by mouth daily., Disp: , Rfl:  .  celecoxib (CELEBREX) 200 MG capsule, Take 1 capsule by mouth  daily (Patient taking differently: Take 200 mg by mouth daily. ), Disp: 90 capsule, Rfl: 3 .  Cholecalciferol (VITAMIN D-3) 5000 UNITS TABS, Take 1 tablet by mouth daily., Disp: , Rfl:  .  cyanocobalamin 500 MCG tablet, Take 500 mcg by mouth daily., Disp: , Rfl:  .  FLUoxetine (PROZAC) 40 MG capsule, Take 1 capsule (40 mg total) by mouth daily., Disp: 30 capsule, Rfl: 3 .  HYDROcodone-acetaminophen (NORCO) 7.5-325 MG tablet, Take 1 tablet by mouth every 6 (six) hours as needed for moderate pain. , Disp: , Rfl:  .  Multiple Vitamin (MULTIVITAMIN) tablet, Take 1 tablet by mouth daily., Disp: , Rfl:     No Known Allergies    Social History  Substance Use Topics  . Smoking status: Never Smoker   . Smokeless tobacco: Not on file  . Alcohol Use: Yes    Family History  Problem Relation Age of Onset  . Alcohol abuse Mother   . Drug abuse Mother   . Arthritis Maternal Grandmother   . Heart disease Maternal Grandmother   . Stroke Maternal Grandmother   . Hypertension Maternal Grandmother   . Kidney disease Maternal Grandmother   . Diabetes Maternal Grandmother   . Anxiety disorder Brother  Review of Systems  Constitutional: Negative.   HENT: Negative.   Eyes: Negative.   Respiratory: Negative.   Cardiovascular: Negative.   Gastrointestinal: Negative.   Genitourinary: Negative.   Musculoskeletal: Positive for joint pain.  Skin: Negative.   Neurological: Negative.   Endo/Heme/Allergies: Negative.   Psychiatric/Behavioral: Negative.     Objective:  Physical Exam  Constitutional: She is oriented to person, place, and time. She appears well-developed and well-nourished.  HENT:   Head: Normocephalic and atraumatic.  Mouth/Throat: Oropharynx is clear and moist.  Eyes: Conjunctivae are normal. Pupils are equal, round, and reactive to light.  Neck: Neck supple.  Cardiovascular: Normal rate and regular rhythm.   Respiratory: Effort normal and breath sounds normal.  GI: Bowel sounds are normal.  Genitourinary:  Not pertinent to current symptomatology therefore not examined.  Musculoskeletal:  Examination of both knees reveal pain bilaterally, left worse than right.  1+ synovitis.  1+ crepitation.  Range of motion 0-120 degrees.  Knees are stable with normal patella tracking.  Vascular exam: Pulses are 2+ and symmetric.  Neurologic exam: Distal motor and sensory examination is within normal limits.   Neurological: She is alert and oriented to person, place, and time.  Skin: Skin is warm and dry.  Psychiatric: She has a normal mood and affect. Her behavior is normal.    Vital signs in last 24 hours:    Labs:   Estimated body mass index is 30.20 kg/(m^2) as calculated from the following:   Height as of 10/06/14: 5\' 5"  (1.651 m).   Weight as of 10/06/14: 82.328 kg (181 lb 8 oz).   Imaging Review Plain radiographs demonstrate severe degenerative joint disease of the left knee(s). The overall alignment issignificant varus. The bone quality appears to be good for age and reported activity level.  Assessment/Plan:  End stage arthritis, left knee Principal Problem:   Primary localized osteoarthritis of left knee Active Problems:   Osteoarthritis of both knees   Depression   Anxiety state    The patient history, physical examination, clinical judgment of the provider and imaging studies are consistent with end stage degenerative joint disease of the left knee(s) and total knee arthroplasty is deemed medically necessary. The treatment options including medical management, injection therapy arthroscopy and arthroplasty were discussed at length. The risks and benefits  of total knee arthroplasty were presented and reviewed. The risks due to aseptic loosening, infection, stiffness, patella tracking problems, thromboembolic complications and other imponderables were discussed. The patient acknowledged the explanation, agreed to proceed with the plan and consent was signed. Patient is being admitted for inpatient treatment for surgery, pain control, PT, OT, prophylactic antibiotics, VTE prophylaxis, progressive ambulation and ADL's and discharge planning. The patient is planning to be discharged home with home health services Lovenox post operatively for DVT prophylaxis due to history of PE and on SSRIs

## 2015-06-19 ENCOUNTER — Encounter (HOSPITAL_COMMUNITY): Payer: Self-pay

## 2015-06-19 ENCOUNTER — Encounter (HOSPITAL_COMMUNITY)
Admission: RE | Admit: 2015-06-19 | Discharge: 2015-06-19 | Disposition: A | Discharge status: Home / Self Care | Payer: Managed Care, Other (non HMO) | Source: Ambulatory Visit | Attending: Orthopedic Surgery | Admitting: Orthopedic Surgery

## 2015-06-19 DIAGNOSIS — M1712 Unilateral primary osteoarthritis, left knee: Secondary | ICD-10-CM | POA: Diagnosis not present

## 2015-06-19 DIAGNOSIS — Z01818 Encounter for other preprocedural examination: Secondary | ICD-10-CM | POA: Diagnosis not present

## 2015-06-19 DIAGNOSIS — Z0183 Encounter for blood typing: Secondary | ICD-10-CM | POA: Insufficient documentation

## 2015-06-19 DIAGNOSIS — Z01812 Encounter for preprocedural laboratory examination: Secondary | ICD-10-CM | POA: Insufficient documentation

## 2015-06-19 HISTORY — DX: Anxiety disorder, unspecified: F41.9

## 2015-06-19 LAB — COMPREHENSIVE METABOLIC PANEL
ALT: 19 U/L (ref 14–54)
AST: 23 U/L (ref 15–41)
Albumin: 3.5 g/dL (ref 3.5–5.0)
Alkaline Phosphatase: 50 U/L (ref 38–126)
Anion gap: 11 (ref 5–15)
BILIRUBIN TOTAL: 0.3 mg/dL (ref 0.3–1.2)
BUN: 14 mg/dL (ref 6–20)
CO2: 21 mmol/L — ABNORMAL LOW (ref 22–32)
CREATININE: 0.68 mg/dL (ref 0.44–1.00)
Calcium: 9 mg/dL (ref 8.9–10.3)
Chloride: 107 mmol/L (ref 101–111)
Glucose, Bld: 84 mg/dL (ref 65–99)
POTASSIUM: 3.7 mmol/L (ref 3.5–5.1)
Sodium: 139 mmol/L (ref 135–145)
TOTAL PROTEIN: 6.5 g/dL (ref 6.5–8.1)

## 2015-06-19 LAB — CBC WITH DIFFERENTIAL/PLATELET
BASOS ABS: 0 10*3/uL (ref 0.0–0.1)
Basophils Relative: 0 %
EOS PCT: 3 %
Eosinophils Absolute: 0.2 10*3/uL (ref 0.0–0.7)
HEMATOCRIT: 36.2 % (ref 36.0–46.0)
Hemoglobin: 11.6 g/dL — ABNORMAL LOW (ref 12.0–15.0)
LYMPHS ABS: 1.7 10*3/uL (ref 0.7–4.0)
LYMPHS PCT: 30 %
MCH: 26.5 pg (ref 26.0–34.0)
MCHC: 32 g/dL (ref 30.0–36.0)
MCV: 82.6 fL (ref 78.0–100.0)
MONO ABS: 0.7 10*3/uL (ref 0.1–1.0)
MONOS PCT: 11 %
NEUTROS ABS: 3.2 10*3/uL (ref 1.7–7.7)
Neutrophils Relative %: 56 %
Platelets: 377 10*3/uL (ref 150–400)
RBC: 4.38 MIL/uL (ref 3.87–5.11)
RDW: 16.1 % — AB (ref 11.5–15.5)
WBC: 5.8 10*3/uL (ref 4.0–10.5)

## 2015-06-19 LAB — ABO/RH: ABO/RH(D): A POS

## 2015-06-19 LAB — HCG, SERUM, QUALITATIVE: PREG SERUM: NEGATIVE

## 2015-06-19 LAB — PROTIME-INR
INR: 0.95 (ref 0.00–1.49)
PROTHROMBIN TIME: 12.9 s (ref 11.6–15.2)

## 2015-06-19 LAB — TYPE AND SCREEN
ABO/RH(D): A POS
Antibody Screen: NEGATIVE

## 2015-06-19 LAB — APTT: APTT: 26 s (ref 24–37)

## 2015-06-19 LAB — SURGICAL PCR SCREEN
MRSA, PCR: NEGATIVE
Staphylococcus aureus: NEGATIVE

## 2015-06-20 LAB — URINE CULTURE

## 2015-06-26 MED ORDER — CEFAZOLIN SODIUM-DEXTROSE 2-4 GM/100ML-% IV SOLN
2.0000 g | INTRAVENOUS | Status: AC
  Administered 2015-06-29: 2 g via INTRAVENOUS
  Filled 2015-06-26: qty 100

## 2015-06-26 MED ORDER — LACTATED RINGERS IV SOLN
INTRAVENOUS | Status: DC
  Administered 2015-06-29 (×2): via INTRAVENOUS

## 2015-06-29 ENCOUNTER — Inpatient Hospital Stay (HOSPITAL_COMMUNITY): Payer: Managed Care, Other (non HMO) | Admitting: Anesthesiology

## 2015-06-29 ENCOUNTER — Encounter (HOSPITAL_COMMUNITY): Payer: Self-pay | Admitting: *Deleted

## 2015-06-29 ENCOUNTER — Encounter (HOSPITAL_COMMUNITY)
Admission: RE | Disposition: A | Discharge status: Home / Self Care | Payer: Self-pay | Source: Ambulatory Visit | Attending: Orthopedic Surgery

## 2015-06-29 ENCOUNTER — Inpatient Hospital Stay (HOSPITAL_COMMUNITY)
Admission: RE | Admit: 2015-06-29 | Discharge: 2015-07-01 | DRG: 470 | Disposition: A | Discharge status: Home / Self Care | Payer: Managed Care, Other (non HMO) | Source: Ambulatory Visit | Attending: Orthopedic Surgery | Admitting: Orthopedic Surgery

## 2015-06-29 DIAGNOSIS — F329 Major depressive disorder, single episode, unspecified: Secondary | ICD-10-CM | POA: Diagnosis present

## 2015-06-29 DIAGNOSIS — Z96652 Presence of left artificial knee joint: Secondary | ICD-10-CM

## 2015-06-29 DIAGNOSIS — M170 Bilateral primary osteoarthritis of knee: Secondary | ICD-10-CM | POA: Diagnosis present

## 2015-06-29 DIAGNOSIS — F32A Depression, unspecified: Secondary | ICD-10-CM | POA: Diagnosis present

## 2015-06-29 DIAGNOSIS — F419 Anxiety disorder, unspecified: Secondary | ICD-10-CM | POA: Diagnosis present

## 2015-06-29 DIAGNOSIS — Z9884 Bariatric surgery status: Secondary | ICD-10-CM

## 2015-06-29 DIAGNOSIS — M25562 Pain in left knee: Secondary | ICD-10-CM | POA: Diagnosis present

## 2015-06-29 DIAGNOSIS — M1712 Unilateral primary osteoarthritis, left knee: Secondary | ICD-10-CM | POA: Diagnosis present

## 2015-06-29 DIAGNOSIS — R5383 Other fatigue: Secondary | ICD-10-CM | POA: Diagnosis present

## 2015-06-29 DIAGNOSIS — F411 Generalized anxiety disorder: Secondary | ICD-10-CM | POA: Diagnosis present

## 2015-06-29 HISTORY — DX: Unilateral primary osteoarthritis, left knee: M17.12

## 2015-06-29 HISTORY — PX: TOTAL KNEE ARTHROPLASTY: SHX125

## 2015-06-29 HISTORY — DX: Presence of left artificial knee joint: Z96.652

## 2015-06-29 LAB — CREATININE, SERUM
Creatinine, Ser: 0.64 mg/dL (ref 0.44–1.00)
GFR calc Af Amer: >60 mL/min (ref >60)
GFR calc non Af Amer: >60 mL/min (ref >60)

## 2015-06-29 LAB — CBC
HEMATOCRIT: 35.4 % — AB (ref 36.0–46.0)
Hemoglobin: 10.7 g/dL — ABNORMAL LOW (ref 12.0–15.0)
MCH: 25.1 pg — ABNORMAL LOW (ref 26.0–34.0)
MCHC: 30.2 g/dL (ref 30.0–36.0)
MCV: 82.9 fL (ref 78.0–100.0)
PLATELETS: 351 10*3/uL (ref 150–400)
RBC: 4.27 MIL/uL (ref 3.87–5.11)
RDW: 15.7 % — AB (ref 11.5–15.5)
WBC: 12.2 10*3/uL — ABNORMAL HIGH (ref 4.0–10.5)

## 2015-06-29 SURGERY — ARTHROPLASTY, KNEE, TOTAL
Anesthesia: General | Site: Knee | Laterality: Left

## 2015-06-29 MED ORDER — FLUOXETINE HCL 20 MG PO CAPS
40.0000 mg | ORAL_CAPSULE | Freq: Every day | ORAL | Status: DC
  Administered 2015-06-30 – 2015-07-01 (×2): 40 mg via ORAL
  Filled 2015-06-29 (×2): qty 2

## 2015-06-29 MED ORDER — DEXAMETHASONE SODIUM PHOSPHATE 10 MG/ML IJ SOLN
10.0000 mg | Freq: Three times a day (TID) | INTRAMUSCULAR | Status: AC
  Administered 2015-06-29 – 2015-06-30 (×4): 10 mg via INTRAVENOUS
  Filled 2015-06-29 (×4): qty 1

## 2015-06-29 MED ORDER — POTASSIUM CHLORIDE IN NACL 20-0.9 MEQ/L-% IV SOLN
INTRAVENOUS | Status: DC
  Administered 2015-06-29 – 2015-06-30 (×2): via INTRAVENOUS
  Filled 2015-06-29 (×2): qty 1000

## 2015-06-29 MED ORDER — CHLORHEXIDINE GLUCONATE 4 % EX LIQD: 60.0000 mL | Freq: Once | CUTANEOUS | Status: DC

## 2015-06-29 MED ORDER — BUPIVACAINE-EPINEPHRINE (PF) 0.5% -1:200000 IJ SOLN
INTRAMUSCULAR | Status: DC | PRN
  Administered 2015-06-29: 15 mL via PERINEURAL

## 2015-06-29 MED ORDER — ACETAMINOPHEN 160 MG/5ML PO SOLN
325.0000 mg | ORAL | Status: DC | PRN
  Filled 2015-06-29: qty 20.3

## 2015-06-29 MED ORDER — 0.9 % SODIUM CHLORIDE (POUR BTL) OPTIME
TOPICAL | Status: DC | PRN
  Administered 2015-06-29: 1000 mL

## 2015-06-29 MED ORDER — ENOXAPARIN SODIUM 30 MG/0.3ML ~~LOC~~ SOLN
30.0000 mg | Freq: Two times a day (BID) | SUBCUTANEOUS | Status: DC
  Administered 2015-06-30 – 2015-07-01 (×3): 30 mg via SUBCUTANEOUS
  Filled 2015-06-29 (×3): qty 0.3

## 2015-06-29 MED ORDER — SODIUM CHLORIDE 0.9 % IR SOLN
Status: DC | PRN
  Administered 2015-06-29 (×3): 1000 mL

## 2015-06-29 MED ORDER — ACETAMINOPHEN 325 MG PO TABS: 650.0000 mg | ORAL_TABLET | Freq: Four times a day (QID) | ORAL | Status: DC | PRN

## 2015-06-29 MED ORDER — DEXAMETHASONE SODIUM PHOSPHATE 10 MG/ML IJ SOLN
INTRAMUSCULAR | Status: DC | PRN
  Administered 2015-06-29: 10 mg via INTRAVENOUS

## 2015-06-29 MED ORDER — PROPOFOL 10 MG/ML IV BOLUS
INTRAVENOUS | Status: AC
  Filled 2015-06-29: qty 20

## 2015-06-29 MED ORDER — LIDOCAINE HCL (CARDIAC) 20 MG/ML IV SOLN
INTRAVENOUS | Status: AC
  Filled 2015-06-29: qty 5

## 2015-06-29 MED ORDER — PROPOFOL 10 MG/ML IV BOLUS
INTRAVENOUS | Status: AC
  Filled 2015-06-29: qty 40

## 2015-06-29 MED ORDER — HYDROMORPHONE HCL 1 MG/ML IJ SOLN: 0.2500 mg | INTRAMUSCULAR | Status: DC | PRN

## 2015-06-29 MED ORDER — ONDANSETRON HCL 4 MG/2ML IJ SOLN
INTRAMUSCULAR | Status: AC
  Filled 2015-06-29: qty 2

## 2015-06-29 MED ORDER — BUPROPION HCL ER (XL) 150 MG PO TB24
150.0000 mg | ORAL_TABLET | Freq: Every day | ORAL | Status: DC
  Administered 2015-06-30 – 2015-07-01 (×2): 150 mg via ORAL
  Filled 2015-06-29 (×2): qty 1

## 2015-06-29 MED ORDER — ONDANSETRON HCL 4 MG PO TABS: 4.0000 mg | ORAL_TABLET | Freq: Four times a day (QID) | ORAL | Status: DC | PRN

## 2015-06-29 MED ORDER — ALUM & MAG HYDROXIDE-SIMETH 200-200-20 MG/5ML PO SUSP: 30.0000 mL | ORAL | Status: DC | PRN

## 2015-06-29 MED ORDER — CELECOXIB 200 MG PO CAPS
200.0000 mg | ORAL_CAPSULE | Freq: Two times a day (BID) | ORAL | Status: DC
  Administered 2015-06-29 – 2015-07-01 (×5): 200 mg via ORAL
  Filled 2015-06-29 (×5): qty 1

## 2015-06-29 MED ORDER — BUPIVACAINE-EPINEPHRINE 0.25% -1:200000 IJ SOLN
INTRAMUSCULAR | Status: DC | PRN
  Administered 2015-06-29: 30 mL

## 2015-06-29 MED ORDER — EPHEDRINE SULFATE 50 MG/ML IJ SOLN
INTRAMUSCULAR | Status: AC
  Filled 2015-06-29: qty 1

## 2015-06-29 MED ORDER — OXYCODONE HCL 5 MG PO TABS
5.0000 mg | ORAL_TABLET | ORAL | Status: DC | PRN
  Administered 2015-06-29 – 2015-07-01 (×10): 10 mg via ORAL
  Filled 2015-06-29 (×9): qty 2
  Filled 2015-06-29: qty 3

## 2015-06-29 MED ORDER — DIPHENHYDRAMINE HCL 12.5 MG/5ML PO ELIX: 12.5000 mg | ORAL_SOLUTION | ORAL | Status: DC | PRN

## 2015-06-29 MED ORDER — ONDANSETRON HCL 4 MG/2ML IJ SOLN: 4.0000 mg | Freq: Four times a day (QID) | INTRAMUSCULAR | Status: DC | PRN

## 2015-06-29 MED ORDER — ACETAMINOPHEN 325 MG PO TABS: 325.0000 mg | ORAL_TABLET | ORAL | Status: DC | PRN

## 2015-06-29 MED ORDER — MIDAZOLAM HCL 2 MG/2ML IJ SOLN
INTRAMUSCULAR | Status: AC
  Filled 2015-06-29: qty 2

## 2015-06-29 MED ORDER — PROPOFOL 10 MG/ML IV BOLUS
INTRAVENOUS | Status: DC | PRN
  Administered 2015-06-29: 10 mg via INTRAVENOUS
  Administered 2015-06-29 (×2): 20 mg via INTRAVENOUS

## 2015-06-29 MED ORDER — METOCLOPRAMIDE HCL 5 MG/ML IJ SOLN: 5.0000 mg | Freq: Three times a day (TID) | INTRAMUSCULAR | Status: DC | PRN

## 2015-06-29 MED ORDER — DOCUSATE SODIUM 100 MG PO CAPS
100.0000 mg | ORAL_CAPSULE | Freq: Two times a day (BID) | ORAL | Status: DC
  Administered 2015-06-29 – 2015-07-01 (×5): 100 mg via ORAL
  Filled 2015-06-29 (×6): qty 1

## 2015-06-29 MED ORDER — FENTANYL CITRATE (PF) 100 MCG/2ML IJ SOLN
INTRAMUSCULAR | Status: DC | PRN
  Administered 2015-06-29: 50 ug via INTRAVENOUS

## 2015-06-29 MED ORDER — OXYCODONE HCL 5 MG/5ML PO SOLN: 5.0000 mg | Freq: Once | ORAL | Status: DC | PRN

## 2015-06-29 MED ORDER — MIDAZOLAM HCL 5 MG/5ML IJ SOLN
INTRAMUSCULAR | Status: DC | PRN
  Administered 2015-06-29: 2 mg via INTRAVENOUS

## 2015-06-29 MED ORDER — SODIUM CHLORIDE 0.9 % IJ SOLN
INTRAMUSCULAR | Status: AC
Start: 2015-06-29 — End: 2015-06-29
  Filled 2015-06-29: qty 10

## 2015-06-29 MED ORDER — PROPRANOLOL HCL 1 MG/ML IV SOLN
INTRAVENOUS | Status: AC
  Filled 2015-06-29: qty 1

## 2015-06-29 MED ORDER — POLYETHYLENE GLYCOL 3350 17 G PO PACK
17.0000 g | PACK | Freq: Two times a day (BID) | ORAL | Status: DC
  Administered 2015-06-29 – 2015-07-01 (×5): 17 g via ORAL
  Filled 2015-06-29 (×5): qty 1

## 2015-06-29 MED ORDER — BUPIVACAINE-EPINEPHRINE (PF) 0.25% -1:200000 IJ SOLN
INTRAMUSCULAR | Status: AC
  Filled 2015-06-29: qty 30

## 2015-06-29 MED ORDER — PHENOL 1.4 % MT LIQD: 1.0000 | OROMUCOSAL | Status: DC | PRN

## 2015-06-29 MED ORDER — VITAMIN D 1000 UNITS PO TABS
5000.0000 [IU] | ORAL_TABLET | Freq: Every day | ORAL | Status: DC
  Administered 2015-06-30 – 2015-07-01 (×2): 5000 [IU] via ORAL
  Filled 2015-06-29 (×2): qty 5

## 2015-06-29 MED ORDER — CEFAZOLIN SODIUM-DEXTROSE 2-4 GM/100ML-% IV SOLN
2.0000 g | Freq: Four times a day (QID) | INTRAVENOUS | Status: AC
  Administered 2015-06-29 (×2): 2 g via INTRAVENOUS
  Filled 2015-06-29 (×3): qty 100

## 2015-06-29 MED ORDER — HYDROMORPHONE HCL 1 MG/ML IJ SOLN
1.0000 mg | INTRAMUSCULAR | Status: DC | PRN
  Administered 2015-06-29 – 2015-06-30 (×8): 1 mg via INTRAVENOUS
  Filled 2015-06-29 (×9): qty 1

## 2015-06-29 MED ORDER — LIDOCAINE HCL (CARDIAC) 20 MG/ML IV SOLN
INTRAVENOUS | Status: DC | PRN
  Administered 2015-06-29: 60 mg via INTRAVENOUS

## 2015-06-29 MED ORDER — OXYCODONE HCL 5 MG PO TABS: 5.0000 mg | ORAL_TABLET | Freq: Once | ORAL | Status: DC | PRN

## 2015-06-29 MED ORDER — ACETAMINOPHEN 650 MG RE SUPP: 650.0000 mg | Freq: Four times a day (QID) | RECTAL | Status: DC | PRN

## 2015-06-29 MED ORDER — ROCURONIUM BROMIDE 50 MG/5ML IV SOLN
INTRAVENOUS | Status: AC
  Filled 2015-06-29: qty 1

## 2015-06-29 MED ORDER — FENTANYL CITRATE (PF) 250 MCG/5ML IJ SOLN
INTRAMUSCULAR | Status: AC
  Filled 2015-06-29: qty 5

## 2015-06-29 MED ORDER — METOCLOPRAMIDE HCL 5 MG PO TABS: 5.0000 mg | ORAL_TABLET | Freq: Three times a day (TID) | ORAL | Status: DC | PRN

## 2015-06-29 MED ORDER — PROPOFOL 500 MG/50ML IV EMUL
INTRAVENOUS | Status: DC | PRN
  Administered 2015-06-29: 50 ug/kg/min via INTRAVENOUS

## 2015-06-29 MED ORDER — POVIDONE-IODINE 7.5 % EX SOLN
Freq: Once | CUTANEOUS | Status: DC
  Filled 2015-06-29: qty 118

## 2015-06-29 MED ORDER — ONDANSETRON HCL 4 MG/2ML IJ SOLN
INTRAMUSCULAR | Status: DC | PRN
  Administered 2015-06-29: 4 mg via INTRAVENOUS

## 2015-06-29 MED ORDER — MENTHOL 3 MG MT LOZG: 1.0000 | LOZENGE | OROMUCOSAL | Status: DC | PRN

## 2015-06-29 MED ORDER — SUCCINYLCHOLINE CHLORIDE 20 MG/ML IJ SOLN
INTRAMUSCULAR | Status: AC
  Filled 2015-06-29: qty 1

## 2015-06-29 MED ORDER — BUPIVACAINE IN DEXTROSE 0.75-8.25 % IT SOLN
INTRATHECAL | Status: DC | PRN
  Administered 2015-06-29: 1.8 mL via INTRATHECAL

## 2015-06-29 SURGICAL SUPPLY — 69 items
BANDAGE ESMARK 6X9 LF (GAUZE/BANDAGES/DRESSINGS) ×1 IMPLANT
BENZOIN TINCTURE PRP APPL 2/3 (GAUZE/BANDAGES/DRESSINGS) ×3 IMPLANT
BLADE SAGITTAL 25.0X1.19X90 (BLADE) ×2 IMPLANT
BLADE SAGITTAL 25.0X1.19X90MM (BLADE) ×1
BLADE SAW RECIP 87.9 MT (BLADE) ×3 IMPLANT
BLADE SAW SGTL 13X75X1.27 (BLADE) ×3 IMPLANT
BLADE SURG 10 STRL SS (BLADE) ×6 IMPLANT
BNDG ELASTIC 6X15 VLCR STRL LF (GAUZE/BANDAGES/DRESSINGS) ×3 IMPLANT
BNDG ESMARK 6X9 LF (GAUZE/BANDAGES/DRESSINGS) ×3
BOWL SMART MIX CTS (DISPOSABLE) ×3 IMPLANT
CAPT KNEE TOTAL 3 ATTUNE ×3 IMPLANT
CEMENT HV SMART SET (Cement) ×6 IMPLANT
CLOSURE STERI-STRIP 1/2X4 (GAUZE/BANDAGES/DRESSINGS) ×1
CLOSURE WOUND 1/2 X4 (GAUZE/BANDAGES/DRESSINGS) ×1
CLSR STERI-STRIP ANTIMIC 1/2X4 (GAUZE/BANDAGES/DRESSINGS) ×2 IMPLANT
COVER SURGICAL LIGHT HANDLE (MISCELLANEOUS) ×3 IMPLANT
CUFF TOURNIQUET SINGLE 34IN LL (TOURNIQUET CUFF) ×3 IMPLANT
CUFF TOURNIQUET SINGLE 44IN (TOURNIQUET CUFF) IMPLANT
DECANTER SPIKE VIAL GLASS SM (MISCELLANEOUS) ×3 IMPLANT
DRAPE EXTREMITY T 121X128X90 (DRAPE) ×3 IMPLANT
DRAPE INCISE IOBAN 66X45 STRL (DRAPES) ×3 IMPLANT
DRAPE PROXIMA HALF (DRAPES) ×3 IMPLANT
DRAPE U-SHAPE 47X51 STRL (DRAPES) ×3 IMPLANT
DRSG AQUACEL AG ADV 3.5X14 (GAUZE/BANDAGES/DRESSINGS) ×3 IMPLANT
DURAPREP 26ML APPLICATOR (WOUND CARE) ×6 IMPLANT
ELECT CAUTERY BLADE 6.4 (BLADE) ×3 IMPLANT
ELECT REM PT RETURN 9FT ADLT (ELECTROSURGICAL) ×3
ELECTRODE REM PT RTRN 9FT ADLT (ELECTROSURGICAL) ×1 IMPLANT
FACESHIELD WRAPAROUND (MASK) ×3 IMPLANT
GLOVE BIO SURGEON STRL SZ7 (GLOVE) ×3 IMPLANT
GLOVE BIOGEL PI IND STRL 7.0 (GLOVE) ×1 IMPLANT
GLOVE BIOGEL PI IND STRL 7.5 (GLOVE) ×1 IMPLANT
GLOVE BIOGEL PI INDICATOR 7.0 (GLOVE) ×2
GLOVE BIOGEL PI INDICATOR 7.5 (GLOVE) ×2
GLOVE SS BIOGEL STRL SZ 7.5 (GLOVE) ×1 IMPLANT
GLOVE SUPERSENSE BIOGEL SZ 7.5 (GLOVE) ×2
GOWN STRL REUS W/ TWL LRG LVL3 (GOWN DISPOSABLE) ×1 IMPLANT
GOWN STRL REUS W/ TWL XL LVL3 (GOWN DISPOSABLE) ×3 IMPLANT
GOWN STRL REUS W/TWL LRG LVL3 (GOWN DISPOSABLE) ×2
GOWN STRL REUS W/TWL XL LVL3 (GOWN DISPOSABLE) ×6
HANDPIECE INTERPULSE COAX TIP (DISPOSABLE) ×2
HOOD PEEL AWAY FACE SHEILD DIS (HOOD) ×9 IMPLANT
IMMOBILIZER KNEE 22 UNIV (SOFTGOODS) ×3 IMPLANT
KIT BASIN OR (CUSTOM PROCEDURE TRAY) ×3 IMPLANT
KIT ROOM TURNOVER OR (KITS) ×3 IMPLANT
MANIFOLD NEPTUNE II (INSTRUMENTS) ×3 IMPLANT
MARKER SKIN DUAL TIP RULER LAB (MISCELLANEOUS) ×3 IMPLANT
NEEDLE 18GX1X1/2 (RX/OR ONLY) (NEEDLE) ×3 IMPLANT
NS IRRIG 1000ML POUR BTL (IV SOLUTION) ×3 IMPLANT
PACK TOTAL JOINT (CUSTOM PROCEDURE TRAY) ×3 IMPLANT
PAD ARMBOARD 7.5X6 YLW CONV (MISCELLANEOUS) ×6 IMPLANT
SET HNDPC FAN SPRY TIP SCT (DISPOSABLE) ×1 IMPLANT
STRIP CLOSURE SKIN 1/2X4 (GAUZE/BANDAGES/DRESSINGS) ×2 IMPLANT
SUCTION FRAZIER HANDLE 10FR (MISCELLANEOUS) ×2
SUCTION TUBE FRAZIER 10FR DISP (MISCELLANEOUS) ×1 IMPLANT
SUT MNCRL AB 3-0 PS2 18 (SUTURE) ×3 IMPLANT
SUT VIC AB 0 CT1 27 (SUTURE) ×4
SUT VIC AB 0 CT1 27XBRD ANBCTR (SUTURE) ×2 IMPLANT
SUT VIC AB 1 CT1 27 (SUTURE) ×2
SUT VIC AB 1 CT1 27XBRD ANBCTR (SUTURE) ×1 IMPLANT
SUT VIC AB 2-0 CT1 27 (SUTURE) ×4
SUT VIC AB 2-0 CT1 TAPERPNT 27 (SUTURE) ×2 IMPLANT
SYR 30ML LL (SYRINGE) ×3 IMPLANT
TOWEL OR 17X24 6PK STRL BLUE (TOWEL DISPOSABLE) ×3 IMPLANT
TOWEL OR 17X26 10 PK STRL BLUE (TOWEL DISPOSABLE) ×3 IMPLANT
TRAY FOLEY CATH 16FR SILVER (SET/KITS/TRAYS/PACK) ×3 IMPLANT
TUBE CONNECTING 12'X1/4 (SUCTIONS) ×1
TUBE CONNECTING 12X1/4 (SUCTIONS) ×2 IMPLANT
YANKAUER SUCT BULB TIP NO VENT (SUCTIONS) ×3 IMPLANT

## 2015-06-29 NOTE — Anesthesia Procedure Notes (Signed)
Anesthesia Regional Block:  Adductor canal block  Pre-Anesthetic Checklist: ,, timeout performed, Correct Patient, Correct Site, Correct Laterality, Correct Procedure, Correct Position, site marked, Risks and benefits discussed,  Surgical consent,  Pre-op evaluation,  At surgeon's request and post-op pain management  Laterality: Lower and Left  Prep: chloraprep       Needles:  Injection technique: Single-shot  Needle Type: Echogenic Stimulator Needle          Additional Needles:  Procedures: ultrasound guided (picture in chart) Adductor canal block Narrative:  Injection made incrementally with aspirations every 5 mL.  Performed by: Personally  Anesthesiologist: Fumi Guadron  Additional Notes: H+P and labs reviewed, risks and benefits discussed with patient, procedure tolerated well without complications   Spinal Patient location during procedure: OR Staffing Anesthesiologist: Shondell Poulson Preanesthetic Checklist Completed: patient identified, surgical consent, pre-op evaluation, timeout performed, IV checked, risks and benefits discussed and monitors and equipment checked Spinal Block Patient position: sitting Prep: site prepped and draped and DuraPrep Patient monitoring: heart rate, cardiac monitor, continuous pulse ox and blood pressure Approach: midline Location: L3-4 Injection technique: single-shot Needle Needle type: Pencan  Needle gauge: 24 G Needle length: 10 cm Assessment Sensory level: T8

## 2015-06-29 NOTE — Progress Notes (Signed)
Orthopedic Tech Progress Note Patient Details:  Madelyn FlavorsLori D Portugal [birth year] [id number]  Ortho Devices Type of Ortho Device: Knee Immobilizer   Saul FordyceJennifer C Alfreida Steffenhagen 06/29/2015, 12:49 PM

## 2015-06-29 NOTE — Op Note (Signed)
MRN:     [id number] DOB/AGE:    04/07/1970 / 45 y.o.       OPERATIVE REPORT    DATE OF PROCEDURE:  06/29/2015       PREOPERATIVE DIAGNOSIS:   PRIMARY LOCALIZED OA LEFT KNEE      Estimated body mass index is 31.28 kg/(m^2) as calculated from the following:   Height as of 06/19/15: 5' 4.5" (1.638 m).   Weight as of this encounter: 83.915 kg (185 lb).                                                        POSTOPERATIVE DIAGNOSIS:   SAME                                                                      PROCEDURE:  Procedure(s): LEFT TOTAL KNEE ARTHROPLASTY Using Depuy Attune RP implants #5 Femur, #4Tibia, 5mm  RP bearing, 32 Patella     SURGEON: Windell Musson A    ASSISTANT:  Kirstin Shepperson PA-C   (Present and scrubbed throughout the case, critical for assistance with exposure, retraction, instrumentation, and closure.)         ANESTHESIA: Spinal with Adductor Nerve Block     TOURNIQUET TIME:   COMPLICATIONS:  None     SPECIMENS: None   INDICATIONS FOR PROCEDURE: The patient has  DJD LEFT KNEE, varus deformities, XR shows bone on bone arthritis. Patient has failed all conservative measures including anti-inflammatory medicines, narcotics, attempts at  exercise and weight loss, cortisone injections and viscosupplementation.  Risks and benefits of surgery have been discussed, questions answered.   DESCRIPTION OF PROCEDURE: The patient identified by armband, received  right femoral nerve block and IV antibiotics, in the holding area at [facility]. Patient taken to the operating room, appropriate anesthetic  monitors were attached General endotracheal anesthesia induced with  the patient in supine position, Foley catheter was inserted. Tourniquet  applied high to the operative thigh. Lateral post and foot positioner  applied to the table, the lower extremity was then prepped and draped  in usual sterile fashion from the ankle to the tourniquet. Time-out procedure was  performed. The limb was wrapped with an Esmarch bandage and the tourniquet inflated to 365 mmHg. We began the operation by making the anterior midline incision starting at handbreadth above the patella going over the patella 1 cm medial to and  4 cm distal to the tibial tubercle. Small bleeders in the skin and the  subcutaneous tissue identified and cauterized. Transverse retinaculum was incised and reflected medially and a medial parapatellar arthrotomy was accomplished. the patella was everted and theprepatellar fat pad resected. The superficial medial collateral  ligament was then elevated from anterior to posterior along the proximal  flare of the tibia and anterior half of the menisci resected. The knee was hyperflexed exposing bone on bone arthritis. Peripheral and notch osteophytes as well as the cruciate ligaments were then resected. We continued to  work our way around posteriorly along the proximal tibia, and externally  rotated the tibia subluxing it  out from underneath the femur. A McHale  retractor was placed through the notch and a lateral Hohmann retractor  placed, and we then drilled through the proximal tibia in line with the  axis of the tibia followed by an intramedullary guide rod and 2-degree  posterior slope cutting guide. The tibial cutting guide was pinned into place  allowing resection of 6 mm of bone medially and about 4 mm of bone  laterally because of her valgus deformity. Satisfied with the tibial resection, we then  entered the distal femur 2 mm anterior to the PCL origin with the  intramedullary guide rod and applied the distal femoral cutting guide  set at 11mm, with 5 degrees of valgus. This was pinned along the  epicondylar axis. At this point, the distal femoral cut was accomplished without difficulty. We then sized for a #5 femoral component and pinned the guide in 3 degrees of external rotation.The chamfer cutting guide was pinned into place. The anterior,  posterior, and chamfer cuts were accomplished without difficulty followed by  the  RP box cutting guide and the box cut. We also removed posterior osteophytes from the posterior femoral condyles. At this  time, the knee was brought into full extension. We checked our  extension and flexion gaps and found them symmetric at 5mm.  The patella thickness measured at 20 mm. We set the cutting guide at 12 and removed the posterior 8 mm  of the patella sized for 32 button and drilled the lollipop. The knee  was then once again hyperflexed exposing the proximal tibia. We sized for a #4 tibial base plate, applied the smokestack and the conical reamer followed by the the Delta fin keel punch. We then hammered into place the  RP trial femoral component, inserted a 1 trial bearing, trial patellar button, and took the knee through range of motion from 0-130 degrees. No thumb pressure was required for patellar  tracking. At this point, all trial components were removed, a double batch of DePuy HV cement  was mixed and applied to all bony metallic mating surfaces except for the posterior condyles of the femur itself. In order, we  hammered into place the tibial tray and removed excess cement, the femoral component and removed excess cement, a 5mm  RP bearing  was inserted, and the knee brought to full extension with compression.  The patellar button was clamped into place, and excess cement  removed. While the cement cured the wound was irrigated out with normal saline solution pulse lavage.. Ligament stability and patellar tracking were checked and found to be excellent.. The parapatellar arthrotomy was closed with  #1 Vicryl suture. The subcutaneous tissue with 0 and 2-0 undyed  Vicryl suture, and 4-0 Monocryl.. A dressing of Aquaseal,  4 x 4, dressing sponges, Webril, and Ace wrap applied. Needle and sponge count were correct times 2.The patient awakened, extubated, and taken to recovery room without difficulty.  Vascular status was normal, pulses 2+ and symmetric.   Vickie Melnik A 06/29/2015, 9:00 AM

## 2015-06-29 NOTE — Progress Notes (Signed)
Orthopedic Tech Progress Note Patient Details:  Anne James Jul 23, 1970 [id number]  CPM Left Knee CPM Left Knee: On Left Knee Flexion (Degrees): 90 Left Knee Extension (Degrees): 0 Additional Comments: Trapeze bar   Saul FordyceJennifer C Xavier Munger 06/29/2015, 10:39 AM

## 2015-06-29 NOTE — Transfer of Care (Signed)
Immediate Anesthesia Transfer of Care Note  Patient: Anne James  Procedure(s) Performed: Procedure(s): LEFT TOTAL KNEE ARTHROPLASTY (Left)  Patient Location: PACU  Anesthesia Type:MAC, Regional and Spinal  Level of Consciousness: awake, alert  and oriented  Airway & Oxygen Therapy: Patient Spontanous Breathing  Post-op Assessment: Report given to RN and Post -op Vital signs reviewed and stable  Post vital signs: Reviewed and stable  Last Vitals:  Filed Vitals:   06/29/15 0552  BP: 139/66  Pulse: 72  Temp: 36.6 C  Resp: 20    HR 78, RR 14, bP 122/61, sats 98% on RA  Complications: No apparent anesthesia complications

## 2015-06-29 NOTE — Progress Notes (Addendum)
Contacted [facility], third party agency used by patient's insurance to set up home health care, requested HHPT and faxed required information. Requested that process be expedited.Will continue to follow.      06/30/15 Received call from [facility], they have [facility] [phone], service to start 07/01/15. Contacted Kirstin BoeingShepperson PA, informed her that patient is set up with HHPT. Plan has changed to patient going directly to outpatient PT, [facility] at 1:30pm. She asked that home PT be cancelled. [facility], spoke with Clydie BraunKaren, cancelled home PT. Contacted [facility], spoke with Boneta LucksJenny and verified that they have patient scheduled for Friday at 1:30.

## 2015-06-29 NOTE — Progress Notes (Signed)
Orthopedic Tech Progress Note Patient Details:  Anne James 07/09/1970 [id number]  CPM Left Knee CPM Left Knee: On Left Knee Flexion (Degrees): 90 Left Knee Extension (Degrees): 0 Additional Comments: 1453   Anne James 06/29/2015, 3:32 PM

## 2015-06-29 NOTE — Anesthesia Preprocedure Evaluation (Addendum)
Anesthesia Evaluation  Patient identified by MRN, date of birth, ID band Patient awake    Reviewed: Allergy & Precautions, NPO status , Patient's Chart, lab work & pertinent test results  History of Anesthesia Complications Negative for: history of anesthetic complications  Airway Mallampati: I  TM Distance: >3 FB Neck ROM: Full    Dental  (+) Dental Advisory Given, Teeth Intact   Pulmonary neg pulmonary ROS,    breath sounds clear to auscultation       Cardiovascular negative cardio ROS   Rhythm:Regular     Neuro/Psych Anxiety Depression negative neurological ROS     GI/Hepatic negative GI ROS, Neg liver ROS,   Endo/Other  negative endocrine ROS  Renal/GU negative Renal ROS     Musculoskeletal  (+) Arthritis ,   Abdominal   Peds  Hematology negative hematology ROS (+)   Anesthesia Other Findings   Reproductive/Obstetrics                            Anesthesia Physical Anesthesia Plan  ASA: II  Anesthesia Plan: Spinal   Post-op Pain Management:  Regional for Post-op pain   Induction:   Airway Management Planned: Nasal Cannula, Simple Face Mask and Natural Airway  Additional Equipment: None  Intra-op Plan:   Post-operative Plan:   Informed Consent: I have reviewed the patients History and Physical, chart, labs and discussed the procedure including the risks, benefits and alternatives for the proposed anesthesia with the patient or authorized representative who has indicated his/her understanding and acceptance.   Dental advisory given  Plan Discussed with: CRNA and Surgeon  Anesthesia Plan Comments:        Anesthesia Quick Evaluation

## 2015-06-29 NOTE — Interval H&P Note (Signed)
History and Physical Interval Note:  06/29/2015 6:07 AM  Anne James  has presented today for surgery, with the diagnosis of PRIMARY LOCALIZED OA LEFT KNEE  The various methods of treatment have been discussed with the patient and family. After consideration of risks, benefits and other options for treatment, the patient has consented to  Procedure(s): LEFT TOTAL KNEE ARTHROPLASTY (Left) as a surgical intervention .  The patient's history has been reviewed, patient examined, no change in status, stable for surgery.  I have reviewed the patient's chart and labs.  Questions were answered to the patient's satisfaction.     Salvatore MarvelWAINER,Emmylou Bieker A

## 2015-06-29 NOTE — Progress Notes (Signed)
Utilization review completed.  

## 2015-06-29 NOTE — Evaluation (Signed)
Physical Therapy Evaluation Patient Details Name: Anne James MRN: [id number] DOB: 08-Apr-1970 Today's Date: 06/29/2015   History of Present Illness  45 y.o. female now s/p Lt TKA. PMH: depression, anxiety, gastric bypass.   Clinical Impression  Pt is s/p TKA resulting in the deficits listed below (see PT Problem List).  Pt will benefit from skilled PT to increase their independence and safety with mobility to allow discharge to home with family assistance. Pt mobilizing well during initial session, pt reports that she is anticipating that she will go directly back home. Husband and mother present for session and appear supportive.      Follow Up Recommendations Outpatient PT;Supervision for mobility/OOB    Equipment Recommendations  None recommended by PT (pt reports having rw already arranged. )    Recommendations for Other Services       Precautions / Restrictions Precautions Precautions: Knee;Fall Precaution Booklet Issued: Yes (comment) Precaution Comments: HEP provided, reviewed knee extension and no pillow under knee.  Required Braces or Orthoses: Knee Immobilizer - Left Knee Immobilizer - Left: Discontinue once straight leg raise with < 10 degree lag Restrictions Weight Bearing Restrictions: Yes LLE Weight Bearing: Weight bearing as tolerated      Mobility  Bed Mobility Overal bed mobility: Needs Assistance Bed Mobility: Supine to Sit     Supine to sit: Min assist     General bed mobility comments: min assist with LLE  Transfers Overall transfer level: Needs assistance Equipment used: Rolling walker (2 wheeled) Transfers: Sit to/from Stand Sit to Stand: Min assist         General transfer comment: cues for hand position  Ambulation/Gait Ambulation/Gait assistance: Min guard Ambulation Distance (Feet): 8 Feet Assistive device: Rolling walker (2 wheeled) Gait Pattern/deviations: Step-to pattern;Decreased weight shift to left;Decreased step length -  right Gait velocity: decreased   General Gait Details: cues for gait sequence provided, using Knee immobilizer.   Stairs            Wheelchair Mobility    Modified Rankin (Stroke Patients Only)       Balance Overall balance assessment: Needs assistance Sitting-balance support: No upper extremity supported Sitting balance-Leahy Scale: Good     Standing balance support: Bilateral upper extremity supported Standing balance-Leahy Scale: Poor Standing balance comment: using rw                             Pertinent Vitals/Pain Pain Assessment: 0-10 Pain Score: 7  Pain Location: Lt knee  Pain Descriptors / Indicators:  (just pain) Pain Intervention(s): Limited activity within patient's tolerance;Monitored during session;RN gave pain meds during session    Home Living Family/patient expects to be discharged to:: Private residence Living Arrangements: Spouse/significant other;Children;Parent Available Help at Discharge: Family;Available 24 hours/day Type of Home: House Home Access: Ramped entrance     Home Layout: One level Home Equipment: Walker - 2 wheels      Prior Function Level of Independence: Independent               Hand Dominance        Extremity/Trunk Assessment               Lower Extremity Assessment: LLE deficits/detail   LLE Deficits / Details: min assist needed for SLR     Communication   Communication: No difficulties  Cognition Arousal/Alertness: Awake/alert Behavior During Therapy: WFL for tasks assessed/performed Overall Cognitive Status: Within Functional Limits for tasks assessed  General Comments      Exercises        Assessment/Plan    PT Assessment Patient needs continued PT services  PT Diagnosis Difficulty walking;Acute pain   PT Problem List Decreased strength;Decreased range of motion;Decreased activity tolerance;Decreased balance;Decreased mobility;Decreased  knowledge of use of DME;Pain  PT Treatment Interventions DME instruction;Gait training;Stair training;Functional mobility training;Therapeutic activities;Therapeutic exercise;Balance training;Neuromuscular re-education;Patient/family education   PT Goals (Current goals can be found in the Care Plan section) Acute Rehab PT Goals Patient Stated Goal: go home PT Goal Formulation: With patient Time For Goal Achievement: 07/13/15 Potential to Achieve Goals: Good    Frequency 7X/week   Barriers to discharge        Co-evaluation               End of Session Equipment Utilized During Treatment: Gait belt;Left knee immobilizer Activity Tolerance: Patient tolerated treatment well;Patient limited by pain Patient left: in chair;with family/visitor present;with call bell/phone within reach;Other (comment) (in knee extension) Nurse Communication: Mobility status;Weight bearing status         Time: [id number] PT Time Calculation (min) (ACUTE ONLY): 28 min   Charges:   PT Evaluation $PT Eval Moderate Complexity: 1 Procedure PT Treatments $Therapeutic Activity: 8-22 mins   PT G Codes:        Christiane HaBenjamin J. Glen Kesinger, PT, CSCS Pager [phone] Office [phone]  06/29/2015, 3:30 PM

## 2015-06-30 ENCOUNTER — Encounter (HOSPITAL_COMMUNITY): Payer: Self-pay | Admitting: Orthopedic Surgery

## 2015-06-30 LAB — CBC
HEMATOCRIT: 34.9 % — AB (ref 36.0–46.0)
HEMOGLOBIN: 10.5 g/dL — AB (ref 12.0–15.0)
MCH: 25 pg — ABNORMAL LOW (ref 26.0–34.0)
MCHC: 30.1 g/dL (ref 30.0–36.0)
MCV: 83.1 fL (ref 78.0–100.0)
Platelets: 374 10*3/uL (ref 150–400)
RBC: 4.2 MIL/uL (ref 3.87–5.11)
RDW: 15.5 % (ref 11.5–15.5)
WBC: 11.8 10*3/uL — AB (ref 4.0–10.5)

## 2015-06-30 LAB — BASIC METABOLIC PANEL
ANION GAP: 11 (ref 5–15)
BUN: <5 mg/dL — ABNORMAL LOW (ref 6–20)
CHLORIDE: 104 mmol/L (ref 101–111)
CO2: 24 mmol/L (ref 22–32)
Calcium: 9 mg/dL (ref 8.9–10.3)
Creatinine, Ser: 0.63 mg/dL (ref 0.44–1.00)
GFR calc non Af Amer: >60 mL/min (ref >60)
Glucose, Bld: 155 mg/dL — ABNORMAL HIGH (ref 65–99)
Potassium: 4.2 mmol/L (ref 3.5–5.1)
Sodium: 139 mmol/L (ref 135–145)

## 2015-06-30 NOTE — Progress Notes (Signed)
Physical Therapy Treatment Patient Details Name: Anne James MRN: [id number] DOB: 03/15/1970 Today's Date: 06/30/2015    History of Present Illness 45 y.o. female now s/p Lt TKA. PMH: depression, anxiety, gastric bypass.     PT Comments    Patient progressing well toward mobility goals and with improved ROM. Continue to progress as tolerated with anticipated d/c home with assistance from husband.   Follow Up Recommendations  Outpatient PT;Supervision for mobility/OOB     Equipment Recommendations  None recommended by PT (pt reports having rw already arranged. )    Recommendations for Other Services       Precautions / Restrictions Precautions Precautions: Knee;Fall Precaution Booklet Issued: Yes (comment) Precaution Comments: HEP provided, reviewed knee extension and no pillow under knee.  Knee Immobilizer - Left: Discontinue once straight leg raise with < 10 degree lag Restrictions Weight Bearing Restrictions: Yes LLE Weight Bearing: Weight bearing as tolerated    Mobility  Bed Mobility Overal bed mobility: Needs Assistance Bed Mobility: Supine to Sit;Sit to Supine     Supine to sit: Supervision Sit to supine: Min guard   General bed mobility comments: cues for sequencing; HOB flat and no use of bedrails; min guard/supervision for safety; no physical assist needed  Transfers Overall transfer level: Needs assistance Equipment used: Rolling walker (2 wheeled) Transfers: Sit to/from Stand Sit to Stand: Supervision         General transfer comment: from EOB; cues for hand placement  Ambulation/Gait Ambulation/Gait assistance: Supervision Ambulation Distance (Feet): 75 Feet Assistive device: Rolling walker (2 wheeled) Gait Pattern/deviations: Step-through pattern;Decreased stride length;Decreased weight shift to left Gait velocity: decreased   General Gait Details: cues for posture and sequencing of gait with use of AD; slow and steady gait with no knee  buckling   Stairs            Wheelchair Mobility    Modified Rankin (Stroke Patients Only)       Balance Overall balance assessment: Needs assistance   Sitting balance-Leahy Scale: Good       Standing balance-Leahy Scale: Fair                      Cognition Arousal/Alertness: Awake/alert Behavior During Therapy: WFL for tasks assessed/performed Overall Cognitive Status: Within Functional Limits for tasks assessed                      Exercises Total Joint Exercises Quad Sets: AROM;Left;10 reps;Supine Heel Slides: AROM;Left;10 reps;Supine Hip ABduction/ADduction: AROM;Left;10 reps;Supine Straight Leg Raises: AROM;Left;10 reps;Supine Goniometric ROM: 0-85    General Comments        Pertinent Vitals/Pain Pain Assessment: 0-10 Pain Score: 4  Pain Location: L knee Pain Descriptors / Indicators: Sore Pain Intervention(s): Limited activity within patient's tolerance;Monitored during session;Premedicated before session;Repositioned;Ice applied    Home Living                      Prior Function            PT Goals (current goals can now be found in the care plan section) Acute Rehab PT Goals Patient Stated Goal: go home PT Goal Formulation: With patient Time For Goal Achievement: 07/13/15 Potential to Achieve Goals: Good Progress towards PT goals: Progressing toward goals    Frequency  7X/week    PT Plan Current plan remains appropriate    Co-evaluation             End  of Session Equipment Utilized During Treatment: Gait belt;Left knee immobilizer Activity Tolerance: Patient tolerated treatment well Patient left: in chair;with family/visitor present;with call bell/phone within reach     Time: 1020-1055 PT Time Calculation (min) (ACUTE ONLY): 35 min  Charges:  $Gait Training: 8-22 mins $Therapeutic Exercise: 8-22 mins                    G Codes:      Derek MoundKellyn R Reann Dobias Tamme Mozingo, PTA Pager: [phone]  [phone]   06/30/2015, 11:05 AM

## 2015-06-30 NOTE — Progress Notes (Signed)
Orthopedic Tech Progress Note Patient Details:  Anne FlavorsLori D Bayus Dec 08, 1970 [id number]  CPM Left Knee CPM Left Knee: On Left Knee Flexion (Degrees): 90 Left Knee Extension (Degrees): 0 Additional Comments: 1453   Saul FordyceJennifer C Jaydn Fincher 06/30/2015, 6:39 PM

## 2015-06-30 NOTE — Evaluation (Signed)
Occupational Therapy Evaluation Patient Details Name: Anne James MRN: [id number] DOB: 16-Dec-1970 Today's Date: 06/30/2015    History of Present Illness [age] y.o. female now s/p Lt TKA. PMH: depression, anxiety, gastric bypass.    Clinical Impression   This 45 yo female admitted and underwent above presents to acute OT with all education completed. No further OT needs, we will sign off.    Follow Up Recommendations  No OT follow up    Equipment Recommendations  3 in 1 bedside comode       Precautions / Restrictions Precautions Precautions: Knee;Fall Precaution Booklet Issued: Yes (comment)  Required Braces or Orthoses: Knee Immobilizer - Left (D/C'd pt can straight leg raise with <10 degree lag)  Restrictions Weight Bearing Restrictions: No LLE Weight Bearing: Weight bearing as tolerated      Mobility Bed Mobility Overal bed mobility: Modified Independent Bed Mobility: Supine to Sit     Supine to sit: Modified independent (Device/Increase time);HOB elevated    Transfers Overall transfer level: Needs assistance Equipment used: Rolling walker (2 wheeled) Transfers: Sit to/from Stand Sit to Stand: Supervision         General transfer comment: from EOB; cues for hand placement    Balance Overall balance assessment: Needs assistance   Sitting balance-Leahy Scale: Good       Standing balance-Leahy Scale: Fair                              ADL Overall ADL's : Needs assistance/impaired Eating/Feeding: Independent;Sitting   Grooming: Set up;Sitting   Upper Body Bathing: Set up;Sitting   Lower Body Bathing: Minimal assistance (S sit<>stand)   Upper Body Dressing : Set up;Sitting   Lower Body Dressing: Moderate assistance (S sit<>stand)   Toilet Transfer: Min guard;Ambulation;RW (bed>recliner>3n1 in tub>walk with PT)   Toileting- Clothing Manipulation and Hygiene: Supervision/safety;Sit to/from stand   Tub/ Shower Transfer: Min  guard;Ambulation;Rolling walker;3 in 1;Tub transfer     General ADL Comments: Pt educated on the most efficient sequence of dressing and which leg should go in pants/underwear first               Pertinent Vitals/Pain Pain Assessment: 0-10 Pain Score: 4  Pain Location: left knee Pain Descriptors / Indicators: Sore;Aching Pain Intervention(s): Monitored during session;Repositioned     Hand Dominance Right   Extremity/Trunk Assessment Upper Extremity Assessment Upper Extremity Assessment: Overall WFL for tasks assessed           Communication Communication Communication: No difficulties   Cognition Arousal/Alertness: Awake/alert Behavior During Therapy: WFL for tasks assessed/performed Overall Cognitive Status: Within Functional Limits for tasks assessed                                Home Living Family/patient expects to be discharged to:: Private residence Living Arrangements: Spouse/significant other Available Help at Discharge: Family;Available 24 hours/day Type of Home: House Home Access: Ramped entrance     Home Layout: One level     Bathroom Shower/Tub: Tub/shower unit;Curtain Shower/tub characteristics: Engineer, building servicesCurtain Bathroom Toilet: Standard     Home Equipment: Environmental consultantWalker - 2 wheels          Prior Functioning/Environment Level of Independence: Independent             OT Diagnosis: Generalized weakness;Acute pain         OT Goals(Current goals can be found in the care  plan section) Acute Rehab OT Goals Patient Stated Goal: home tomorrow  OT Frequency:                End of Session Equipment Utilized During Treatment: Gait belt;Rolling walker  Activity Tolerance: Patient tolerated treatment well Patient left:  (walkiing with PT back to her room)   Time: 1349-1405 OT Time Calculation (min): 16 min Charges:  OT General Charges $OT Visit: 1 Procedure OT Evaluation $OT Eval Moderate Complexity: 1 Procedure Evette Georges [phone] 06/30/2015, 2:14 PM

## 2015-06-30 NOTE — Progress Notes (Signed)
Physical Therapy Treatment Patient Details Name: Anne James MRN: [id number] DOB: [birth year] Today's Date: 06/30/2015    History of Present Illness 45 y.o. female now s/p Lt TKA. PMH: depression, anxiety, gastric bypass.     PT Comments    Patient continues to progress toward mobility goals. Current plan remains appropriate.   Follow Up Recommendations  Outpatient PT;Supervision for mobility/OOB     Equipment Recommendations  None recommended by PT (pt reports having rw already arranged. )    Recommendations for Other Services       Precautions / Restrictions Precautions Precautions: Knee;Fall Precaution Booklet Issued: Yes (comment) Precaution Comments: HEP provided, reviewed knee extension and no pillow under knee.  Required Braces or Orthoses: Knee Immobilizer - Left (D/C'd pt can straight leg raise with <10 degree lag) Knee Immobilizer - Left: Other (comment) (discontinued) Restrictions Weight Bearing Restrictions: Yes LLE Weight Bearing: Weight bearing as tolerated    Mobility  Bed Mobility Overal bed mobility: Needs Assistance Bed Mobility: Sit to Supine     Supine to sit: Modified independent (Device/Increase time);HOB elevated Sit to supine: Supervision   General bed mobility comments: supervision for safety and increased time needed; received standing with OT in therapy gym  Transfers Overall transfer level: Needs assistance Equipment used: Rolling walker (2 wheeled) Transfers: Sit to/from Stand Sit to Stand: Supervision         General transfer comment: returned EOB; supervision for safety; cues for hand placement  Ambulation/Gait Ambulation/Gait assistance: Supervision Ambulation Distance (Feet): 150 Feet Assistive device: Rolling walker (2 wheeled) Gait Pattern/deviations: Step-through pattern;Decreased stride length;Decreased weight shift to left;Trunk flexed Gait velocity: decreased   General Gait Details: cues for posture and L knee  flexion during swing phase; pt with improved bilat step length and ability to WB on L LE this session   Stairs            Wheelchair Mobility    Modified Rankin (Stroke Patients Only)       Balance Overall balance assessment: Needs assistance   Sitting balance-Leahy Scale: Good       Standing balance-Leahy Scale: Fair                      Cognition Arousal/Alertness: Awake/alert Behavior During Therapy: WFL for tasks assessed/performed Overall Cognitive Status: Within Functional Limits for tasks assessed                      Exercises Total Joint Exercises Quad Sets: AROM;Left;10 reps;Supine Heel Slides: AROM;Left;10 reps;Supine Hip ABduction/ADduction: AROM;Left;10 reps;Supine Straight Leg Raises: AROM;Left;10 reps;Supine Long Arc Quad: AROM;Left;10 reps;Seated Knee Flexion: AROM;Left;10 reps;Seated Goniometric ROM: 0-85    General Comments        Pertinent Vitals/Pain Pain Assessment: 0-10 Pain Score: 4  Pain Location: L knee Pain Descriptors / Indicators: Sore Pain Intervention(s): Monitored during session;Repositioned    Home Living Family/patient expects to be discharged to:: Private residence Living Arrangements: Spouse/significant other Available Help at Discharge: Family;Available 24 hours/day Type of Home: House Home Access: Ramped entrance   Home Layout: One level Home Equipment: Walker - 2 wheels      Prior Function Level of Independence: Independent          PT Goals (current goals can now be found in the care plan section) Acute Rehab PT Goals Patient Stated Goal: go home PT Goal Formulation: With patient Time For Goal Achievement: 07/13/15 Potential to Achieve Goals: Good Progress towards PT goals: Progressing toward  goals    Frequency  7X/week    PT Plan Current plan remains appropriate    Co-evaluation             End of Session Equipment Utilized During Treatment: Gait belt;Left knee  immobilizer Activity Tolerance: Patient tolerated treatment well Patient left: in chair;with family/visitor present;with call bell/phone within reach     Time: 6962-9528 PT Time Calculation (min) (ACUTE ONLY): 13 min  Charges:  $Gait Training: 8-22 mins $Therapeutic Exercise: 8-22 mins                    G Codes:      Derek Mound, PTA Pager: [phone]   06/30/2015, 2:36 PM

## 2015-06-30 NOTE — Progress Notes (Signed)
Subjective: 1 Day Post-Op Procedure(s) (LRB): LEFT TOTAL KNEE ARTHROPLASTY (Left) Patient reports pain as 8 on 0-10 scale.    Objective: Vital signs in last 24 hours: Temp:  [97.2 F (36.2 C)-98.2 F (36.8 C)] 98.2 F (36.8 C) (04/18 0550) Pulse Rate:  [70-86] 73 (04/18 0550) Resp:  [10-16] 14 (04/18 0550) BP: (112-135)/(58-80) 127/80 mmHg (04/18 0550) SpO2:  [96 %-100 %] 98 % (04/18 0550)  Intake/Output from previous day: 04/17 0701 - 04/18 0700 In: 2440 [P.O.:240; I.V.:2200] Out: 2700 [Urine:2650; Blood:50] Intake/Output this shift: Total I/O In: -  Out: 1450 [Urine:1450]   Recent Labs  06/29/15 1227  HGB 10.7*    Recent Labs  06/29/15 1227  WBC 12.2*  RBC 4.27  HCT 35.4*  PLT 351    Recent Labs  06/29/15 1227  CREATININE 0.64   No results for input(s): LABPT, INR in the last 72 hours.  ABD soft Neurovascular intact Sensation intact distally Intact pulses distally Dorsiflexion/Plantar flexion intact Incision: dressing C/D/I  Assessment/Plan: 1 Day Post-Op Procedure(s) (LRB): LEFT TOTAL KNEE ARTHROPLASTY (Left)  Principal Problem:   Primary localized osteoarthritis of left knee Active Problems:   Osteoarthritis of both knees   Depression   Anxiety state  Advance diet Up with therapy D/C IV fluids  Continue PT with the expectation of discharge on Wednesday or Thursday.  Today she has walked 8 feet  Tigran Haynie J 06/30/2015, 9:08 AM

## 2015-06-30 NOTE — Care Management Note (Signed)
Case Management Note  Patient Details  Name: Anne James MRN: [id number] Date of Birth: [birth year]  Subjective/Objective:         S/p left total knee arthroplasty           Action/Plan: Set up for outpatient PT at [facility] on 07/03/15 at 1:30pm by MD office. [employer] will deliver CPM to home and 3N1 to patient's room. Patient already has a rolling walker. Her husband and children will be assisting her after discharge.   Expected Discharge Date:                  Expected Discharge Plan:  Home/Self Care  In-House Referral:  NA  Discharge planning Services  CM Consult  Post Acute Care Choice:  Durable Medical Equipment Choice offered to:  Patient  DME Arranged:  3-N-1, CPM DME Agency:  [employer]  HH Arranged:  NA HH Agency:     Status of Service:  Completed, signed off  Medicare Important Message Given:    Date Medicare IM Given:    Medicare IM give by:    Date Additional Medicare IM Given:    Additional Medicare Important Message give by:     If discussed at Long Length of Stay Meetings, dates discussed:    Additional Comments:  Anne James, Anne Devore Watson, RN 06/30/2015, 2:03 PM

## 2015-07-01 LAB — BASIC METABOLIC PANEL
Anion gap: 7 (ref 5–15)
BUN: 10 mg/dL (ref 6–20)
CALCIUM: 8.7 mg/dL — AB (ref 8.9–10.3)
CHLORIDE: 103 mmol/L (ref 101–111)
CO2: 29 mmol/L (ref 22–32)
CREATININE: 0.55 mg/dL (ref 0.44–1.00)
GFR calc Af Amer: >60 mL/min (ref >60)
GFR calc non Af Amer: >60 mL/min (ref >60)
GLUCOSE: 106 mg/dL — AB (ref 65–99)
Potassium: 4 mmol/L (ref 3.5–5.1)
Sodium: 139 mmol/L (ref 135–145)

## 2015-07-01 LAB — CBC
HEMATOCRIT: 30.7 % — AB (ref 36.0–46.0)
HEMOGLOBIN: 9.5 g/dL — AB (ref 12.0–15.0)
MCH: 25.7 pg — ABNORMAL LOW (ref 26.0–34.0)
MCHC: 30.9 g/dL (ref 30.0–36.0)
MCV: 83.2 fL (ref 78.0–100.0)
Platelets: 356 10*3/uL (ref 150–400)
RBC: 3.69 MIL/uL — ABNORMAL LOW (ref 3.87–5.11)
RDW: 15.8 % — AB (ref 11.5–15.5)
WBC: 11.5 10*3/uL — ABNORMAL HIGH (ref 4.0–10.5)

## 2015-07-01 MED ORDER — OXYCODONE HCL 5 MG PO TABS: ORAL_TABLET | ORAL | Status: DC

## 2015-07-01 MED ORDER — POLYETHYLENE GLYCOL 3350 17 G PO PACK: PACK | ORAL | Status: DC

## 2015-07-01 MED ORDER — DOCUSATE SODIUM 100 MG PO CAPS: ORAL_CAPSULE | ORAL | Status: DC

## 2015-07-01 MED ORDER — ENOXAPARIN SODIUM 30 MG/0.3ML ~~LOC~~ SOLN: 30.0000 mg | Freq: Two times a day (BID) | SUBCUTANEOUS | Status: DC

## 2015-07-01 NOTE — Anesthesia Postprocedure Evaluation (Signed)
Anesthesia Post Note  Patient: Anne FlavorsLori D Cirillo  Procedure(s) Performed: Procedure(s) (LRB): LEFT TOTAL KNEE ARTHROPLASTY (Left)  Patient location during evaluation: PACU Anesthesia Type: General Level of consciousness: awake Pain management: pain level controlled Vital Signs Assessment: post-procedure vital signs reviewed and stable Respiratory status: spontaneous breathing Cardiovascular status: stable Postop Assessment: no signs of nausea or vomiting Anesthetic complications: no    Last Vitals:  Filed Vitals:   06/30/15 1929 07/01/15 0634  BP: 136/81 149/77  Pulse: 73 81  Temp: 36.9 C 36.7 C  Resp: 18 16    Last Pain:  Filed Vitals:   07/01/15 0921  PainSc: 4                  Paulanthony Gleaves

## 2015-07-01 NOTE — Progress Notes (Signed)
Physical Therapy Treatment Patient Details Name: Anne James MRN: [id number] DOB: 01-12-1971 Today's Date: 07/01/2015    History of Present Illness 45 y.o. female now s/p Lt TKA. PMH: depression, anxiety, gastric bypass.     PT Comments    Pt performed increased gait distance and mobility.  Review HEP with pt, pt able to verbalize and demonstrate understanding.  Pt set for d/c home today.  Follow Up Recommendations  Outpatient PT;Supervision for mobility/OOB     Equipment Recommendations  None recommended by PT (Pt has RW at home)    Recommendations for Other Services       Precautions / Restrictions Precautions Precautions: Knee;Fall Precaution Comments: HEP provided, reviewed knee extension and no pillow under knee.  Knee Immobilizer - Left:  (discontinued.) Restrictions Weight Bearing Restrictions: Yes LLE Weight Bearing: Weight bearing as tolerated    Mobility  Bed Mobility Overal bed mobility: Modified Independent Bed Mobility: Sit to Supine     Supine to sit: Modified independent (Device/Increase time) Sit to supine: Modified independent (Device/Increase time)   General bed mobility comments: Good technique, no assist or cues required.    Transfers Overall transfer level: Modified independent Equipment used: Rolling walker (2 wheeled) Transfers: Sit to/from Stand Sit to Stand: Modified independent (Device/Increase time)         General transfer comment: Pt performed with good technique, no cues or assistance needed.    Ambulation/Gait Ambulation/Gait assistance: Supervision Ambulation Distance (Feet): 400 Feet Assistive device: Rolling walker (2 wheeled) Gait Pattern/deviations: Step-through pattern;Antalgic;Decreased stride length;Decreased stance time - left Gait velocity: decreased   General Gait Details: Cues for posture and continuous movement with gait sequencing.     Stairs            Wheelchair Mobility    Modified Rankin  (Stroke Patients Only)       Balance Overall balance assessment: Needs assistance Sitting-balance support: No upper extremity supported Sitting balance-Leahy Scale: Good       Standing balance-Leahy Scale: Fair                      Cognition Arousal/Alertness: Awake/alert Behavior During Therapy: WFL for tasks assessed/performed Overall Cognitive Status: Within Functional Limits for tasks assessed                      Exercises Total Joint Exercises Ankle Circles/Pumps: AROM;20 reps;Supine Quad Sets: AROM;Left;10 reps;Supine Towel Squeeze: AROM;Both;10 reps;Supine Short Arc Quad: AROM;10 reps;Supine;Left Heel Slides: AROM;10 reps;Supine;Left Hip ABduction/ADduction: AROM;10 reps;Supine;Left Straight Leg Raises: AROM;Left;10 reps;Supine Long Arc Quad: AROM;Left;10 reps;Seated Knee Flexion: AROM;5 reps;Seated (2x5 reps with 10 sec holds.  ) Goniometric ROM: 0-95 grossly    General Comments        Pertinent Vitals/Pain Pain Score: 7  Pain Location: L knee Pain Descriptors / Indicators: Guarding;Grimacing Pain Intervention(s): Limited activity within patient's tolerance;Repositioned;Ice applied    Home Living                      Prior Function            PT Goals (current goals can now be found in the care plan section) Acute Rehab PT Goals Patient Stated Goal: go home Potential to Achieve Goals: Good Progress towards PT goals: Progressing toward goals    Frequency  7X/week    PT Plan Current plan remains appropriate    Co-evaluation             End  of Session Equipment Utilized During Treatment: Gait belt;Left knee immobilizer Activity Tolerance: Patient tolerated treatment well Patient left: in chair;with family/visitor present;with call bell/phone within reach     Time: 0915-0951 PT Time Calculation (min) (ACUTE ONLY): 36 min  Charges:  $Gait Training: 8-22 mins $Therapeutic Exercise: 8-22 mins                     G Codes:      Anne James 07/01/2015, 10:02 AM Joycelyn RuaAimee Ajmal Kathan, PTA pager [phone]

## 2015-07-01 NOTE — Progress Notes (Signed)
Orthopedic Tech Progress Note Patient Details:  Anne James 02/19/1971 [id number]  Patient ID: Anne James, female   DOB: 02/19/1971, 45 y.o.   MRN: [id number] Pt was up in chair. Will get in cpm when gets back in bad.  Trinna PostMartinez, Romina Divirgilio J 07/01/2015, 5:53 AM

## 2015-07-01 NOTE — Progress Notes (Signed)
Went over all discharge orders and S?S of infection with patient

## 2015-07-02 NOTE — Discharge Summary (Signed)
Patient ID: Anne James MRN: [id number] DOB/AGE: 47-Dec-1972 47 y.o.  Admit date: 06/29/2015 Discharge date: 07/01/2015 Admission Diagnoses:  Principal Problem:   Primary localized osteoarthritis of left knee Active Problems:   Osteoarthritis of both knees   Depression   Anxiety state   Discharge Diagnoses:  Same  Past Medical History  Diagnosis Date  . Chicken pox   . Depression   . Eating disorder   . Phlebitis   . 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 Primary localized osteoarthritis of left knee   . Anxiety     Surgeries: Procedure(s): LEFT TOTAL KNEE ARTHROPLASTY on 06/29/2015   Consultants:    Discharged Condition: Improved  Hospital Course: Anne James is an 45 y.o. female who was admitted 06/29/2015 for operative treatment ofPrimary localized osteoarthritis of left knee. Patient has severe unremitting pain that affects sleep, daily activities, and work/hobbies. After pre-op clearance the patient was taken to the operating room on 06/29/2015 and underwent  Procedure(s): LEFT TOTAL KNEE ARTHROPLASTY.    Patient was given perioperative antibiotics: Anti-infectives    Start     Dose/Rate Route Frequency Ordered Stop   06/29/15 1330  ceFAZolin (ANCEF) IVPB 2g/100 mL premix     2 g 200 mL/hr over 30 Minutes Intravenous Every 6 hours 06/29/15 1218 06/29/15 2106   06/29/15 0700  ceFAZolin (ANCEF) IVPB 2g/100 mL premix     2 g 200 mL/hr over 30 Minutes Intravenous To [facility] 06/26/15 1358 06/29/15 0723       Patient was given sequential compression devices, early ambulation, and chemoprophylaxis to prevent DVT.  Patient benefited maximally from hospital stay and there were no complications.    Recent vital signs: No data found.    Recent laboratory studies:  Recent Labs  06/30/15 0757 07/01/15 0422  WBC 11.8* 11.5*  HGB 10.5* 9.5*  HCT 34.9* 30.7*  PLT 374 356  NA 139 139  K 4.2 4.0   CL 104 103  CO2 24 29  BUN <5* 10  CREATININE 0.63 0.55  GLUCOSE 155* 106*  CALCIUM 9.0 8.7*     Discharge Medications:     Medication List    STOP taking these medications        HYDROcodone-acetaminophen 7.5-325 MG tablet  Commonly known as:  NORCO      TAKE these medications        buPROPion 150 MG 24 hr tablet  Commonly known as:  WELLBUTRIN XL  Take 150 mg by mouth daily.     celecoxib 200 MG capsule  Commonly known as:  CELEBREX  Take 1 capsule by mouth  daily     cyanocobalamin 500 MCG tablet  Take 500 mcg by mouth daily.     docusate sodium 100 MG capsule  Commonly known as:  COLACE  1 tab 2 times a day while on narcotics.  STOOL SOFTENER     enoxaparin 30 MG/0.3ML injection  Commonly known as:  LOVENOX  Inject 0.3 mLs (30 mg total) into the skin every 12 (twelve) hours.     FLUoxetine 40 MG capsule  Commonly known as:  PROZAC  Take 1 capsule (40 mg total) by mouth daily.     multivitamin tablet  Take 1 tablet by mouth daily.     oxyCODONE 5 MG immediate release tablet  Commonly known as:  Oxy IR/ROXICODONE  1-2 tablets every 4-6 hrs as needed for  pain     polyethylene glycol packet  Commonly known as:  MIRALAX / GLYCOLAX  17grams in 6 oz of water twice a day until bowel movement.  LAXITIVE.  Restart if two days since last bowel movement     Vitamin D-3 5000 UNITS Tabs  Take 1 tablet by mouth daily.        Diagnostic Studies: No results found.  Disposition: 01-Home or Self Care      Discharge Instructions    CPM    Complete by:  As directed   Continuous passive motion machine (CPM):      Use the CPM from 0 to 90 for 6 hours per day.       You may break it up into 2 or 3 sessions per day.      Use CPM for 2 weeks or until you are told to stop.     Call MD / Call 911    Complete by:  As directed   If you experience chest pain or shortness of breath, CALL 911 and be transported to the hospital emergency room.  If you develope a fever  above 101 F, pus (white drainage) or increased drainage or redness at the wound, or calf pain, call your surgeon's office.     Change dressing    Complete by:  As directed   Change the gauze dressing daily with sterile 4 x 4 inch gauze and apply TED hose.  DO NOT REMOVE BANDAGE OVER SURGICAL INCISION.  WASH WHOLE LEG INCLUDING OVER THE WATERPROOF BANDAGE WITH SOAP AND WATER EVERY DAY.     Constipation Prevention    Complete by:  As directed   Drink plenty of fluids.  Prune juice may be helpful.  You may use a stool softener, such as Colace (over the counter) 100 mg twice a day.  Use MiraLax (over the counter) for constipation as needed.     Diet - low sodium heart healthy    Complete by:  As directed      Discharge instructions    Complete by:  As directed   INSTRUCTIONS AFTER JOINT REPLACEMENT   Remove items at home which could result in a fall. This includes throw rugs or furniture in walking pathways ICE to the affected joint every three hours while awake for 30 minutes at a time, for at least the first 3-5 days, and then as needed for pain and swelling.  Continue to use ice for pain and swelling. You may notice swelling that will progress down to the foot and ankle.  This is normal after surgery.  Elevate your leg when you are not up walking on it.   Continue to use the breathing machine you got in the hospital (incentive spirometer) which will help keep your temperature down.  It is common for your temperature to cycle up and down following surgery, especially at night when you are not up moving around and exerting yourself.  The breathing machine keeps your lungs expanded and your temperature down.   DIET:  As you were doing prior to hospitalization, we recommend a well-balanced diet.  DRESSING / WOUND CARE / SHOWERING  Keep the surgical dressing until follow up.  The dressing is water proof, so you can shower without any extra covering.  IF THE DRESSING FALLS OFF or the wound gets wet  inside, change the dressing with sterile gauze.  Please use good hand washing techniques before changing the dressing.  Do not use any lotions  or creams on the incision until instructed by your surgeon.    ACTIVITY  Increase activity slowly as tolerated, but follow the weight bearing instructions below.   No driving for 6 weeks or until further direction given by your physician.  You cannot drive while taking narcotics.  No lifting or carrying greater than 10 lbs. until further directed by your surgeon. Avoid periods of inactivity such as sitting longer than an hour when not asleep. This helps prevent blood clots.  You may return to work once you are authorized by your doctor.     WEIGHT BEARING   Weight bearing as tolerated with assist device (walker, cane, etc) as directed, use it as long as suggested by your surgeon or therapist, typically at least 2-3 weeks.   EXERCISES  Results after joint replacement surgery are often greatly improved when you follow the exercise, range of motion and muscle strengthening exercises prescribed by your doctor. Safety measures are also important to protect the joint from further injury. Any time any of these exercises cause you to have increased pain or swelling, decrease what you are doing until you are comfortable again and then slowly increase them. If you have problems or questions, call your caregiver or physical therapist for advice.   Rehabilitation is important following a joint replacement. After just a few days of immobilization, the muscles of the leg can become weakened and shrink (atrophy).  These exercises are designed to build up the tone and strength of the thigh and leg muscles and to improve motion. Often times heat used for twenty to thirty minutes before working out will loosen up your tissues and help with improving the range of motion but do not use heat for the first two weeks following surgery (sometimes heat can increase post-operative  swelling).   These exercises can be done on a training (exercise) mat, on the floor, on a table or on a bed. Use whatever works the best and is most comfortable for you.    Use music or television while you are exercising so that the exercises are a pleasant break in your day. This will make your life better with the exercises acting as a break in your routine that you can look forward to.   Perform all exercises about fifteen times, three times per day or as directed.  You should exercise both the operative leg and the other leg as well.   Exercises include:  Quad Sets - Tighten up the muscle on the front of the thigh (Quad) and hold for 5-10 seconds.   Straight Leg Raises - With your knee straight (if you were given a brace, keep it on), lift the leg to 60 degrees, hold for 3 seconds, and slowly lower the leg.  Perform this exercise against resistance later as your leg gets stronger.  Leg Slides: Lying on your back, slowly slide your foot toward your buttocks, bending your knee up off the floor (only go as far as is comfortable). Then slowly slide your foot back down until your leg is flat on the floor again.  Angel Wings: Lying on your back spread your legs to the side as far apart as you can without causing discomfort.  Hamstring Strength:  Lying on your back, push your heel against the floor with your leg straight by tightening up the muscles of your buttocks.  Repeat, but this time bend your knee to a comfortable angle, and push your heel against the floor.  You may put  a pillow under the heel to make it more comfortable if necessary.   A rehabilitation program following joint replacement surgery can speed recovery and prevent re-injury in the future due to weakened muscles. Contact your doctor or a physical therapist for more information on knee rehabilitation.    CONSTIPATION  Constipation is defined medically as fewer than three stools per week and severe constipation as less than one stool  per week.  Even if you have a regular bowel pattern at home, your normal regimen is likely to be disrupted due to multiple reasons following surgery.  Combination of anesthesia, postoperative narcotics, change in appetite and fluid intake all can affect your bowels.   YOU MUST use at least one of the following options; they are listed in order of increasing strength to get the job done.  They are all available over the counter, and you may need to use some, POSSIBLY even all of these options:    Drink plenty of fluids (prune juice may be helpful) and high fiber foods Colace 100 mg by mouth twice a day  Senokot for constipation as directed and as needed Dulcolax (bisacodyl), take with full glass of water  Miralax (polyethylene glycol) once or twice a day as needed.  If you have tried all these things and are unable to have a bowel movement in the first 3-4 days after surgery call either your surgeon or your primary doctor.    If you experience loose stools or diarrhea, hold the medications until you stool forms back up.  If your symptoms do not get better within 1 week or if they get worse, check with your doctor.  If you experience "the worst abdominal pain ever" or develop nausea or vomiting, please contact the office immediately for further recommendations for treatment.   ITCHING:  If you experience itching with your medications, try taking only a single pain pill, or even half a pain pill at a time.  You can also use Benadryl over the counter for itching or also to help with sleep.   TED HOSE STOCKINGS:  Use stockings on both legs until for at least 2 weeks or as directed by physician office. They may be removed at night for sleeping.  MEDICATIONS:  See your medication summary on the "After Visit Summary" that nursing will review with you.  You may have some home medications which will be placed on hold until you complete the course of blood thinner medication.  It is important for you to  complete the blood thinner medication as prescribed.  PRECAUTIONS:  If you experience chest pain or shortness of breath - call 911 immediately for transfer to the hospital emergency department.   If you develop a fever greater that 101 F, purulent drainage from wound, increased redness or drainage from wound, foul odor from the wound/dressing, or calf pain - CONTACT YOUR SURGEON.                                                   FOLLOW-UP APPOINTMENTS:  If you do not already have a post-op appointment, please call the office for an appointment to be seen by your surgeon.  Guidelines for how soon to be seen are listed in your "After Visit Summary", but are typically between 1-4 weeks after surgery.  OTHER INSTRUCTIONS:   Knee Replacement:  Do not place pillow under knee, focus on keeping the knee straight while resting. CPM instructions: 0-90 degrees, 2 hours in the morning, 2 hours in the afternoon, and 2 hours in the evening. Place foam block, curve side up under heel at all times except when in CPM or when walking.  DO NOT modify, tear, cut, or change the foam block in any way.  MAKE SURE YOU:  Understand these instructions.  Get help right away if you are not doing well or get worse.    Thank you for letting us be a part of your medical care team.  It is a privilege we respect greatly.  We hope these instructions will help you stay on track for a fast and full recovery!     Do not put a pillow under the knee. Place it under the heel.    Complete by:  As directed   Place gray foam block, curve side up under heel at all times except when in CPM or when walking.  DO NOT modify, tear, cut, or change in any way the gray foam block.     Increase activity slowly as tolerated    Complete by:  As directed      Patient may shower    Complete by:  As directed   Aquacel dressing is water proof    Wash over it and the whole leg with soap and water at the end of your shower     TED hose    Complete  by:  As directed   Use stockings (TED hose) for 2 weeks on both leg(s).  You may remove them at night for sleeping.           Follow-up Information    Follow [facility] with [facility] On 07/03/2015.   Why:  appt time 1:30 pm arrive between 1 and 1 :15 for appt   Contact information:   [address] [place], [place]  [address] [phone]      Follow up with Nilda SimmerWAINER,ROBERT A, MD On 07/13/2015.   Specialty:  Orthopedic Surgery   Why:  as scheduled for suture removal and xray   Contact information:   [address] [place] [address] [phone]        Signed: Pascal LuxSHEPPERSON,Kailah Pennel J 07/02/2015, 9:17 AM

## 2015-08-03 ENCOUNTER — Ambulatory Visit: Payer: Managed Care, Other (non HMO) | Admitting: Internal Medicine

## 2015-09-21 ENCOUNTER — Ambulatory Visit (INDEPENDENT_AMBULATORY_CARE_PROVIDER_SITE_OTHER): Payer: Managed Care, Other (non HMO) | Admitting: Internal Medicine

## 2015-09-21 ENCOUNTER — Encounter: Payer: Self-pay | Admitting: Internal Medicine

## 2015-09-21 VITALS — BP 140/90 | HR 78 | Ht 65.0 in | Wt 186.1 lb

## 2015-09-21 DIAGNOSIS — F411 Generalized anxiety disorder: Secondary | ICD-10-CM

## 2015-09-21 DIAGNOSIS — M170 Bilateral primary osteoarthritis of knee: Secondary | ICD-10-CM

## 2015-09-21 MED ORDER — CLONAZEPAM 0.5 MG PO TABS: 0.5000 mg | ORAL_TABLET | Freq: Two times a day (BID) | ORAL | Status: DC | PRN

## 2015-09-21 MED ORDER — CELECOXIB 200 MG PO CAPS: 200.0000 mg | ORAL_CAPSULE | Freq: Every day | ORAL | Status: DC

## 2015-09-21 NOTE — Progress Notes (Signed)
Subjective:    Patient ID: Anne James, female    DOB: [birth year], 45 y.o.   MRN: [id number]  HPI  45YO female presents for follow up.  S/p left knee replacement. Tolerated well. Completed PT. Planning to have right knee replaced this year.  Anxiety - having worsening anxiety over last few months. Worries about "everything." Work is stressful. Family life stressful. Son and daughter and grandchild living with her. Talks with pastor which is helpful.   Wt Readings from Last 3 Encounters:  09/21/15 186 lb 1.9 oz (84.423 kg)  06/30/15 184 lb 15.5 oz (83.9 kg)  06/19/15 185 lb 14.4 oz (84.324 kg)   BP Readings from Last 3 Encounters:  09/21/15 140/90  07/01/15 149/77  06/19/15 110/51    Past Medical History  Diagnosis Date  . Chicken pox   . Depression   . Eating disorder   . Phlebitis   . 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 Primary localized osteoarthritis of left knee   . Anxiety    Family History  Problem Relation Age of Onset  . Alcohol abuse Mother   . Drug abuse Mother   . Arthritis Maternal Grandmother   . Heart disease Maternal Grandmother   . Stroke Maternal Grandmother   . Hypertension Maternal Grandmother   . Kidney disease Maternal Grandmother   . Diabetes Maternal Grandmother   . Anxiety disorder Brother    Past Surgical History  Procedure Laterality Date  . Gastric bypass  [facility], Dr. Kennedy Bucker  . Tubal ligation    . Novasure ablation    . Total knee arthroplasty Left 06/29/2015    Procedure: LEFT TOTAL KNEE ARTHROPLASTY;  Surgeon: Salvatore Marvel, MD;  Location: [facility] OR;  Service: Orthopedics;  Laterality: Left;   Social History   Social History  . Marital Status: Married    Spouse Name: N/A  . Number of Children: N/A  . Years of Education: N/A   Social History Main Topics  . Smoking status: Never Smoker   . Smokeless tobacco: None  . Alcohol Use: 1.2 oz/week    2  Shots of liquor per week  . Drug Use: No  . Sexual Activity: Not Asked   Other Topics Concern  . None   Social History Narrative   Lives in [place].      Work - [employer]   Diet - Regular    Exercise - limited by right knee pain    Review of Systems  Constitutional: Negative for fever, chills, appetite change, fatigue and unexpected weight change.  Eyes: Negative for visual disturbance.  Respiratory: Negative for shortness of breath.   Cardiovascular: Negative for chest pain and leg swelling.  Gastrointestinal: Negative for nausea, vomiting, abdominal pain, diarrhea and constipation.  Musculoskeletal: Positive for arthralgias.  Skin: Negative for color change and rash.  Hematological: Negative for adenopathy. Does not bruise/bleed easily.  Psychiatric/Behavioral: Positive for behavioral problems, sleep disturbance, dysphoric mood, decreased concentration and agitation. Negative for suicidal ideas. The patient is nervous/anxious.        Objective:    BP 140/90 mmHg  Pulse 78  Ht  (1.651 m)  Wt 186 lb 1.9 oz (84.423 kg)  BMI 30.97 kg/m2  SpO2 98% Physical Exam  Constitutional: She is oriented to person, place, and time. She appears well-developed and well-nourished. No distress.  HENT:  Head: Normocephalic and atraumatic.  Right Ear: External ear normal.  Left Ear: External ear normal.  Nose: Nose normal.  Mouth/Throat: Oropharynx is clear and moist. No oropharyngeal exudate.  Eyes: Conjunctivae are normal. Pupils are equal, round, and reactive to light. Right eye exhibits no discharge. Left eye exhibits no discharge. No scleral icterus.  Neck: Normal range of motion. Neck supple. No tracheal deviation present. No thyromegaly present.  Cardiovascular: Normal rate, regular rhythm, normal heart sounds and intact distal pulses.  Exam reveals no gallop and no friction rub.   No murmur heard. Pulmonary/Chest: Effort normal and breath sounds normal. No accessory muscle  usage. No respiratory distress. She has no decreased breath sounds. She has no wheezes. She has no rhonchi. She has no rales. She exhibits no tenderness.  Musculoskeletal: Normal range of motion. She exhibits no edema or tenderness.  Lymphadenopathy:    She has no cervical adenopathy.  Neurological: She is alert and oriented to person, place, and time. No cranial nerve deficit. She exhibits normal muscle tone. Coordination normal.  Skin: Skin is warm and dry. No rash noted. She is not diaphoretic. No erythema. No pallor.  Psychiatric: Her speech is normal and behavior is normal. Judgment and thought content normal. Her mood appears anxious. She exhibits a depressed mood. She expresses no suicidal ideation.          Assessment & Plan:   Problem List Items Addressed This Visit      Unprioritized   Anxiety state - Primary    Worsening anxiety and depressed mood. Offered support today. Discussed some options with changes in medications. Will add Clonazepam 0.5mg  po bid prn for severe anxiety. Discussed potential risks of this medication. Follow up by email in 1 to 2 weeks and in visit in 4 weeks.      Osteoarthritis of both knees    Reviewed notes from recent hospitalization and knee replacement. Wound appears to have healed well. She is planning to have right knee replaced this year. Will continue Celebrex.      Relevant Medications   celecoxib (CELEBREX) 200 MG capsule       Return in about 4 weeks (around 10/19/2015) for New Patient.  Ronna PolioJennifer Walker, MD Internal Medicine [facility]

## 2015-09-21 NOTE — Assessment & Plan Note (Signed)
Worsening anxiety and depressed mood. Offered support today. Discussed some options with changes in medications. Will add Clonazepam 0.5mg  po bid prn for severe anxiety. Discussed potential risks of this medication. Follow up by email in 1 to 2 weeks and in visit in 4 weeks.

## 2015-09-21 NOTE — Progress Notes (Signed)
Pre visit review using our clinic review tool, if applicable. No additional management support is needed unless otherwise documented below in the visit note. 

## 2015-09-21 NOTE — Assessment & Plan Note (Signed)
Reviewed notes from recent hospitalization and knee replacement. Wound appears to have healed well. She is planning to have right knee replaced this year. Will continue Celebrex.

## 2015-09-21 NOTE — Patient Instructions (Addendum)
Start Clonazepam 0.5mg  twice daily as needed for anxiety.  Email in 1- 2weeks with update.  Follow up in 4 weeks.

## 2015-10-17 ENCOUNTER — Other Ambulatory Visit: Payer: Self-pay | Admitting: Internal Medicine

## 2015-10-22 ENCOUNTER — Telehealth: Payer: Self-pay | Admitting: Internal Medicine

## 2015-10-22 ENCOUNTER — Ambulatory Visit: Payer: Managed Care, Other (non HMO) | Admitting: Family Medicine

## 2015-10-22 NOTE — Telephone Encounter (Signed)
Pt not able to make appt today due to not able to get off work. Pt will call back to resch appt. Let me know if appt needs to be cancelled. Thank you!

## 2015-10-22 NOTE — Telephone Encounter (Signed)
Dr Antony HasteSonnenburg said to cancel appointment please. Thank you-aa

## 2015-10-22 NOTE — Telephone Encounter (Signed)
Ok   Done  Thank you

## 2015-11-27 ENCOUNTER — Telehealth: Payer: Self-pay | Admitting: Surgical

## 2015-11-27 MED ORDER — CLONAZEPAM 0.5 MG PO TABS: 0.5000 mg | ORAL_TABLET | Freq: Two times a day (BID) | ORAL | 0 refills | Status: DC | PRN

## 2015-11-27 NOTE — Telephone Encounter (Signed)
RX faxed and notified patient.

## 2015-11-27 NOTE — Telephone Encounter (Signed)
Spoke with patient about RX refill on the Clonazepam. She stated that she is out of the medication. I have scheduled her an appointment for Monday at 11:15. I advised that she would need to keep this appointment to continue to get medication filled due to Dr. Birdie SonsSonnenberg never seeing patient. Patient was very understanding about this.

## 2015-11-27 NOTE — Telephone Encounter (Signed)
Refill can be faxed to pharmacy. She should keep her appointment on Monday.

## 2015-11-30 ENCOUNTER — Encounter: Payer: Self-pay | Admitting: Family Medicine

## 2015-11-30 ENCOUNTER — Ambulatory Visit
Admission: RE | Admit: 2015-11-30 | Discharge: 2015-11-30 | Disposition: A | Discharge status: Home / Self Care | Payer: Managed Care, Other (non HMO) | Source: Ambulatory Visit | Attending: Family Medicine | Admitting: Family Medicine

## 2015-11-30 ENCOUNTER — Ambulatory Visit (INDEPENDENT_AMBULATORY_CARE_PROVIDER_SITE_OTHER): Payer: Managed Care, Other (non HMO) | Admitting: Family Medicine

## 2015-11-30 VITALS — BP 130/88 | HR 82 | Wt 188.6 lb

## 2015-11-30 DIAGNOSIS — M4312 Spondylolisthesis, cervical region: Secondary | ICD-10-CM | POA: Diagnosis not present

## 2015-11-30 DIAGNOSIS — E669 Obesity, unspecified: Secondary | ICD-10-CM

## 2015-11-30 DIAGNOSIS — M542 Cervicalgia: Secondary | ICD-10-CM | POA: Insufficient documentation

## 2015-11-30 DIAGNOSIS — M4602 Spinal enthesopathy, cervical region: Secondary | ICD-10-CM | POA: Diagnosis not present

## 2015-11-30 DIAGNOSIS — M4802 Spinal stenosis, cervical region: Secondary | ICD-10-CM | POA: Diagnosis not present

## 2015-11-30 DIAGNOSIS — F411 Generalized anxiety disorder: Secondary | ICD-10-CM

## 2015-11-30 MED ORDER — BUPROPION HCL ER (XL) 150 MG PO TB24: 150.0000 mg | ORAL_TABLET | Freq: Every day | ORAL | 3 refills | Status: DC

## 2015-11-30 NOTE — Progress Notes (Signed)
Marikay Alar, MD Phone: [phone]  Anne James is a 45 y.o. female who presents today for follow-up.  Anxiety: Patient notes she's got a lot going on. Has multiple adult children at home with a grandchild. She's been dealing with a knee replacement and is about to have another replaced. She was started on Klonopin previously and this helped ease her anxiety. She is taking it twice daily. Additionally taking Prozac and Wellbutrin. She had been off Wellbutrin for a while though felt as though she was becoming mean. Notes this is better controlled with these medications.  Obesity: Patient notes she's been working on her diet for this. Has decreased her Diet Coke intake. Drinking mostly water. Also trying to cut down on carbohydrates. Has difficulty exercising at this time as she is recently status post knee replacement.  Neck pain: Patient notes chronic intermittent neck pain and over the last month has had intermittent tingling in her left shoulder and neck. She's had no chest pain or shortness of breath with this. No numbness or weakness. No injury. Notes it can happen when she is up moving around or when she's sitting down. No prior imaging of her neck.  PMH: nonsmoker.   ROS see history of present illness  Objective  Physical Exam Vitals:   11/30/15 1117  BP: 130/88  Pulse: 82    BP Readings from Last 3 Encounters:  11/30/15 130/88  09/21/15 140/90  07/01/15 (!) 149/77   Wt Readings from Last 3 Encounters:  11/30/15 188 lb 9.6 oz (85.5 kg)  09/21/15 186 lb 1.9 oz (84.4 kg)  06/30/15 184 lb 15.5 oz (83.9 kg)    Physical Exam  Constitutional: No distress.  HENT:  Head: Normocephalic and atraumatic.  Cardiovascular: Normal rate, regular rhythm and normal heart sounds.   Pulmonary/Chest: Effort normal and breath sounds normal.  Musculoskeletal:  No midline spine tenderness, no midline spine step-off, mild muscular tenderness in her neck, negative Spurling's  bilaterally  Neurological: She is alert. Gait normal.  5 out of 5 strength in bilateral biceps, triceps, hand grip, sensation to light touch intact in bilateral upper extremities  Skin: Skin is warm and dry. She is not diaphoretic.     Assessment/Plan: Please see individual problem list.  Anxiety state Continues to have issues with this. Notes the Klonopin was beneficial though has been out for several days as her insurance would not cover it at [facility]. She notes her insurance is going to discuss with [facility] to get this covered until it comes from her mail order. I will refill her Wellbutrin as well.  Obesity (BMI 30-39.9) Advised on dietary changes. Difficult to exercise at this time given her orthopedic issues. We'll continue to work on this.  Neck pain Patient with neck pain and what appear to be paresthesias to her left shoulder over the last month. No neurological deficits in her upper extremities at this time. No cardiac symptoms. We'll check a cervical spine x-ray with follow-up consideration of physical therapy versus MRI. Given return precautions.   Orders Placed This Encounter  Procedures  . DG Cervical Spine Complete    Standing Status:   Future    Standing Expiration Date:   01/29/2017    Order Specific Question:   Reason for Exam (SYMPTOM  OR DIAGNOSIS REQUIRED)    Answer:   neck pain with paresthesia to left shoulder    Order Specific Question:   Is patient pregnant?    Answer:   No  Order Specific Question:   Preferred imaging location?    Answer:   [facility]    Meds ordered this encounter  Medications  . buPROPion (WELLBUTRIN XL) 150 MG 24 hr tablet    Sig: Take 1 tablet (150 mg total) by mouth daily.    Dispense:  90 tablet    Refill:  3    Marikay AlarEric Fardowsa Authier, MD [facility] [place] [facility]

## 2015-11-30 NOTE — Assessment & Plan Note (Signed)
Patient with neck pain and what appear to be paresthesias to her left shoulder over the last month. No neurological deficits in her upper extremities at this time. No cardiac symptoms. We'll check a cervical spine x-ray with follow-up consideration of physical therapy versus MRI. Given return precautions.

## 2015-11-30 NOTE — Assessment & Plan Note (Signed)
Advised on dietary changes. Difficult to exercise at this time given her orthopedic issues. We'll continue to work on this.

## 2015-11-30 NOTE — Assessment & Plan Note (Signed)
Continues to have issues with this. Notes the Klonopin was beneficial though has been out for several days as her insurance would not cover it at [facility]. She notes her insurance is going to discuss with [facility] to get this covered until it comes from her mail order. I will refill her Wellbutrin as well.

## 2015-11-30 NOTE — Patient Instructions (Signed)
Nice to meet you. We are going to get an x-ray of your neck to evaluate your symptoms further. I have refilled your Wellbutrin. Please continue to work on diet with decreasing soda intake and decreasing carb intake. You develop numbness, weakness, increased pain, chest pain, shortness of breath, or any new or changes in symptoms please seek medical attention.

## 2015-12-01 ENCOUNTER — Telehealth: Payer: Self-pay | Admitting: Family Medicine

## 2015-12-01 NOTE — Telephone Encounter (Signed)
Pt given results from imaging report. Pt told provider may clarify a little better when he results it.

## 2015-12-01 NOTE — Telephone Encounter (Signed)
Pt called to check the status of her imaging results from yesterday. Thank you!

## 2016-01-06 ENCOUNTER — Encounter (HOSPITAL_COMMUNITY): Payer: Self-pay | Admitting: Physician Assistant

## 2016-01-06 DIAGNOSIS — M1711 Unilateral primary osteoarthritis, right knee: Secondary | ICD-10-CM | POA: Diagnosis present

## 2016-01-06 HISTORY — DX: Unilateral primary osteoarthritis, right knee: M17.11

## 2016-01-06 NOTE — H&P (Signed)
TOTAL KNEE ADMISSION H&P  Patient is being admitted for right total knee arthroplasty.  Subjective:  Chief Complaint:right knee pain.  HPI: Anne James, 45 y.o. female, has a history of pain and functional disability in the right knee due to arthritis and has failed non-surgical conservative treatments for greater than 12 weeks to includeNSAID's and/or analgesics, corticosteriod injections, viscosupplementation injections, flexibility and strengthening excercises, supervised PT with diminished ADL's post treatment, use of assistive devices and activity modification.  Onset of symptoms was gradual, starting 10 years ago with gradually worsening course since that time. The patient noted no past surgery on the right knee(s).  Patient currently rates pain in the right knee(s) at 10 out of 10 with activity. Patient has night pain, worsening of pain with activity and weight bearing, pain that interferes with activities of daily living, crepitus and joint swelling.  Patient has evidence of subchondral sclerosis, periarticular osteophytes and joint space narrowing by imaging studies.  There is no active infection.  Patient Active Problem List   Diagnosis Date Noted  . Primary localized osteoarthritis of right knee 01/06/2016  . Neck pain 11/30/2015  . 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 Primary localized osteoarthritis of left knee   . Preop examination 06/08/2015  . Anxiety state 06/08/2015  . Chronic fatigue 07/02/2014  . Routine general medical examination at a health care facility 10/31/2012  . Right hip pain 02/23/2012  . Osteoarthritis of both knees 10/17/2011  . Depression 10/17/2011  . Screening for breast cancer 10/17/2011  . Obesity (BMI 30-39.9) 10/17/2011  . History of pulmonary embolus (PE) 03/14/2000   Past Medical History:  Diagnosis Date  . Anxiety   . 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 Chicken pox   . Depression   . Eating disorder   . History of pulmonary  embolus (PE) 2002  . Phlebitis   . Primary localized osteoarthritis of left knee   . Primary localized osteoarthritis of right knee 01/06/2016  . 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 GASTRIC BYPASS  2002   [facility], Dr. Kennedy Bucker  . NOVASURE ABLATION    . TOTAL KNEE ARTHROPLASTY Left 06/29/2015   Procedure: LEFT TOTAL KNEE ARTHROPLASTY;  Surgeon: Salvatore Marvel, MD;  Location: [facility] OR;  Service: Orthopedics;  Laterality: Left;  . TUBAL LIGATION      No current facility-administered medications for this encounter.   Current Outpatient Prescriptions:  .  buPROPion (WELLBUTRIN XL) 150 MG 24 hr tablet, Take 1 tablet (150 mg total) by mouth daily., Disp: 90 tablet, Rfl: 3 .  celecoxib (CELEBREX) 200 MG capsule, Take 1 capsule (200 mg total) by mouth daily., Disp: 90 capsule, Rfl: 3 .  Cholecalciferol (VITAMIN D-3) 5000 UNITS TABS, Take 1 tablet by mouth daily., Disp: , Rfl:  .  clonazePAM (KLONOPIN) 0.5 MG tablet, Take 1 tablet (0.5 mg total) by mouth 2 (two) times daily as needed for anxiety., Disp: 60 tablet, Rfl: 0 .  cyanocobalamin 500 MCG tablet, Take 500 mcg by mouth daily., Disp: , Rfl:  .  FLUoxetine (PROZAC) 40 MG capsule, Take 1 capsule (40 mg total) by mouth daily., Disp: 30 capsule, Rfl: 3 .  HYDROcodone-acetaminophen (NORCO/VICODIN) 5-325 MG tablet, Take 1 tablet by mouth every 8 (eight) hours as needed for pain., Disp: , Rfl:  .  Multiple Vitamin (MULTIVITAMIN) tablet, Take 1 tablet  by mouth daily., Disp: , Rfl:  .  ranitidine (ZANTAC) 150 MG tablet, Take 150 mg by mouth daily as needed for heartburn., Disp: , Rfl:  .  celecoxib (CELEBREX) 200 MG capsule, Take 1 capsule by mouth  daily (Patient not taking: Reported on 01/05/2016), Disp: 90 capsule, Rfl: 1    No Known Allergies    Social History  Substance Use Topics  . Smoking status: Never Smoker  .  Smokeless tobacco: Not on file  . Alcohol use 1.2 oz/week    2 Shots of liquor per week    Family History  Problem Relation Age of Onset  . Alcohol abuse Mother   . Drug abuse Mother   . Arthritis Maternal Grandmother   . Heart disease Maternal Grandmother   . Stroke Maternal Grandmother   . Hypertension Maternal Grandmother   . Kidney disease Maternal Grandmother   . Diabetes Maternal Grandmother   . Anxiety disorder Brother      Review of Systems  Constitutional: Negative.   HENT: Negative.   Eyes: Negative.   Respiratory: Negative.   Cardiovascular: Negative.   Gastrointestinal: Negative.   Genitourinary: Negative.   Musculoskeletal: Positive for back pain and joint pain.  Skin: Negative.   Neurological: Negative.   Endo/Heme/Allergies: Negative.   Psychiatric/Behavioral: Negative.     Objective:  Physical Exam  Constitutional: She is oriented to person, place, and time. She appears well-developed and well-nourished.  HENT:  Head: Normocephalic and atraumatic.  Mouth/Throat: Oropharynx is clear and moist.  Cardiovascular: Normal rate and regular rhythm.   Respiratory: Effort normal and breath sounds normal.  GI: Soft. Bowel sounds are normal.  Genitourinary:  Genitourinary Comments: Not pertinent to current symptomatology therefore not examined.  Musculoskeletal:  Examination of her right knee reveals pain medial and lateral. There is 1-2+ crepitation and 1+ synovitis. Her range of motion is 0-120 degrees with mild valgus deformity.  The knee is stable to ligamentous exam with normal patellar tracking. Examination of her left knee reveals a well healed total knee incision without swelling or pain. Full range of motion. The knee is stable with normal patella tracking. Vascular exam: pulses 2+ and symmetric. Neurologic exam: distal motor and sensory examination is within normal limits.   Neurological: She is alert and oriented to person, place, and time.  Skin: Skin is  warm and dry.  Psychiatric: She has a normal mood and affect. Her behavior is normal.    Vital signs in last 24 hours: Temp:  [97.8 F (36.6 C)] 97.8 F (36.6 C) (10/25 1500) Pulse Rate:  [67] 67 (10/25 1500) BP: (127)/(76) 127/76 (10/25 1500) SpO2:  [99 %] 99 % (10/25 1500) Weight:  [85.7 kg (189 lb)] 85.7 kg (189 lb) (10/25 1500)  Labs:   Estimated body mass index is 30.51 kg/m as calculated from the following:   Height as of this encounter: 5\' 6"  (1.676 m).   Weight as of this encounter: 85.7 kg (189 lb).   Imaging Review Plain radiographs demonstrate severe degenerative joint disease of the right knee(s). The overall alignment issignificant valgus. The bone quality appears to be good for age and reported activity level.  Assessment/Plan:  End stage arthritis, right knee  Principal Problem:   Primary localized osteoarthritis of right knee Active Problems:   Obesity (BMI 30-39.9)   Anxiety state   S/P total knee arthroplasty, left   History of pulmonary embolus (PE)  The patient history, physical examination, clinical judgment of the provider and imaging studies  are consistent with end stage degenerative joint disease of the right knee(s) and total knee arthroplasty is deemed medically necessary. The treatment options including medical management, injection therapy arthroscopy and arthroplasty were discussed at length. The risks and benefits of total knee arthroplasty were presented and reviewed. The risks due to aseptic loosening, infection, stiffness, patella tracking problems, thromboembolic complications and other imponderables were discussed. The patient acknowledged the explanation, agreed to proceed with the plan and consent was signed. Patient is being admitted for inpatient treatment for surgery, pain control, PT, OT, prophylactic antibiotics, VTE prophylaxis, progressive ambulation and ADL's and discharge planning. The patient is planning to be discharged home with  home health services

## 2016-01-07 ENCOUNTER — Other Ambulatory Visit (HOSPITAL_COMMUNITY): Payer: Self-pay | Admitting: *Deleted

## 2016-01-07 ENCOUNTER — Encounter (HOSPITAL_COMMUNITY): Payer: Self-pay

## 2016-01-07 ENCOUNTER — Encounter (HOSPITAL_COMMUNITY)
Admission: RE | Admit: 2016-01-07 | Discharge: 2016-01-07 | Disposition: A | Discharge status: Home / Self Care | Payer: Managed Care, Other (non HMO) | Source: Ambulatory Visit | Attending: Orthopedic Surgery | Admitting: Orthopedic Surgery

## 2016-01-07 DIAGNOSIS — M1711 Unilateral primary osteoarthritis, right knee: Secondary | ICD-10-CM | POA: Diagnosis not present

## 2016-01-07 DIAGNOSIS — Z01812 Encounter for preprocedural laboratory examination: Secondary | ICD-10-CM | POA: Diagnosis present

## 2016-01-07 DIAGNOSIS — Z0183 Encounter for blood typing: Secondary | ICD-10-CM | POA: Insufficient documentation

## 2016-01-07 HISTORY — DX: Gastro-esophageal reflux disease without esophagitis: K21.9

## 2016-01-07 HISTORY — DX: Anemia, unspecified: D64.9

## 2016-01-07 LAB — CBC WITH DIFFERENTIAL/PLATELET
BASOS ABS: 0 10*3/uL (ref 0.0–0.1)
BASOS PCT: 0 %
Eosinophils Absolute: 0.3 10*3/uL (ref 0.0–0.7)
Eosinophils Relative: 5 %
HEMATOCRIT: 35.1 % — AB (ref 36.0–46.0)
HEMOGLOBIN: 11 g/dL — AB (ref 12.0–15.0)
LYMPHS PCT: 30 %
Lymphs Abs: 1.7 10*3/uL (ref 0.7–4.0)
MCH: 25.1 pg — ABNORMAL LOW (ref 26.0–34.0)
MCHC: 31.3 g/dL (ref 30.0–36.0)
MCV: 80 fL (ref 78.0–100.0)
Monocytes Absolute: 0.5 10*3/uL (ref 0.1–1.0)
Monocytes Relative: 9 %
NEUTROS ABS: 3.3 10*3/uL (ref 1.7–7.7)
NEUTROS PCT: 56 %
Platelets: 384 10*3/uL (ref 150–400)
RBC: 4.39 MIL/uL (ref 3.87–5.11)
RDW: 16.2 % — ABNORMAL HIGH (ref 11.5–15.5)
WBC: 5.9 10*3/uL (ref 4.0–10.5)

## 2016-01-07 LAB — PROTIME-INR
INR: 0.94
Prothrombin Time: 12.6 seconds (ref 11.4–15.2)

## 2016-01-07 LAB — APTT: APTT: 27 s (ref 24–36)

## 2016-01-07 LAB — COMPREHENSIVE METABOLIC PANEL
ALBUMIN: 3.7 g/dL (ref 3.5–5.0)
ALK PHOS: 50 U/L (ref 38–126)
ALT: 17 U/L (ref 14–54)
AST: 28 U/L (ref 15–41)
Anion gap: 9 (ref 5–15)
BILIRUBIN TOTAL: 0.3 mg/dL (ref 0.3–1.2)
BUN: 16 mg/dL (ref 6–20)
CALCIUM: 8.7 mg/dL — AB (ref 8.9–10.3)
CO2: 22 mmol/L (ref 22–32)
CREATININE: 0.73 mg/dL (ref 0.44–1.00)
Chloride: 106 mmol/L (ref 101–111)
GFR calc Af Amer: >60 mL/min (ref >60)
GFR calc non Af Amer: >60 mL/min (ref >60)
GLUCOSE: 84 mg/dL (ref 65–99)
Potassium: 4 mmol/L (ref 3.5–5.1)
Sodium: 137 mmol/L (ref 135–145)
TOTAL PROTEIN: 6.1 g/dL — AB (ref 6.5–8.1)

## 2016-01-07 LAB — TYPE AND SCREEN
ABO/RH(D): A POS
ANTIBODY SCREEN: NEGATIVE

## 2016-01-07 LAB — SURGICAL PCR SCREEN
MRSA, PCR: NEGATIVE
STAPHYLOCOCCUS AUREUS: NEGATIVE

## 2016-01-07 NOTE — Progress Notes (Signed)
Pt denies cardiac history, chest pain or sob. Had left knee replaced in April, 2017 and states she did great with that surgery.

## 2016-01-07 NOTE — Pre-Procedure Instructions (Signed)
Anne James  01/07/2016     Your procedure is scheduled on Monday, January 18, 2016 at 9:45 AM.   Report to [facility] Entrance "A" Admitting Office at 7:45 AM.   Call this number if you have problems the morning of surgery: [phone]   Questions prior to day of surgery, please call [phone] between 8 & 4 PM.   Remember:  Do not eat food or drink liquids after midnight Sunday, 01/17/16.  Take these medicines the morning of surgery with A SIP OF WATER: Bupropion (Wellbutrin), Fluoxetine (Prozac), Ranitidine (Zantac) - if needed, Clonazepam (Klonopin) - if needed, Hydrocodone - if needed.  Stop NSAIDS (Celebrex, Ibuprofen, Aleve, etc.) and Multivitamins 7 days prior to surgery. Do not use any Aspirin products 7 days prior to surgery.   Do not wear jewelry, make-up or nail polish.  Do not wear lotions, powders, or perfumes.  Do not shave 48 hours prior to surgery.    Do not bring valuables to the hospital.  [facility] is not responsible for any belongings or valuables.  Contacts, dentures or bridgework may not be worn into surgery.  Leave your suitcase in the car.  After surgery it may be brought to your room.  For patients admitted to the hospital, discharge time will be determined by your treatment team.  Special instructions:  [facility] - Preparing for Surgery  Before surgery, you can play an important role.  Because skin is not sterile, your skin needs to be as free of germs as possible.  You can reduce the number of germs on you skin by washing with CHG (chlorahexidine gluconate) soap before surgery.  CHG is an antiseptic cleaner which kills germs and bonds with the skin to continue killing germs even after washing.  Please DO NOT use if you have an allergy to CHG or antibacterial soaps.  If your skin becomes reddened/irritated stop using the CHG and inform your nurse when you arrive at Short Stay.  Do not shave (including legs and underarms) for at least  48 hours prior to the first CHG shower.  You may shave your face.  Please follow these instructions carefully:   1.  Shower with CHG Soap the night before surgery and the                    morning of Surgery.  2.  If you choose to wash your hair, wash your hair first as usual with your       normal shampoo.  3.  After you shampoo, rinse your hair and body thoroughly to remove the shampoo.  4.  Use CHG as you would any other liquid soap.  You can apply chg directly       to the skin and wash gently with scrungie or a clean washcloth.  5.  Apply the CHG Soap to your body ONLY FROM THE NECK DOWN.        Do not use on open wounds or open sores.  Avoid contact with your eyes, ears, mouth and genitals (private parts).  Wash genitals (private parts) with your normal soap.  6.  Wash thoroughly, paying special attention to the area where your surgery        will be performed.  7.  Thoroughly rinse your body with warm water from the neck down.  8.  DO NOT shower/wash with your normal soap after using and rinsing off       the  CHG Soap.  9.  Pat yourself dry with a clean towel.            10.  Wear clean pajamas.            11.  Place clean sheets on your bed the night of your first shower and do not        sleep with pets.  Day of Surgery  Do not apply any lotions the morning of surgery.  Please wear clean clothes to the hospital.   Please read over the following fact sheets that you were given.

## 2016-01-08 LAB — URINE CULTURE

## 2016-01-11 ENCOUNTER — Telehealth: Payer: Self-pay | Admitting: Family Medicine

## 2016-01-11 NOTE — Telephone Encounter (Signed)
Do you have this in your red folder

## 2016-01-11 NOTE — Telephone Encounter (Signed)
Dr. Sherene SiresWainer's office called and looking for pre op clearance for Right Total Knee Replacement. Was looking for the status. A fax was sent back in September. Pt is scheduled for 11/6. Please advise, thank you!  Call @ [phone] - [address]

## 2016-01-12 NOTE — Telephone Encounter (Signed)
Form faxed

## 2016-01-12 NOTE — Telephone Encounter (Signed)
Form completed. Patient is low risk for perioperative cardiac issues based on a Gupta score of 0.02%. She denied chest pain and shortness of breath at her last office visit. She is at risk for DVT postop given history of PE. She should be on anticoagulation postoperatively. Otherwise low risk for medical complications following surgery. Form will be faxed.

## 2016-01-17 MED ORDER — CEFAZOLIN SODIUM-DEXTROSE 2-4 GM/100ML-% IV SOLN
2.0000 g | INTRAVENOUS | Status: AC
  Administered 2016-01-18: 2 g via INTRAVENOUS
  Filled 2016-01-17: qty 100

## 2016-01-17 NOTE — Anesthesia Preprocedure Evaluation (Addendum)
Anesthesia Evaluation  Patient identified by MRN, date of birth, ID band Patient awake    Reviewed: Allergy & Precautions, H&P , NPO status , Patient's Chart, lab work & pertinent test results  Airway Mallampati: II  TM Distance: >3 FB Neck ROM: Full    Dental no notable dental hx. (+) Teeth Intact, Dental Advisory Given, Dental Advidsory Given   Pulmonary neg pulmonary ROS,    Pulmonary exam normal breath sounds clear to auscultation       Cardiovascular negative cardio ROS   Rhythm:Regular Rate:Normal     Neuro/Psych Anxiety Depression negative neurological ROS     GI/Hepatic Neg liver ROS, GERD  Medicated and Controlled,  Endo/Other  negative endocrine ROS  Renal/GU negative Renal ROS  negative genitourinary   Musculoskeletal  (+) Arthritis , Osteoarthritis,    Abdominal   Peds  Hematology negative hematology ROS (+) anemia ,   Anesthesia Other Findings   Reproductive/Obstetrics negative OB ROS                           Anesthesia Physical Anesthesia Plan  ASA: II  Anesthesia Plan: MAC and Spinal   Post-op Pain Management:  Regional for Post-op pain   Induction: Intravenous  Airway Management Planned: Simple Face Mask  Additional Equipment:   Intra-op Plan:   Post-operative Plan:   Informed Consent: I have reviewed the patients History and Physical, chart, labs and discussed the procedure including the risks, benefits and alternatives for the proposed anesthesia with the patient or authorized representative who has indicated his/her understanding and acceptance.   Dental advisory given  Plan Discussed with: CRNA  Anesthesia Plan Comments:         Anesthesia Quick Evaluation

## 2016-01-18 ENCOUNTER — Inpatient Hospital Stay (HOSPITAL_COMMUNITY): Payer: Managed Care, Other (non HMO) | Admitting: Anesthesiology

## 2016-01-18 ENCOUNTER — Encounter (HOSPITAL_COMMUNITY)
Admission: RE | Disposition: A | Discharge status: Home Health | Payer: Self-pay | Source: Ambulatory Visit | Attending: Orthopedic Surgery

## 2016-01-18 ENCOUNTER — Inpatient Hospital Stay (HOSPITAL_COMMUNITY)
Admission: RE | Admit: 2016-01-18 | Discharge: 2016-01-20 | DRG: 470 | Disposition: A | Discharge status: Home Health | Payer: Managed Care, Other (non HMO) | Source: Ambulatory Visit | Attending: Orthopedic Surgery | Admitting: Orthopedic Surgery

## 2016-01-18 DIAGNOSIS — Z833 Family history of diabetes mellitus: Secondary | ICD-10-CM | POA: Diagnosis not present

## 2016-01-18 DIAGNOSIS — Z9884 Bariatric surgery status: Secondary | ICD-10-CM

## 2016-01-18 DIAGNOSIS — Z23 Encounter for immunization: Secondary | ICD-10-CM | POA: Diagnosis not present

## 2016-01-18 DIAGNOSIS — Z96652 Presence of left artificial knee joint: Secondary | ICD-10-CM | POA: Diagnosis present

## 2016-01-18 DIAGNOSIS — Z818 Family history of other mental and behavioral disorders: Secondary | ICD-10-CM

## 2016-01-18 DIAGNOSIS — E669 Obesity, unspecified: Secondary | ICD-10-CM | POA: Diagnosis present

## 2016-01-18 DIAGNOSIS — Z8249 Family history of ischemic heart disease and other diseases of the circulatory system: Secondary | ICD-10-CM

## 2016-01-18 DIAGNOSIS — F419 Anxiety disorder, unspecified: Secondary | ICD-10-CM | POA: Diagnosis present

## 2016-01-18 DIAGNOSIS — Z823 Family history of stroke: Secondary | ICD-10-CM

## 2016-01-18 DIAGNOSIS — Z86711 Personal history of pulmonary embolism: Secondary | ICD-10-CM

## 2016-01-18 DIAGNOSIS — Z791 Long term (current) use of non-steroidal anti-inflammatories (NSAID): Secondary | ICD-10-CM | POA: Diagnosis not present

## 2016-01-18 DIAGNOSIS — R5383 Other fatigue: Secondary | ICD-10-CM | POA: Diagnosis present

## 2016-01-18 DIAGNOSIS — Z811 Family history of alcohol abuse and dependence: Secondary | ICD-10-CM

## 2016-01-18 DIAGNOSIS — M1711 Unilateral primary osteoarthritis, right knee: Secondary | ICD-10-CM | POA: Diagnosis present

## 2016-01-18 DIAGNOSIS — F329 Major depressive disorder, single episode, unspecified: Secondary | ICD-10-CM | POA: Diagnosis present

## 2016-01-18 DIAGNOSIS — Z79891 Long term (current) use of opiate analgesic: Secondary | ICD-10-CM | POA: Diagnosis not present

## 2016-01-18 DIAGNOSIS — Z79899 Other long term (current) drug therapy: Secondary | ICD-10-CM | POA: Diagnosis not present

## 2016-01-18 DIAGNOSIS — Z6831 Body mass index (BMI) 31.0-31.9, adult: Secondary | ICD-10-CM

## 2016-01-18 DIAGNOSIS — D509 Iron deficiency anemia, unspecified: Secondary | ICD-10-CM | POA: Diagnosis present

## 2016-01-18 DIAGNOSIS — M25561 Pain in right knee: Secondary | ICD-10-CM | POA: Diagnosis present

## 2016-01-18 DIAGNOSIS — F411 Generalized anxiety disorder: Secondary | ICD-10-CM | POA: Diagnosis present

## 2016-01-18 DIAGNOSIS — F32A Depression, unspecified: Secondary | ICD-10-CM | POA: Diagnosis present

## 2016-01-18 HISTORY — PX: TOTAL KNEE ARTHROPLASTY: SHX125

## 2016-01-18 HISTORY — DX: Presence of left artificial knee joint: Z96.652

## 2016-01-18 HISTORY — DX: Unilateral primary osteoarthritis, right knee: M17.11

## 2016-01-18 HISTORY — DX: Personal history of pulmonary embolism: Z86.711

## 2016-01-18 LAB — CBC
HCT: 35.9 % — ABNORMAL LOW (ref 36.0–46.0)
Hemoglobin: 10.8 g/dL — ABNORMAL LOW (ref 12.0–15.0)
MCH: 24.5 pg — AB (ref 26.0–34.0)
MCHC: 30.1 g/dL (ref 30.0–36.0)
MCV: 81.6 fL (ref 78.0–100.0)
PLATELETS: 373 10*3/uL (ref 150–400)
RBC: 4.4 MIL/uL (ref 3.87–5.11)
RDW: 16.3 % — AB (ref 11.5–15.5)
WBC: 12 10*3/uL — ABNORMAL HIGH (ref 4.0–10.5)

## 2016-01-18 LAB — CREATININE, SERUM
Creatinine, Ser: 0.56 mg/dL (ref 0.44–1.00)
GFR calc Af Amer: >60 mL/min (ref >60)
GFR calc non Af Amer: >60 mL/min (ref >60)

## 2016-01-18 SURGERY — ARTHROPLASTY, KNEE, TOTAL
Anesthesia: Monitor Anesthesia Care | Site: Knee | Laterality: Right

## 2016-01-18 MED ORDER — FLUOXETINE HCL 20 MG PO CAPS
40.0000 mg | ORAL_CAPSULE | Freq: Every day | ORAL | Status: DC
  Administered 2016-01-19 – 2016-01-20 (×2): 40 mg via ORAL
  Filled 2016-01-18 (×3): qty 2

## 2016-01-18 MED ORDER — DOCUSATE SODIUM 100 MG PO CAPS
100.0000 mg | ORAL_CAPSULE | Freq: Two times a day (BID) | ORAL | Status: DC
  Administered 2016-01-18 – 2016-01-20 (×4): 100 mg via ORAL
  Filled 2016-01-18 (×4): qty 1

## 2016-01-18 MED ORDER — ACETAMINOPHEN 325 MG PO TABS
650.0000 mg | ORAL_TABLET | Freq: Four times a day (QID) | ORAL | Status: DC | PRN
  Administered 2016-01-20: 650 mg via ORAL
  Filled 2016-01-18: qty 2

## 2016-01-18 MED ORDER — OXYCODONE HCL ER 20 MG PO T12A
20.0000 mg | EXTENDED_RELEASE_TABLET | Freq: Two times a day (BID) | ORAL | Status: DC
  Administered 2016-01-18 – 2016-01-20 (×5): 20 mg via ORAL
  Filled 2016-01-18 (×5): qty 1

## 2016-01-18 MED ORDER — METOCLOPRAMIDE HCL 5 MG/ML IJ SOLN: 5.0000 mg | Freq: Three times a day (TID) | INTRAMUSCULAR | Status: DC | PRN

## 2016-01-18 MED ORDER — MIDAZOLAM HCL 2 MG/2ML IJ SOLN
INTRAMUSCULAR | Status: AC
  Filled 2016-01-18: qty 2

## 2016-01-18 MED ORDER — GABAPENTIN 300 MG PO CAPS
ORAL_CAPSULE | ORAL | Status: AC
  Administered 2016-01-18: 600 mg
  Filled 2016-01-18: qty 2

## 2016-01-18 MED ORDER — LIDOCAINE 2% (20 MG/ML) 5 ML SYRINGE
INTRAMUSCULAR | Status: AC
  Filled 2016-01-18: qty 5

## 2016-01-18 MED ORDER — PROPOFOL 10 MG/ML IV BOLUS
INTRAVENOUS | Status: AC
  Filled 2016-01-18: qty 20

## 2016-01-18 MED ORDER — HYDROMORPHONE HCL 2 MG/ML IJ SOLN
1.0000 mg | INTRAMUSCULAR | Status: DC | PRN
  Administered 2016-01-18 – 2016-01-19 (×4): 1 mg via INTRAVENOUS
  Filled 2016-01-18 (×4): qty 1

## 2016-01-18 MED ORDER — FENTANYL CITRATE (PF) 100 MCG/2ML IJ SOLN
INTRAMUSCULAR | Status: DC | PRN
  Administered 2016-01-18: 100 ug via INTRAVENOUS

## 2016-01-18 MED ORDER — 0.9 % SODIUM CHLORIDE (POUR BTL) OPTIME
TOPICAL | Status: DC | PRN
  Administered 2016-01-18: 1000 mL

## 2016-01-18 MED ORDER — PHENOL 1.4 % MT LIQD: 1.0000 | OROMUCOSAL | Status: DC | PRN

## 2016-01-18 MED ORDER — LIDOCAINE HCL (CARDIAC) 20 MG/ML IV SOLN
INTRAVENOUS | Status: DC | PRN
  Administered 2016-01-18: 50 mg via INTRAVENOUS

## 2016-01-18 MED ORDER — ROPIVACAINE HCL 7.5 MG/ML IJ SOLN
INTRAMUSCULAR | Status: DC | PRN
  Administered 2016-01-18: 20 mL via PERINEURAL

## 2016-01-18 MED ORDER — BUPROPION HCL ER (XL) 150 MG PO TB24
150.0000 mg | ORAL_TABLET | Freq: Every day | ORAL | Status: DC
  Administered 2016-01-18 – 2016-01-20 (×3): 150 mg via ORAL
  Filled 2016-01-18 (×4): qty 1

## 2016-01-18 MED ORDER — ACETAMINOPHEN 500 MG PO TABS
ORAL_TABLET | ORAL | Status: AC
  Administered 2016-01-18: 1000 mg
  Filled 2016-01-18: qty 2

## 2016-01-18 MED ORDER — BUPIVACAINE HCL (PF) 0.25 % IJ SOLN
INTRAMUSCULAR | Status: AC
  Filled 2016-01-18: qty 30

## 2016-01-18 MED ORDER — VITAMIN D 1000 UNITS PO TABS
5000.0000 [IU] | ORAL_TABLET | Freq: Every day | ORAL | Status: DC
  Administered 2016-01-19 – 2016-01-20 (×2): 5000 [IU] via ORAL
  Filled 2016-01-18 (×4): qty 5

## 2016-01-18 MED ORDER — HYDROMORPHONE HCL 1 MG/ML IJ SOLN: 0.2500 mg | INTRAMUSCULAR | Status: DC | PRN

## 2016-01-18 MED ORDER — FENTANYL CITRATE (PF) 100 MCG/2ML IJ SOLN
INTRAMUSCULAR | Status: AC
  Filled 2016-01-18: qty 2

## 2016-01-18 MED ORDER — EPINEPHRINE PF 1 MG/ML IJ SOLN
INTRAMUSCULAR | Status: AC
  Filled 2016-01-18: qty 1

## 2016-01-18 MED ORDER — ALUM & MAG HYDROXIDE-SIMETH 200-200-20 MG/5ML PO SUSP: 30.0000 mL | ORAL | Status: DC | PRN

## 2016-01-18 MED ORDER — PROPOFOL 500 MG/50ML IV EMUL
INTRAVENOUS | Status: DC | PRN
  Administered 2016-01-18: 10:00:00 via INTRAVENOUS
  Administered 2016-01-18: 75 ug/kg/min via INTRAVENOUS

## 2016-01-18 MED ORDER — OXYCODONE HCL 5 MG PO TABS
5.0000 mg | ORAL_TABLET | ORAL | Status: DC | PRN
  Administered 2016-01-18: 5 mg via ORAL
  Administered 2016-01-18: 10 mg via ORAL
  Administered 2016-01-19: 5 mg via ORAL
  Administered 2016-01-19 (×2): 10 mg via ORAL
  Administered 2016-01-19: 5 mg via ORAL
  Administered 2016-01-19 – 2016-01-20 (×5): 10 mg via ORAL
  Filled 2016-01-18 (×8): qty 2
  Filled 2016-01-18: qty 1
  Filled 2016-01-18: qty 2

## 2016-01-18 MED ORDER — MIDAZOLAM HCL 5 MG/5ML IJ SOLN
INTRAMUSCULAR | Status: DC | PRN
  Administered 2016-01-18: 2 mg via INTRAVENOUS

## 2016-01-18 MED ORDER — METOCLOPRAMIDE HCL 5 MG PO TABS: 5.0000 mg | ORAL_TABLET | Freq: Three times a day (TID) | ORAL | Status: DC | PRN

## 2016-01-18 MED ORDER — CELECOXIB 200 MG PO CAPS
200.0000 mg | ORAL_CAPSULE | Freq: Two times a day (BID) | ORAL | Status: DC
  Administered 2016-01-18 – 2016-01-20 (×5): 200 mg via ORAL
  Filled 2016-01-18 (×5): qty 1

## 2016-01-18 MED ORDER — DIPHENHYDRAMINE HCL 12.5 MG/5ML PO ELIX: 12.5000 mg | ORAL_SOLUTION | ORAL | Status: DC | PRN

## 2016-01-18 MED ORDER — MENTHOL 3 MG MT LOZG: 1.0000 | LOZENGE | OROMUCOSAL | Status: DC | PRN

## 2016-01-18 MED ORDER — SODIUM CHLORIDE 0.9 % IR SOLN
Status: DC | PRN
Start: 2016-01-18 — End: 2016-01-18
  Administered 2016-01-18: 3000 mL

## 2016-01-18 MED ORDER — LACTATED RINGERS IV SOLN
INTRAVENOUS | Status: DC
  Administered 2016-01-18: 09:00:00 via INTRAVENOUS

## 2016-01-18 MED ORDER — POLYETHYLENE GLYCOL 3350 17 G PO PACK
17.0000 g | PACK | Freq: Two times a day (BID) | ORAL | Status: DC
  Administered 2016-01-19 – 2016-01-20 (×2): 17 g via ORAL
  Filled 2016-01-18 (×3): qty 1

## 2016-01-18 MED ORDER — DEXAMETHASONE SODIUM PHOSPHATE 10 MG/ML IJ SOLN
10.0000 mg | Freq: Three times a day (TID) | INTRAMUSCULAR | Status: AC
  Administered 2016-01-18 – 2016-01-19 (×4): 10 mg via INTRAVENOUS
  Filled 2016-01-18 (×4): qty 1

## 2016-01-18 MED ORDER — ENOXAPARIN SODIUM 30 MG/0.3ML ~~LOC~~ SOLN
30.0000 mg | Freq: Two times a day (BID) | SUBCUTANEOUS | Status: DC
  Administered 2016-01-19 – 2016-01-20 (×3): 30 mg via SUBCUTANEOUS
  Filled 2016-01-18 (×3): qty 0.3

## 2016-01-18 MED ORDER — BUPIVACAINE HCL (PF) 0.25 % IJ SOLN
INTRAMUSCULAR | Status: DC | PRN
  Administered 2016-01-18: 30 mL

## 2016-01-18 MED ORDER — ONDANSETRON HCL 4 MG PO TABS: 4.0000 mg | ORAL_TABLET | Freq: Four times a day (QID) | ORAL | Status: DC | PRN

## 2016-01-18 MED ORDER — DEXAMETHASONE SODIUM PHOSPHATE 10 MG/ML IJ SOLN
INTRAMUSCULAR | Status: AC
  Filled 2016-01-18: qty 1

## 2016-01-18 MED ORDER — PHENYLEPHRINE HCL 10 MG/ML IJ SOLN
INTRAMUSCULAR | Status: DC | PRN
  Administered 2016-01-18: 40 ug via INTRAVENOUS
  Administered 2016-01-18: 80 ug via INTRAVENOUS
  Administered 2016-01-18: 40 ug via INTRAVENOUS

## 2016-01-18 MED ORDER — CEFAZOLIN SODIUM-DEXTROSE 2-4 GM/100ML-% IV SOLN
2.0000 g | Freq: Three times a day (TID) | INTRAVENOUS | Status: AC
Start: 2016-01-18 — End: 2016-01-19
  Administered 2016-01-18 – 2016-01-19 (×2): 2 g via INTRAVENOUS
  Filled 2016-01-18 (×3): qty 100

## 2016-01-18 MED ORDER — EPINEPHRINE PF 1 MG/ML IJ SOLN
INTRAMUSCULAR | Status: DC | PRN
  Administered 2016-01-18: .15 mL

## 2016-01-18 MED ORDER — DEXAMETHASONE SODIUM PHOSPHATE 10 MG/ML IJ SOLN
INTRAMUSCULAR | Status: DC | PRN
  Administered 2016-01-18: 10 mg via INTRAVENOUS

## 2016-01-18 MED ORDER — BUPIVACAINE-EPINEPHRINE 0.25% -1:200000 IJ SOLN: INTRAMUSCULAR | Status: DC | PRN

## 2016-01-18 MED ORDER — CELECOXIB 200 MG PO CAPS
ORAL_CAPSULE | ORAL | Status: AC
  Administered 2016-01-18: 200 mg
  Filled 2016-01-18: qty 1

## 2016-01-18 MED ORDER — CLONAZEPAM 0.5 MG PO TABS
0.5000 mg | ORAL_TABLET | Freq: Three times a day (TID) | ORAL | Status: DC | PRN
  Administered 2016-01-19: 0.5 mg via ORAL
  Filled 2016-01-18: qty 1

## 2016-01-18 MED ORDER — FAMOTIDINE 20 MG PO TABS
20.0000 mg | ORAL_TABLET | Freq: Two times a day (BID) | ORAL | Status: DC
  Administered 2016-01-18 – 2016-01-20 (×4): 20 mg via ORAL
  Filled 2016-01-18 (×4): qty 1

## 2016-01-18 MED ORDER — ACETAMINOPHEN 650 MG RE SUPP: 650.0000 mg | Freq: Four times a day (QID) | RECTAL | Status: DC | PRN

## 2016-01-18 MED ORDER — ONDANSETRON HCL 4 MG/2ML IJ SOLN
INTRAMUSCULAR | Status: AC
  Filled 2016-01-18: qty 2

## 2016-01-18 MED ORDER — ONDANSETRON HCL 4 MG/2ML IJ SOLN: 4.0000 mg | Freq: Four times a day (QID) | INTRAMUSCULAR | Status: DC | PRN

## 2016-01-18 MED ORDER — POTASSIUM CHLORIDE IN NACL 20-0.9 MEQ/L-% IV SOLN
INTRAVENOUS | Status: DC
  Administered 2016-01-18 – 2016-01-19 (×2): via INTRAVENOUS
  Filled 2016-01-18 (×2): qty 1000

## 2016-01-18 MED ORDER — BUPIVACAINE IN DEXTROSE 0.75-8.25 % IT SOLN
INTRATHECAL | Status: DC | PRN
  Administered 2016-01-18: 15 mg via INTRATHECAL

## 2016-01-18 SURGICAL SUPPLY — 71 items
BANDAGE ESMARK 6X9 LF (GAUZE/BANDAGES/DRESSINGS) ×1 IMPLANT
BENZOIN TINCTURE PRP APPL 2/3 (GAUZE/BANDAGES/DRESSINGS) ×3 IMPLANT
BLADE SAGITTAL 25.0X1.19X90 (BLADE) ×2 IMPLANT
BLADE SAGITTAL 25.0X1.19X90MM (BLADE) ×1
BLADE SAW SGTL 13X75X1.27 (BLADE) ×3 IMPLANT
BLADE SURG 10 STRL SS (BLADE) ×6 IMPLANT
BNDG ELASTIC 6X15 VLCR STRL LF (GAUZE/BANDAGES/DRESSINGS) ×3 IMPLANT
BNDG ESMARK 6X9 LF (GAUZE/BANDAGES/DRESSINGS) ×3
BOWL SMART MIX CTS (DISPOSABLE) ×3 IMPLANT
CAPT KNEE TOTAL 3 ATTUNE ×3 IMPLANT
CEMENT HV SMART SET (Cement) ×6 IMPLANT
CLOSURE WOUND 1/2 X4 (GAUZE/BANDAGES/DRESSINGS) ×1
COVER SURGICAL LIGHT HANDLE (MISCELLANEOUS) ×3 IMPLANT
CUFF TOURNIQUET SINGLE 34IN LL (TOURNIQUET CUFF) ×3 IMPLANT
CUFF TOURNIQUET SINGLE 44IN (TOURNIQUET CUFF) IMPLANT
DECANTER SPIKE VIAL GLASS SM (MISCELLANEOUS) ×3 IMPLANT
DRAPE EXTREMITY T 121X128X90 (DRAPE) ×3 IMPLANT
DRAPE HALF SHEET 40X57 (DRAPES) ×3 IMPLANT
DRAPE INCISE IOBAN 66X45 STRL (DRAPES) ×3 IMPLANT
DRAPE PROXIMA HALF (DRAPES) ×3 IMPLANT
DRAPE U-SHAPE 47X51 STRL (DRAPES) ×3 IMPLANT
DRSG AQUACEL AG ADV 3.5X10 (GAUZE/BANDAGES/DRESSINGS) ×3 IMPLANT
DRSG AQUACEL AG ADV 3.5X14 (GAUZE/BANDAGES/DRESSINGS) ×3 IMPLANT
DURAPREP 26ML APPLICATOR (WOUND CARE) ×6 IMPLANT
ELECT CAUTERY BLADE 6.4 (BLADE) ×3 IMPLANT
ELECT REM PT RETURN 9FT ADLT (ELECTROSURGICAL) ×3
ELECTRODE REM PT RTRN 9FT ADLT (ELECTROSURGICAL) ×1 IMPLANT
FACESHIELD WRAPAROUND (MASK) ×3 IMPLANT
GLOVE BIO SURGEON STRL SZ7 (GLOVE) ×3 IMPLANT
GLOVE BIOGEL PI IND STRL 7.0 (GLOVE) ×1 IMPLANT
GLOVE BIOGEL PI IND STRL 7.5 (GLOVE) ×1 IMPLANT
GLOVE BIOGEL PI INDICATOR 7.0 (GLOVE) ×2
GLOVE BIOGEL PI INDICATOR 7.5 (GLOVE) ×2
GLOVE SS BIOGEL STRL SZ 7.5 (GLOVE) ×1 IMPLANT
GLOVE SUPERSENSE BIOGEL SZ 7.5 (GLOVE) ×2
GOWN STRL REUS W/ TWL LRG LVL3 (GOWN DISPOSABLE) ×1 IMPLANT
GOWN STRL REUS W/ TWL XL LVL3 (GOWN DISPOSABLE) ×2 IMPLANT
GOWN STRL REUS W/TWL LRG LVL3 (GOWN DISPOSABLE) ×2
GOWN STRL REUS W/TWL XL LVL3 (GOWN DISPOSABLE) ×4
HANDPIECE INTERPULSE COAX TIP (DISPOSABLE) ×2
HOOD PEEL AWAY FACE SHEILD DIS (HOOD) ×6 IMPLANT
IMMOBILIZER KNEE 20 (SOFTGOODS) ×3
IMMOBILIZER KNEE 20 THIGH 36 (SOFTGOODS) ×1 IMPLANT
IMMOBILIZER KNEE 22 UNIV (SOFTGOODS) ×3 IMPLANT
KIT BASIN OR (CUSTOM PROCEDURE TRAY) ×3 IMPLANT
KIT ROOM TURNOVER OR (KITS) ×3 IMPLANT
MANIFOLD NEPTUNE II (INSTRUMENTS) ×3 IMPLANT
MARKER SKIN DUAL TIP RULER LAB (MISCELLANEOUS) ×3 IMPLANT
NEEDLE 18GX1X1/2 (RX/OR ONLY) (NEEDLE) ×3 IMPLANT
NS IRRIG 1000ML POUR BTL (IV SOLUTION) ×3 IMPLANT
PACK TOTAL JOINT (CUSTOM PROCEDURE TRAY) ×3 IMPLANT
PAD ARMBOARD 7.5X6 YLW CONV (MISCELLANEOUS) ×6 IMPLANT
SET HNDPC FAN SPRY TIP SCT (DISPOSABLE) ×1 IMPLANT
STRIP CLOSURE SKIN 1/2X4 (GAUZE/BANDAGES/DRESSINGS) ×2 IMPLANT
SUCTION FRAZIER HANDLE 10FR (MISCELLANEOUS) ×2
SUCTION TUBE FRAZIER 10FR DISP (MISCELLANEOUS) ×1 IMPLANT
SUT MNCRL AB 3-0 PS2 18 (SUTURE) ×3 IMPLANT
SUT VIC AB 0 CT1 27 (SUTURE) ×4
SUT VIC AB 0 CT1 27XBRD ANBCTR (SUTURE) ×2 IMPLANT
SUT VIC AB 1 CT1 27 (SUTURE) ×2
SUT VIC AB 1 CT1 27XBRD ANBCTR (SUTURE) ×1 IMPLANT
SUT VIC AB 2-0 CT1 27 (SUTURE) ×4
SUT VIC AB 2-0 CT1 TAPERPNT 27 (SUTURE) ×2 IMPLANT
SYR 30ML LL (SYRINGE) ×3 IMPLANT
TOWEL OR 17X24 6PK STRL BLUE (TOWEL DISPOSABLE) ×3 IMPLANT
TOWEL OR 17X26 10 PK STRL BLUE (TOWEL DISPOSABLE) ×3 IMPLANT
TRAY CATH 16FR W/PLASTIC CATH (SET/KITS/TRAYS/PACK) IMPLANT
TRAY FOLEY CATH 16FR SILVER (SET/KITS/TRAYS/PACK) ×3 IMPLANT
TUBE CONNECTING 12'X1/4 (SUCTIONS) ×1
TUBE CONNECTING 12X1/4 (SUCTIONS) ×2 IMPLANT
YANKAUER SUCT BULB TIP NO VENT (SUCTIONS) ×3 IMPLANT

## 2016-01-18 NOTE — Anesthesia Postprocedure Evaluation (Signed)
Anesthesia Post Note  Patient: Anne FlavorsLori D Heppler  Procedure(s) Performed: Procedure(s) (LRB): TOTAL KNEE ARTHROPLASTY (Right)  Patient location during evaluation: PACU Anesthesia Type: Spinal, MAC and Regional Level of consciousness: awake and alert Pain management: pain level controlled Vital Signs Assessment: post-procedure vital signs reviewed and stable Respiratory status: spontaneous breathing and respiratory function stable Cardiovascular status: blood pressure returned to baseline and stable Postop Assessment: spinal receding Anesthetic complications: no    Last Vitals:  Vitals:   01/18/16 0819 01/18/16 1145  BP: 123/65   Pulse: (!) 58   Resp: 20   Temp: 36.8 C 36.4 C    Last Pain:  Vitals:   01/18/16 1215  TempSrc:   PainSc: 0-No pain                 Faun Mcqueen,W. EDMOND

## 2016-01-18 NOTE — Interval H&P Note (Signed)
History and Physical Interval Note:  01/18/2016 9:10 AM  Anne FlavorsLori D Andrepont  has presented today for surgery, with the diagnosis of djd right knee  The various methods of treatment have been discussed with the patient and family. After consideration of risks, benefits and other options for treatment, the patient has consented to  Procedure(s): TOTAL KNEE ARTHROPLASTY (Right) as a surgical intervention .  The patient's history has been reviewed, patient examined, no change in status, stable for surgery.  I have reviewed the patient's chart and labs.  Questions were answered to the patient's satisfaction.     Salvatore MarvelWAINER,Mattisen Pohlmann A

## 2016-01-18 NOTE — Anesthesia Procedure Notes (Addendum)
Anesthesia Regional Block:  Adductor canal block  Pre-Anesthetic Checklist: ,, timeout performed, Correct Patient, Correct Site, Correct Laterality, Correct Procedure, Correct Position, site marked, Risks and benefits discussed, pre-op evaluation,  At surgeon's request and post-op pain management  Laterality: Right  Prep: Maximum Sterile Barrier Precautions used, chloraprep       Needles:  Injection technique: Single-shot  Needle Type: Echogenic Stimulator Needle     Needle Length: 9cm 9 cm Needle Gauge: 21 and 21 G    Additional Needles:  Procedures: ultrasound guided (picture in chart) Adductor canal block Narrative:  Start time: 01/18/2016 8:58 AM End time: 01/18/2016 9:08 AM Injection made incrementally with aspirations every 5 mL. Anesthesiologist: Gaynelle AduFITZGERALD, Prentice Sackrider  Additional Notes: 2% Lidocaine skin wheel.

## 2016-01-18 NOTE — Op Note (Signed)
MRN:     [id number] DOB/AGE:    [birth year] / 45 y.o.       OPERATIVE REPORT    DATE OF PROCEDURE:  01/18/2016       PREOPERATIVE DIAGNOSIS:   Primary localized OA right knee      Estimated body mass index is 31.45 kg/m as calculated from the following:   Height as of this encounter: 5\' 5"  (1.651 m).   Weight as of this encounter: 85.7 kg (189 lb).                                                        POSTOPERATIVE DIAGNOSIS:   same                                                                     PROCEDURE:  Procedure(s): TOTAL KNEE ARTHROPLASTY Using Depuy Attune RP implants #4 Femur, #4Tibia, 7mm  RP bearing, 29 Patella     SURGEON: Anne James    ASSISTANT:  Kirstin Shepperson PA-C   (Present and scrubbed throughout the case, critical for assistance with exposure, retraction, instrumentation, and closure.)         ANESTHESIA: Spinal with Adductor Nerve Block     TOURNIQUET TIME: 80min   COMPLICATIONS:  None     SPECIMENS: None   INDICATIONS FOR PROCEDURE: The patient has  djd right knee, varus deformities, XR shows bone on bone arthritis. Patient has failed all conservative measures including anti-inflammatory medicines, narcotics, attempts at  exercise and weight loss, cortisone injections and viscosupplementation.  Risks and benefits of surgery have been discussed, questions answered.   DESCRIPTION OF PROCEDURE: The patient identified by armband, received  right femoral nerve block and IV antibiotics, [facility] holding [facility]. Patient taken to the operating room, appropriate anesthetic  monitors were attached spinal anesthesia induced with  the patient in supine position, Foley catheter was inserted. Tourniquet  applied high to the operative thigh. Lateral post and foot positioner  applied to the table, the lower extremity was then prepped and draped  in usual sterile fashion from the ankle to the tourniquet. Time-out procedure was performed. The  limb was wrapped with an Esmarch bandage and the tourniquet inflated to 365 mmHg. We began the operation by making the anterior midline incision starting at handbreadth above the patella going over the patella 1 cm medial to and  4 cm distal to the tibial tubercle. Small bleeders in the skin and the  subcutaneous tissue identified and cauterized. Transverse retinaculum was incised and reflected medially and James medial parapatellar arthrotomy was accomplished. the patella was everted and theprepatellar fat pad resected. The superficial medial collateral  ligament was then elevated from anterior to posterior along the proximal  flare of the tibia and anterior half of the menisci resected. The knee was hyperflexed exposing bone on bone arthritis. Peripheral and notch osteophytes as well as the cruciate ligaments were then resected. We continued to  work our way around posteriorly along the proximal tibia, and externally  rotated the tibia subluxing it out from  underneath the femur. James McHale  retractor was placed through the notch and James lateral Hohmann retractor  placed, and we then drilled through the proximal tibia in line with the  axis of the tibia followed by an intramedullary guide rod and 2-degree  posterior slope cutting guide. The tibial cutting guide was pinned into place  allowing resection of 6 mm of bone medially and about 4 mm of bone  laterally because of her valgus deformity. Satisfied with the tibial resection, we then  entered the distal femur 2 mm anterior to the PCL origin with the  intramedullary guide rod and applied the distal femoral cutting guide  set at 11mm, with 5 degrees of valgus. This was pinned along the  epicondylar axis. At this point, the distal femoral cut was accomplished without difficulty. We then sized for James #4 femoral component and pinned the guide in 3 degrees of external rotation.The chamfer cutting guide was pinned into place. The anterior, posterior, and chamfer  cuts were accomplished without difficulty followed by  the  RP box cutting guide and the box cut. We also removed posterior osteophytes from the posterior femoral condyles. At this  time, the knee was brought into full extension. We checked our  extension and flexion gaps and found them symmetric at 7mm.  The patella thickness measured at 18 mm. We set the cutting guide at 12 and removed the posterior 6 mm  of the patella sized for 29 button and drilled the lollipop. The knee  was then once again hyperflexed exposing the proximal tibia. We sized for James #4 tibial base plate, applied the smokestack and the conical reamer followed by the the Delta fin keel punch. We then hammered into place the  RP trial femoral component, inserted James 1 trial bearing, trial patellar button, and took the knee through range of motion from 0-130 degrees. No thumb pressure was required for patellar  tracking. At this point, all trial components were removed, James double batch of DePuy HV cement  was mixed and applied to all bony metallic mating surfaces except for the posterior condyles of the femur itself. In order, we  hammered into place the tibial tray and removed excess cement, the femoral component and removed excess cement, James 7mm  RP bearing  was inserted, and the knee brought to full extension with compression.  The patellar button was clamped into place, and excess cement  removed. While the cement cured the wound was irrigated out with normal saline solution pulse lavage.. Ligament stability and patellar tracking were checked and found to be excellent.. The parapatellar arthrotomy was closed with  #1 Vicryl suture. The subcutaneous tissue with 0 and 2-0 undyed  Vicryl suture, and 4-0 Monocryl.. James dressing of Aquaseal,  4 x 4, dressing sponges, Webril, and Ace wrap applied. Needle and sponge count were correct times 2.The patient awakened, extubated, and taken to recovery room without difficulty. Vascular status was  normal, pulses 2+ and symmetric.   Anne James James 01/18/2016, 11:05 AM

## 2016-01-18 NOTE — Evaluation (Signed)
Physical Therapy Evaluation Patient Details Name: Anne James MRN: [id number] DOB: 02-13-1971 Today's Date: 01/18/2016   History of Present Illness  Pt is a 45 y.o. female, has a history of pain and functional disability in the right knee due to arthritis and has failed non-surgical conservative treatments for greater than 12 weeks. Onset of symptoms was gradual, starting 10 years ago with gradually worsening course since that time  Clinical Impression  Pt is Day 0 post-op R TKA. Pt was able to demonstrate >90 degrees of knee flexion and ambulate 70 ft during today's session. Pt demonstrates understanding of her deficits and was able to perform all exercises without excessive pain or fatigue. Pt is a good candidate for outpatient PT secondary to good family support and strength. Pt is supposed to be getting a RW at some point tomorrow.     Follow Up Recommendations Outpatient PT    Equipment Recommendations  Rolling walker with 5" wheels    Recommendations for Other Services       Precautions / Restrictions Precautions Precautions: Knee Required Braces or Orthoses: Knee Immobilizer - Right Restrictions Weight Bearing Restrictions: Yes RLE Weight Bearing: Weight bearing as tolerated      Mobility  Bed Mobility                  Transfers Overall transfer level: Modified independent Equipment used: Rolling walker (2 wheeled)             General transfer comment: pt required minor verbal cues for sit<>stand  Ambulation/Gait Ambulation/Gait assistance: Min guard Ambulation Distance (Feet): 70 Feet Assistive device: Rolling walker (2 wheeled) Gait Pattern/deviations: Decreased step length - right;Decreased stance time - right;Decreased weight shift to right;Step-to pattern Gait velocity: decreased Gait velocity interpretation: Below normal speed for age/gender General Gait Details: Pt was able to ambulate with min guard without c/o increased pain or fatigue.    Stairs            Wheelchair Mobility    Modified Rankin (Stroke Patients Only)       Balance Overall balance assessment: Needs assistance Sitting-balance support: Feet supported;No upper extremity supported Sitting balance-Leahy Scale: Good     Standing balance support: Bilateral upper extremity supported;During functional activity Standing balance-Leahy Scale: Poor                               Pertinent Vitals/Pain Pain Assessment: 0-10 Pain Score: 8  Pain Location: R LE Pain Descriptors / Indicators: Aching;Stabbing Pain Intervention(s): Limited activity within patient's tolerance;Monitored during session;Premedicated before session;Repositioned    Home Living Family/patient expects to be discharged to:: Private residence Living Arrangements: Spouse/significant other Available Help at Discharge: Family Type of Home: House Home Access: Level entry     Home Layout: One level Home Equipment: Cane - quad;Grab bars - tub/shower;Shower seat Additional Comments: Pt reports RW is coming tomorrow    Prior Function Level of Independence: Independent               Hand Dominance   Dominant Hand: Right    Extremity/Trunk Assessment               Lower Extremity Assessment: Generalized weakness         Communication   Communication: No difficulties  Cognition Arousal/Alertness: Awake/alert Behavior During Therapy: WFL for tasks assessed/performed Overall Cognitive Status: Within Functional Limits for tasks assessed  General Comments      Exercises Total Joint Exercises Ankle Circles/Pumps: AROM;10 reps;Both;Supine Quad Sets: AROM;Right;10 reps;Supine Heel Slides: AROM;Other reps (comment);Supine;Right (7) Long Arc Quad: AROM;5 reps;Seated;Right Goniometric ROM: 21 to 104   Assessment/Plan    PT Assessment Patient needs continued PT services  PT Problem List Decreased strength;Decreased range  of motion;Decreased balance;Decreased mobility;Decreased knowledge of use of DME          PT Treatment Interventions Gait training;DME instruction;Therapeutic activities;Therapeutic exercise;Balance training;Patient/family education;Functional mobility training    PT Goals (Current goals can be found in the Care Plan section)  Acute Rehab PT Goals Patient Stated Goal: wants to go home; wants to go to outpatient physical therapy PT Goal Formulation: With patient Time For Goal Achievement: 01/25/16 Potential to Achieve Goals: Good    Frequency 7X/week   Barriers to discharge        Co-evaluation               End of Session Equipment Utilized During Treatment: Gait belt;Right knee immobilizer Activity Tolerance: Patient tolerated treatment well Patient left: in chair;with call bell/phone within reach Nurse Communication: Mobility status         Time: [id number] PT Time Calculation (min) (ACUTE ONLY): 36 min   Charges:   PT Evaluation $PT Eval Moderate Complexity: 1 Procedure PT Treatments $Gait Training: 8-22 mins   PT G Codes:        Gaye PollackRebecca Tonica Brasington 01/18/2016, 4:45 PM  Gaye Pollackebecca Burney [place], SPT [phone]

## 2016-01-18 NOTE — Anesthesia Procedure Notes (Signed)
Procedure Name: MAC Date/Time: 01/18/2016 9:37 AM Performed by: Carmela RimaMARTINELLI, Shaniquia Brafford F Pre-anesthesia Checklist: Timeout performed, Suction available, Emergency Drugs available, Patient identified and Patient being monitored Oxygen Delivery Method: Nasal cannula Placement Confirmation: positive ETCO2

## 2016-01-18 NOTE — Transfer of Care (Signed)
Immediate Anesthesia Transfer of Care Note  Patient: Anne James  Procedure(s) Performed: Procedure(s): TOTAL KNEE ARTHROPLASTY (Right)  Patient Location: PACU  Anesthesia Type:MAC and Spinal  Level of Consciousness: awake, oriented and patient cooperative  Airway & Oxygen Therapy: Patient Spontanous Breathing and Patient connected to nasal cannula oxygen  Post-op Assessment: Report given to RN and Post -op Vital signs reviewed and stable  Post vital signs: Reviewed  Last Vitals:  Vitals:   01/06/16 1500 01/18/16 0819  BP: 127/76 123/65  Pulse: 67 (!) 58  Resp:  20  Temp: 36.6 C 36.8 C    Last Pain:  Vitals:   01/18/16 0834  TempSrc:   PainSc: 5          Complications: No apparent anesthesia complications

## 2016-01-18 NOTE — Anesthesia Procedure Notes (Signed)
Spinal  Patient location during procedure: OR Start time: 01/18/2016 9:23 AM End time: 01/18/2016 9:26 AM Staffing Anesthesiologist: Gaynelle AduFITZGERALD, Rashae Rother Performed: anesthesiologist  Preanesthetic Checklist Completed: patient identified, surgical consent, pre-op evaluation, timeout performed, IV checked, risks and benefits discussed and monitors and equipment checked Spinal Block Patient position: sitting Prep: DuraPrep Patient monitoring: cardiac monitor, continuous pulse ox and blood pressure Approach: midline Location: L3-4 Injection technique: single-shot Needle Needle type: Pencan  Needle gauge: 24 G Needle length: 9 cm Assessment Sensory level: T8 Additional Notes Functioning IV was confirmed and monitors were applied. Sterile prep and drape, including hand hygiene and sterile gloves were used. The patient was positioned and the spine was prepped. The skin was anesthetized with lidocaine.  Free flow of clear CSF was obtained prior to injecting local anesthetic into the CSF.  The spinal needle aspirated freely following injection.  The needle was carefully withdrawn.  The patient tolerated the procedure well.

## 2016-01-18 NOTE — Progress Notes (Signed)
Orthopedic Tech Progress Note Patient Details:  Anne James 02-01-1971 [id number]  CPM Right Knee CPM Right Knee: On Right Knee Flexion (Degrees): 9 Right Knee Extension (Degrees): 0 Additional Comments: trapeze bar patient helper   [name], Lanson Randle 01/18/2016, 12:36 PM Viewed order from doctor's order list

## 2016-01-19 ENCOUNTER — Encounter (HOSPITAL_COMMUNITY): Payer: Self-pay | Admitting: Orthopedic Surgery

## 2016-01-19 LAB — BASIC METABOLIC PANEL
ANION GAP: 10 (ref 5–15)
BUN: 8 mg/dL (ref 6–20)
CALCIUM: 8.7 mg/dL — AB (ref 8.9–10.3)
CHLORIDE: 102 mmol/L (ref 101–111)
CO2: 24 mmol/L (ref 22–32)
Creatinine, Ser: 0.51 mg/dL (ref 0.44–1.00)
GFR calc non Af Amer: >60 mL/min (ref >60)
GLUCOSE: 143 mg/dL — AB (ref 65–99)
POTASSIUM: 4 mmol/L (ref 3.5–5.1)
Sodium: 136 mmol/L (ref 135–145)

## 2016-01-19 LAB — CBC
HEMATOCRIT: 31.4 % — AB (ref 36.0–46.0)
HEMOGLOBIN: 9.7 g/dL — AB (ref 12.0–15.0)
MCH: 25.1 pg — ABNORMAL LOW (ref 26.0–34.0)
MCHC: 30.9 g/dL (ref 30.0–36.0)
MCV: 81.1 fL (ref 78.0–100.0)
Platelets: 366 10*3/uL (ref 150–400)
RBC: 3.87 MIL/uL (ref 3.87–5.11)
RDW: 16.4 % — ABNORMAL HIGH (ref 11.5–15.5)
WBC: 11 10*3/uL — AB (ref 4.0–10.5)

## 2016-01-19 MED ORDER — INFLUENZA VAC SPLIT QUAD 0.5 ML IM SUSY
0.5000 mL | PREFILLED_SYRINGE | INTRAMUSCULAR | Status: AC
  Administered 2016-01-20: 0.5 mL via INTRAMUSCULAR
  Filled 2016-01-19: qty 0.5

## 2016-01-19 MED ORDER — CLONIDINE HCL 0.1 MG PO TABS
0.1000 mg | ORAL_TABLET | Freq: Every day | ORAL | Status: DC
  Administered 2016-01-19 – 2016-01-20 (×2): 0.1 mg via ORAL
  Filled 2016-01-19 (×3): qty 1

## 2016-01-19 NOTE — Progress Notes (Signed)
Physical Therapy Treatment Patient Details Name: Anne James MRN: [id number] DOB: 06-01-1970 Today's Date: 01/19/2016    History of Present Illness Pt is a 45 y.o. female, has a history of pain and functional disability in the right knee due to arthritis and has failed non-surgical conservative treatments for greater than 12 weeks. Onset of symptoms was gradual, starting 10 years ago with gradually worsening course since that time    PT Comments    Patient continues to progress well with mobility and with decreased pain this session. Continue to progress as tolerated.   Follow Up Recommendations  Outpatient PT     Equipment Recommendations  Rolling walker with 5" wheels    Recommendations for Other Services       Precautions / Restrictions Precautions Precautions: Knee Required Braces or Orthoses: Knee Immobilizer - Right Restrictions Weight Bearing Restrictions: Yes RLE Weight Bearing: Weight bearing as tolerated    Mobility  Bed Mobility Overal bed mobility: Modified Independent Bed Mobility: Supine to Sit           General bed mobility comments: increased time/effort needed  Transfers Overall transfer level: Modified independent Equipment used: Rolling walker (2 wheeled)             General transfer comment: from EOB and BSC; cues for hand placement for safe descent [facility]  Ambulation/Gait Ambulation/Gait assistance: Supervision Ambulation Distance (Feet): 270 Feet Assistive device: Rolling walker (2 wheeled) Gait Pattern/deviations: Step-through pattern;Decreased stride length;Decreased weight shift to right Gait velocity: decreased   General Gait Details: cues for increased R knee fexion during swing phase; pt with improved gait mechanics and WB this session   Stairs            Wheelchair Mobility    Modified Rankin (Stroke Patients Only)       Balance     Sitting balance-Leahy Scale: Good       Standing balance-Leahy Scale:  Poor                      Cognition Arousal/Alertness: Awake/alert Behavior During Therapy: WFL for tasks assessed/performed Overall Cognitive Status: Within Functional Limits for tasks assessed                      Exercises Total Joint Exercises Quad Sets: AROM;Right;10 reps;Supine Heel Slides: AROM;Supine;Right;10 reps Straight Leg Raises: AROM;Right;10 reps;Supine Long Arc Quad: AROM;Right;10 reps;Seated Knee Flexion: AROM;Right;5 reps;Other (comment) (10 sec holds) Goniometric ROM: 8-75    General Comments        Pertinent Vitals/Pain Pain Assessment: 0-10 Pain Score: 4  Pain Location: R knee Pain Descriptors / Indicators: Aching;Sore;Tightness Pain Intervention(s): Monitored during session;Limited activity within patient's tolerance;Premedicated before session;Repositioned;Ice applied    Home Living                      Prior Function            PT Goals (current goals can now be found in the care plan section) Acute Rehab PT Goals Patient Stated Goal: go home and be more active PT Goal Formulation: With patient Time For Goal Achievement: 01/25/16 Potential to Achieve Goals: Good Progress towards PT goals: Progressing toward goals    Frequency    7X/week      PT Plan Current plan remains appropriate    Co-evaluation             End of Session Equipment Utilized During Treatment: Gait belt  Activity Tolerance: Patient tolerated treatment well Patient left: in chair;with call bell/phone within reach     Time: 1336-1400 PT Time Calculation (min) (ACUTE ONLY): 24 min  Charges:  $Gait Training: 8-22 mins $Therapeutic Activity: 8-22 mins                    G Codes:      Derek MoundKellyn R Tuesday Terlecki Salayah Meares, PTA Pager: [phone]   01/19/2016, 2:28 PM

## 2016-01-19 NOTE — Care Management Note (Signed)
Case Management Note  Patient Details  Name: Anne James MRN: [id number] Date of Birth: October 28, 1970  Subjective/Objective:  45 yr old female s/p right total knee arthroplasty.                  Action/Plan: Case manager spoke with patient concerning Home Health and DME needs. Patient was preoperatively setup with [facility], no changes. Patient will have family support at discharge.    Expected Discharge Date:   01/19/16               Expected Discharge Plan:  Home w Home Health Services  In-House Referral:  NA  Discharge planning Services  CM Consult  Post Acute Care Choice:  Home Health Choice offered to:  Patient  DME Arranged:  3-N-1, Walker rolling, CPM DME Agency:  [employer]  HH Arranged:  PT [facility]  Status of Service:  Completed, signed off  If discussed at [employer] Length of Stay Meetings, dates discussed:    Additional Comments:  Durenda GuthrieBrady, [name], RN 01/19/2016, 4:15 PM

## 2016-01-19 NOTE — Progress Notes (Signed)
Subjective: 1 Day Post-Op Procedure(s) (LRB): TOTAL KNEE ARTHROPLASTY (Right) Patient reports pain as 4 on 0-10 scale.    Objective: Vital signs in last 24 hours: Temp:  [97.5 F (36.4 C)-98.2 F (36.8 C)] 98.1 F (36.7 C) (11/07 0500) Pulse Rate:  [58-81] 73 (11/07 0500) Resp:  [16-20] 16 (11/07 0500) BP: (118-171)/(65-93) 171/93 (11/07 0500) SpO2:  [96 %-100 %] 96 % (11/07 0500) Weight:  [85.7 kg (189 lb)] 85.7 kg (189 lb) (11/06 0819)  Intake/Output from previous day: 11/06 0701 - 11/07 0700 In: 1090 [I.V.:1090] Out: 2900 [Urine:2800; Blood:100] Intake/Output this shift: Total I/O In: -  Out: 350 [Urine:350]   Recent Labs  01/18/16 1423  HGB 10.8*    Recent Labs  01/18/16 1423  WBC 12.0*  RBC 4.40  HCT 35.9*  PLT 373    Recent Labs  01/18/16 1423  CREATININE 0.56   No results for input(s): LABPT, INR in the last 72 hours.  ABD soft Neurovascular intact Sensation intact distally Intact pulses distally Dorsiflexion/Plantar flexion intact Incision: dressing C/D/I  Assessment/Plan: 1 Day Post-Op Procedure(s) (LRB): TOTAL KNEE ARTHROPLASTY (Right)  Principal Problem:   Primary localized osteoarthritis of right knee Active Problems:   Obesity (BMI 30-39.9)   Anxiety state   S/P total knee arthroplasty, left   History of pulmonary embolus (PE)  Advance diet Up with therapy D/C IV fluids Discharge home with home health tomorrow Will add clonidine for hypertension and restart clonazepam    Patient is very comfortable and doing well with ambulation  Anne James J 01/19/2016, 7:52 AM

## 2016-01-19 NOTE — Progress Notes (Signed)
Physical Therapy Treatment Patient Details Name: Anne James MRN: [id number] DOB: 05/11/1970 Today's Date: 01/19/2016    History of Present Illness Pt is a 45 y.o. female, has a history of pain and functional disability in the right knee due to arthritis and has failed non-surgical conservative treatments for greater than 12 weeks. Onset of symptoms was gradual, starting 10 years ago with gradually worsening course since that time    PT Comments    Patient is progressing toward mobility goals. Tolerated increase gait distance and therex this session. Current plan remains appropriate.   Follow Up Recommendations  Outpatient PT     Equipment Recommendations  Rolling walker with 5" wheels    Recommendations for Other Services       Precautions / Restrictions Precautions Precautions: Knee Required Braces or Orthoses: Knee Immobilizer - Right Restrictions Weight Bearing Restrictions: Yes RLE Weight Bearing: Weight bearing as tolerated    Mobility  Bed Mobility Overal bed mobility: Modified Independent Bed Mobility: Supine to Sit           General bed mobility comments: increased time/effort needed  Transfers Overall transfer level: Modified independent Equipment used: Rolling walker (2 wheeled)             General transfer comment: cues for safe hand placement  Ambulation/Gait Ambulation/Gait assistance: Supervision Ambulation Distance (Feet): 200 Feet Assistive device: Rolling walker (2 wheeled) Gait Pattern/deviations: Step-through pattern;Decreased stance time - right;Decreased weight shift to right;Antalgic Gait velocity: decreased   General Gait Details: slightly antalgic gait this session however with improved step length symmetry and ability to WB on R LE with increased distance   Stairs            Wheelchair Mobility    Modified Rankin (Stroke Patients Only)       Balance     Sitting balance-Leahy Scale: Good       Standing  balance-Leahy Scale: Poor                      Cognition Arousal/Alertness: Awake/alert Behavior During Therapy: WFL for tasks assessed/performed Overall Cognitive Status: Within Functional Limits for tasks assessed                      Exercises Total Joint Exercises Quad Sets: AROM;Right;10 reps;Supine Heel Slides: AROM;Supine;Right;10 reps Straight Leg Raises: AROM;Right;10 reps;Supine Goniometric ROM: 8-75    General Comments        Pertinent Vitals/Pain Pain Assessment: 0-10 Pain Score: 6  Pain Location: R knee Pain Descriptors / Indicators: Aching;Sore Pain Intervention(s): Limited activity within patient's tolerance;Monitored during session;Premedicated before session;Repositioned    Home Living                      Prior Function            PT Goals (current goals can now be found in the care plan section) Acute Rehab PT Goals Patient Stated Goal: wants to go home; wants to go to outpatient physical therapy PT Goal Formulation: With patient Time For Goal Achievement: 01/25/16 Potential to Achieve Goals: Good Progress towards PT goals: Progressing toward goals    Frequency    7X/week      PT Plan Current plan remains appropriate    Co-evaluation             End of Session Equipment Utilized During Treatment: Gait belt Activity Tolerance: Patient tolerated treatment well Patient left: in bed;with call bell/phone  within reach     Time: 1027-1055 PT Time Calculation (min) (ACUTE ONLY): 28 min  Charges:  $Gait Training: 8-22 mins $Therapeutic Exercise: 8-22 mins                    G Codes:      Derek MoundKellyn R Madison Albea Shiri Hodapp, PTA Pager: [phone]   01/19/2016, 1:14 PM

## 2016-01-19 NOTE — Evaluation (Signed)
Occupational Therapy Evaluation Patient Details Name: Anne James MRN: [id number] DOB: [birth year] Today's Date: 01/19/2016    History of Present Illness Pt is a 45 y.o. female, has a history of pain and functional disability in the right knee due to arthritis and has failed non-surgical conservative treatments for greater than 12 weeks. Onset of symptoms was gradual, starting 10 years ago with gradually worsening course since that time   Clinical Impression   Pt currently supervision level to modified independent for selfcare tasks and toileting.  Needs max assist for shower transfer with attempt to step over the edge of the tub as she had done after previous knee replacement.  Unable to complete but pt will sponge bathe until she can, even though she is aware of tub bench availability.  No further OT needs at this time with have intermittent supervision from family.      Follow Up Recommendations  Supervision - Intermittent;No OT follow up    Equipment Recommendations  None recommended by OT       Precautions / Restrictions Precautions Precautions: Knee Required Braces or Orthoses: Knee Immobilizer - Right Restrictions Weight Bearing Restrictions: No RLE Weight Bearing: Weight bearing as tolerated      Mobility Bed Mobility Overal bed mobility: Modified Independent Bed Mobility: Supine to Sit     Supine to sit: Modified independent (Device/Increase time)        Transfers Overall transfer level: Modified independent Equipment used: Rolling walker (2 wheeled)                  Balance     Sitting balance-Leahy Scale: Good       Standing balance-Leahy Scale: Fair                              ADL Overall ADL's : Needs assistance/impaired Eating/Feeding: Independent   Grooming: Wash/dry hands;Wash/dry face;Oral care;Supervision/safety;Standing   Upper Body Bathing: Set up;Sitting   Lower Body Bathing: Supervison/ safety;Sit to/from  stand   Upper Body Dressing : Set up;Sitting   Lower Body Dressing: Minimal assistance;Sit to/from stand   Toilet Transfer: Supervision/safety;RW;Ambulation;BSC   Toileting- ArchitectClothing Manipulation and Hygiene: Supervision/safety;Sit to/from stand   Tub/ Shower Transfer: Maximal assistance   Functional mobility during ADLs: Supervision/safety;Rolling walker General ADL Comments: Pt supervision level for all transfers and mobility.  She was not able to step over the edge of the tub this session but was able after previous knee replacement in April.  She will sponge bathe until she can step over and use the current shower seat that she has.       Vision Vision Assessment?: No apparent visual deficits   Perception     Praxis Praxis Praxis tested?: Within functional limits    Pertinent Vitals/Pain Pain Assessment: 0-10 Pain Score: 4  Pain Location: right LE Pain Descriptors / Indicators: Discomfort Pain Intervention(s): Monitored during session     Hand Dominance Right   Extremity/Trunk Assessment Upper Extremity Assessment Upper Extremity Assessment: Overall WFL for tasks assessed   Lower Extremity Assessment Lower Extremity Assessment: Defer to PT evaluation   Cervical / Trunk Assessment Cervical / Trunk Assessment: Normal   Communication Communication Communication: No difficulties   Cognition Arousal/Alertness: Awake/alert Behavior During Therapy: WFL for tasks assessed/performed Overall Cognitive Status: Within Functional Limits for tasks assessed  Home Living Family/patient expects to be discharged to:: Private residence Living Arrangements: Spouse/significant other Available Help at Discharge: Family Type of Home: House Home Access: Level entry     Home Layout: One level     Bathroom Shower/Tub: Tub/shower unit Shower/tub characteristics: Curtain FirefighterBathroom Toilet: Standard     Home Equipment: Cane -  quad;Grab bars - tub/shower;Shower seat   Additional Comments: Pt reports RW is coming tomorrow      Prior Functioning/Environment Level of Independence: Independent                                        End of Session Equipment Utilized During Treatment: Rolling walker;Right knee immobilizer CPM Right Knee CPM Right Knee: Off Nurse Communication: Mobility status  Activity Tolerance: Patient tolerated treatment well Patient left: in chair;with call bell/phone within reach   Time: [id number] OT Time Calculation (min): 28 min Charges:  OT General Charges $OT Visit: 1 Procedure OT Evaluation $OT Eval Moderate Complexity: 1 Procedure OT Treatments $Self Care/Home Management : 8-22 mins  Candis Kabel OTR/L 01/19/2016, 8:54 AM

## 2016-01-20 LAB — BASIC METABOLIC PANEL
Anion gap: 9 (ref 5–15)
BUN: 6 mg/dL (ref 6–20)
CHLORIDE: 103 mmol/L (ref 101–111)
CO2: 28 mmol/L (ref 22–32)
Calcium: 9.1 mg/dL (ref 8.9–10.3)
Creatinine, Ser: 0.51 mg/dL (ref 0.44–1.00)
GFR calc Af Amer: >60 mL/min (ref >60)
GFR calc non Af Amer: >60 mL/min (ref >60)
GLUCOSE: 103 mg/dL — AB (ref 65–99)
POTASSIUM: 4.5 mmol/L (ref 3.5–5.1)
SODIUM: 140 mmol/L (ref 135–145)

## 2016-01-20 LAB — CBC
HCT: 29.2 % — ABNORMAL LOW (ref 36.0–46.0)
HEMOGLOBIN: 9 g/dL — AB (ref 12.0–15.0)
MCH: 24.9 pg — AB (ref 26.0–34.0)
MCHC: 30.8 g/dL (ref 30.0–36.0)
MCV: 80.9 fL (ref 78.0–100.0)
Platelets: 365 10*3/uL (ref 150–400)
RBC: 3.61 MIL/uL — AB (ref 3.87–5.11)
RDW: 16.4 % — ABNORMAL HIGH (ref 11.5–15.5)
WBC: 9.1 10*3/uL (ref 4.0–10.5)

## 2016-01-20 MED ORDER — POLYETHYLENE GLYCOL 3350 17 G PO PACK
PACK | ORAL | 0 refills | Status: DC
Start: 2016-01-20 — End: 2016-06-14

## 2016-01-20 MED ORDER — OXYCODONE HCL ER 20 MG PO T12A: 20.0000 mg | EXTENDED_RELEASE_TABLET | Freq: Two times a day (BID) | ORAL | 0 refills | Status: DC

## 2016-01-20 MED ORDER — ENOXAPARIN SODIUM 30 MG/0.3ML ~~LOC~~ SOLN: 30.0000 mg | Freq: Two times a day (BID) | SUBCUTANEOUS | 0 refills | Status: DC

## 2016-01-20 MED ORDER — OXYCODONE HCL 5 MG PO TABS: ORAL_TABLET | ORAL | 0 refills | Status: DC

## 2016-01-20 MED ORDER — ACETAMINOPHEN 325 MG PO TABS: 650.0000 mg | ORAL_TABLET | Freq: Four times a day (QID) | ORAL | Status: DC | PRN

## 2016-01-20 MED ORDER — DOCUSATE SODIUM 100 MG PO CAPS: ORAL_CAPSULE | ORAL | 0 refills | Status: DC

## 2016-01-20 NOTE — Progress Notes (Signed)
Pt ready for d/c home today after PT per MD. Pt met PT goals, and has all needed equipment. Discharge instructions and prescriptions reviewed with pt and husband, all questions answered. Belongings packed and will be sent with pt.  [place], Jerry Caras

## 2016-01-20 NOTE — Progress Notes (Signed)
Physical Therapy Treatment Patient Details Name: Anne FlavorsLori D James MRN: [id number] DOB: April 29, 1970 Today's Date: 01/20/2016    History of Present Illness Pt is a 45 y.o. female, has a history of pain and functional disability in the right knee due to arthritis and has failed non-surgical conservative treatments for greater than 12 weeks. Onset of symptoms was gradual, starting 10 years ago with gradually worsening course since that time    PT Comments    Patient is making good progress with PT.  From a mobility standpoint anticipate patient will be ready for DC home when medically ready.     Follow Up Recommendations  Outpatient PT     Equipment Recommendations  Rolling walker with 5" wheels    Recommendations for Other Services       Precautions / Restrictions Precautions Precautions: Knee Precaution Comments: pt has good understanding of precautions Restrictions Weight Bearing Restrictions: Yes RLE Weight Bearing: Weight bearing as tolerated    Mobility  Bed Mobility               General bed mobility comments: OOB in chair upon arrival  Transfers Overall transfer level: Modified independent Equipment used: Rolling walker (2 wheeled)             General transfer comment: from recliner; good technique and safety awareness  Ambulation/Gait Ambulation/Gait assistance: Supervision Ambulation Distance (Feet): 300 Feet Assistive device: Rolling walker (2 wheeled) Gait Pattern/deviations: Step-through pattern;Decreased stride length Gait velocity: decreased   General Gait Details: cues for posture and increased R knee flexion during swing phase; steady gait with good R knee extension and bilat heel strike   Stairs            Wheelchair Mobility    Modified Rankin (Stroke Patients Only)       Balance     Sitting balance-Leahy Scale: Good       Standing balance-Leahy Scale: Poor                      Cognition Arousal/Alertness:  Awake/alert Behavior During Therapy: WFL for tasks assessed/performed Overall Cognitive Status: Within Functional Limits for tasks assessed                      Exercises Total Joint Exercises Heel Slides: AROM;Right;15 reps;Seated Straight Leg Raises: AROM;Right;15 reps;Seated Long Arc Quad: AROM;Right;15 reps;Seated Goniometric ROM: 5-85    General Comments General comments (skin integrity, edema, etc.): husband present for session      Pertinent Vitals/Pain Pain Assessment: Faces Faces Pain Scale: Hurts a little bit Pain Location: R knee Pain Descriptors / Indicators: Discomfort;Sore Pain Intervention(s): Limited activity within patient's tolerance;Monitored during session;Premedicated before session;Repositioned;Ice applied    Home Living                      Prior Function            PT Goals (current goals can now be found in the care plan section) Acute Rehab PT Goals Patient Stated Goal: get back to exercising  PT Goal Formulation: With patient Time For Goal Achievement: 01/25/16 Potential to Achieve Goals: Good Progress towards PT goals: Progressing toward goals    Frequency    7X/week      PT Plan Current plan remains appropriate    Co-evaluation             End of Session Equipment Utilized During Treatment: Gait belt Activity Tolerance: Patient tolerated treatment  well Patient left: in chair;with call bell/phone within reach;with family/visitor present     Time: 1109-1130 PT Time Calculation (min) (ACUTE ONLY): 21 min  Charges:  $Gait Training: 8-22 mins                    G Codes:      Derek MoundKellyn R Marsheila Alejo Marsean Elkhatib, PTA Pager: [phone]   01/20/2016, 11:39 AM

## 2016-01-20 NOTE — Discharge Summary (Signed)
Patient ID: Anne James MRN: [id number] DOB/AGE: [birth year] 45 y.o.  Admit date: 01/18/2016 Discharge date: 01/20/2016  Admission Diagnoses:  Principal Problem:   Primary localized osteoarthritis of right knee Active Problems:   Obesity (BMI 30-39.9)   Anxiety state   S/P total knee arthroplasty, left   History of pulmonary embolus (PE)   Discharge Diagnoses:  Same  Past Medical History:  Diagnosis Date  . Anemia    low iron  . Anxiety   . 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 Chicken pox   . Depression   . Eating disorder   . GERD (gastroesophageal reflux disease)   . 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 Phlebitis   . Primary localized osteoarthritis of left knee   . Primary localized osteoarthritis of right knee 01/06/2016  . 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 S/P total knee arthroplasty, 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    Surgeries: Procedure(s): TOTAL KNEE ARTHROPLASTY on 01/18/2016   Consultants:   Discharged Condition: Improved  Hospital Course: Anne James is an 45 y.o. female who was admitted 01/18/2016 for operative treatment ofPrimary localized osteoarthritis of right knee. Patient has severe unremitting pain that affects sleep, daily activities, and work/hobbies. After pre-op clearance the patient was taken to the operating room on 01/18/2016 and underwent  Procedure(s): TOTAL KNEE ARTHROPLASTY.    Patient was given perioperative antibiotics: Anti-infectives    Start     Dose/Rate Route Frequency Ordered Stop   01/18/16 1700  ceFAZolin (ANCEF) IVPB 2g/100 mL premix     2 g 200 mL/hr over 30 Minutes Intravenous Every 8 hours 01/18/16 1329 01/19/16 0153   01/18/16 0600  ceFAZolin (ANCEF) IVPB 2g/100 mL premix     2 g 200 mL/hr over 30 Minutes Intravenous On call to O.R. 01/17/16 1400 01/18/16 0931       Patient was given sequential compression devices, early ambulation, and chemoprophylaxis to prevent  DVT.  Patient benefited maximally from hospital stay and there were no complications.    Recent vital signs: Patient Vitals for the past 24 hrs:  BP Temp Temp src Pulse Resp SpO2  01/20/16 0400 124/65 98.1 F (36.7 C) Oral 72 16 97 %  01/19/16 2015 125/74 98.2 F (36.8 C) Oral 87 16 97 %  01/19/16 1300 (!) 102/58 98.4 F (36.9 C) Oral (!) 104 16 93 %  01/19/16 1135 127/73 - - - - -  01/19/16 0850 - - - (!) 5 - 98 %     Recent laboratory studies:  Recent Labs  01/19/16 0649 01/20/16 0653  WBC 11.0* 9.1  HGB 9.7* 9.0*  HCT 31.4* 29.2*  PLT 366 365  NA 136 140  K 4.0 4.5  CL 102 103  CO2 24 28  BUN 8 6  CREATININE 0.51 0.51  GLUCOSE 143* 103*  CALCIUM 8.7* 9.1     Discharge Medications:     Medication List    STOP taking these medications   celecoxib 200 MG capsule Commonly known as:  CELEBREX   HYDROcodone-acetaminophen 5-325 MG tablet Commonly known as:  NORCO/VICODIN     TAKE these medications   acetaminophen 325 MG tablet Commonly known as:  TYLENOL Take 2 tablets (650 mg total) by mouth every 6 (six) hours as needed for mild pain (or Fever >/= 101).   buPROPion 150 MG 24 hr tablet Commonly known as:  WELLBUTRIN XL Take 1 tablet (150 mg total)  by mouth daily.   clonazePAM 0.5 MG tablet Commonly known as:  KLONOPIN Take 1 tablet (0.5 mg total) by mouth 2 (two) times daily as needed for anxiety.   cyanocobalamin 500 MCG tablet Take 500 mcg by mouth daily.   docusate sodium 100 MG capsule Commonly known as:  COLACE 1 tab 2 times a day while on narcotics.  STOOL SOFTENER   enoxaparin 30 MG/0.3ML injection Commonly known as:  LOVENOX Inject 0.3 mLs (30 mg total) into the skin every 12 (twelve) hours.   FLUoxetine 40 MG capsule Commonly known as:  PROZAC Take 1 capsule (40 mg total) by mouth daily.   multivitamin tablet Take 1 tablet by mouth daily.   oxyCODONE 5 MG immediate release tablet Commonly known as:  Oxy IR/ROXICODONE 1-2 tablets  every 4-6 hrs as needed for pain   oxyCODONE 20 mg 12 hr tablet Commonly known as:  OXYCONTIN Take 1 tablet (20 mg total) by mouth every 12 (twelve) hours.   polyethylene glycol packet Commonly known as:  MIRALAX / GLYCOLAX 17grams in 16 oz of water twice a day until bowel movement.  LAXITIVE.  Restart if two days since last bowel movement   ranitidine 150 MG tablet Commonly known as:  ZANTAC Take 150 mg by mouth daily as needed for heartburn.   Vitamin D-3 5000 UNITS Tabs Take 1 tablet by mouth daily.       Diagnostic Studies: No results found.  Disposition: 01-Home or Self Care  Discharge Instructions    CPM    Complete by:  As directed    Continuous passive motion machine (CPM):      Use the CPM from 0 to 90 for 6 hours per day.       You may break it up into 2 or 3 sessions per day.      Use CPM for 2 weeks or until you are told to stop.   Call MD / Call 911    Complete by:  As directed    If you experience chest pain or shortness of breath, CALL 911 and be transported to the hospital emergency room.  If you develope a fever above 101 F, pus (white drainage) or increased drainage or redness at the wound, or calf pain, call your surgeon's office.   Change dressing    Complete by:  As directed    Change the gauze dressing daily with sterile 4 x 4 inch gauze and apply TED hose.  DO NOT REMOVE BANDAGE OVER SURGICAL INCISION.  WASH WHOLE LEG INCLUDING OVER THE WATERPROOF BANDAGE WITH SOAP AND WATER EVERY DAY.   Constipation Prevention    Complete by:  As directed    Drink plenty of fluids.  Prune juice may be helpful.  You may use a stool softener, such as Colace (over the counter) 100 mg twice a day.  Use MiraLax (over the counter) for constipation as needed.   Diet - low sodium heart healthy    Complete by:  As directed    Discharge instructions    Complete by:  As directed    INSTRUCTIONS AFTER JOINT REPLACEMENT   Remove items at home which could result in a fall. This  includes throw rugs or furniture in walking pathways ICE to the affected joint every three hours while awake for 30 minutes at a time, for at least the first 3-5 days, and then as needed for pain and swelling.  Continue to use ice for pain and swelling. You  may notice swelling that will progress down to the foot and ankle.  This is normal after surgery.  Elevate your leg when you are not up walking on it.   Continue to use the breathing machine you got in the hospital (incentive spirometer) which will help keep your temperature down.  It is common for your temperature to cycle up and down following surgery, especially at night when you are not up moving around and exerting yourself.  The breathing machine keeps your lungs expanded and your temperature down.   DIET:  As you were doing prior to hospitalization, we recommend a well-balanced diet.  DRESSING / WOUND CARE / SHOWERING  Keep the surgical dressing until follow up.  The dressing is water proof, so you can shower without any extra covering.  IF THE DRESSING FALLS OFF or the wound gets wet inside, change the dressing with sterile gauze.  Please use good hand washing techniques before changing the dressing.  Do not use any lotions or creams on the incision until instructed by your surgeon.    ACTIVITY  Increase activity slowly as tolerated, but follow the weight bearing instructions below.   No driving for 6 weeks or until further direction given by your physician.  You cannot drive while taking narcotics.  No lifting or carrying greater than 10 lbs. until further directed by your surgeon. Avoid periods of inactivity such as sitting longer than an hour when not asleep. This helps prevent blood clots.  You may return to work once you are authorized by your doctor.     WEIGHT BEARING   Weight bearing as tolerated with assist device (walker, cane, etc) as directed, use it as long as suggested by your surgeon or therapist, typically at least  2-3 weeks.   EXERCISES  Results after joint replacement surgery are often greatly improved when you follow the exercise, range of motion and muscle strengthening exercises prescribed by your doctor. Safety measures are also important to protect the joint from further injury. Any time any of these exercises cause you to have increased pain or swelling, decrease what you are doing until you are comfortable again and then slowly increase them. If you have problems or questions, call your caregiver or physical therapist for advice.   Rehabilitation is important following a joint replacement. After just a few days of immobilization, the muscles of the leg can become weakened and shrink (atrophy).  These exercises are designed to build up the tone and strength of the thigh and leg muscles and to improve motion. Often times heat used for twenty to thirty minutes before working out will loosen up your tissues and help with improving the range of motion but do not use heat for the first two weeks following surgery (sometimes heat can increase post-operative swelling).   These exercises can be done on a training (exercise) mat, on the floor, on a table or on a bed. Use whatever works the best and is most comfortable for you.    Use music or television while you are exercising so that the exercises are a pleasant break in your day. This will make your life better with the exercises acting as a break in your routine that you can look forward to.   Perform all exercises about fifteen times, three times per day or as directed.  You should exercise both the operative leg and the other leg as well.   Exercises include:  Quad Sets - Tighten up the muscle on the  front of the thigh (Quad) and hold for 5-10 seconds.   Straight Leg Raises - With your knee straight (if you were given a brace, keep it on), lift the leg to 60 degrees, hold for 3 seconds, and slowly lower the leg.  Perform this exercise against resistance  later as your leg gets stronger.  Leg Slides: Lying on your back, slowly slide your foot toward your buttocks, bending your knee up off the floor (only go as far as is comfortable). Then slowly slide your foot back down until your leg is flat on the floor again.  Angel Wings: Lying on your back spread your legs to the side as far apart as you can without causing discomfort.  Hamstring Strength:  Lying on your back, push your heel against the floor with your leg straight by tightening up the muscles of your buttocks.  Repeat, but this time bend your knee to a comfortable angle, and push your heel against the floor.  You may put a pillow under the heel to make it more comfortable if necessary.   A rehabilitation program following joint replacement surgery can speed recovery and prevent re-injury in the future due to weakened muscles. Contact your doctor or a physical therapist for more information on knee rehabilitation.    CONSTIPATION  Constipation is defined medically as fewer than three stools per week and severe constipation as less than one stool per week.  Even if you have a regular bowel pattern at home, your normal regimen is likely to be disrupted due to multiple reasons following surgery.  Combination of anesthesia, postoperative narcotics, change in appetite and fluid intake all can affect your bowels.   YOU MUST use at least one of the following options; they are listed in order of increasing strength to get the job done.  They are all available over the counter, and you may need to use some, POSSIBLY even all of these options:    Drink plenty of fluids (prune juice may be helpful) and high fiber foods Colace 100 mg by mouth twice a day  Senokot for constipation as directed and as needed Dulcolax (bisacodyl), take with full glass of water  Miralax (polyethylene glycol) once or twice a day as needed.  If you have tried all these things and are unable to have a bowel movement in the first  3-4 days after surgery call either your surgeon or your primary doctor.    If you experience loose stools or diarrhea, hold the medications until you stool forms back up.  If your symptoms do not get better within 1 week or if they get worse, check with your doctor.  If you experience "the worst abdominal pain ever" or develop nausea or vomiting, please contact the office immediately for further recommendations for treatment.   ITCHING:  If you experience itching with your medications, try taking only a single pain pill, or even half a pain pill at a time.  You can also use Benadryl over the counter for itching or also to help with sleep.   TED HOSE STOCKINGS:  Use stockings on both legs until for at least 2 weeks or as directed by physician office. They may be removed at night for sleeping.  MEDICATIONS:  See your medication summary on the "After Visit Summary" that nursing will review with you.  You may have some home medications which will be placed on hold until you complete the course of blood thinner medication.  It is important for  you to complete the blood thinner medication as prescribed.  PRECAUTIONS:  If you experience chest pain or shortness of breath - call 911 immediately for transfer to the hospital emergency department.   If you develop a fever greater that 101 F, purulent drainage from wound, increased redness or drainage from wound, foul odor from the wound/dressing, or calf pain - CONTACT YOUR SURGEON.                                                   FOLLOW-UP APPOINTMENTS:  If you do not already have a post-op appointment, please call the office for an appointment to be seen by your surgeon.  Guidelines for how soon to be seen are listed in your "After Visit Summary", but are typically between 1-4 weeks after surgery.  OTHER INSTRUCTIONS:   Knee Replacement:  Do not place pillow under knee, focus on keeping the knee straight while resting. CPM instructions: 0-90 degrees, 2  hours in the morning, 2 hours in the afternoon, and 2 hours in the evening. Place foam block, curve side up under heel at all times except when in CPM or when walking.  DO NOT modify, tear, cut, or change the foam block in any way.  MAKE SURE YOU:  Understand these instructions.  Get help right away if you are not doing well or get worse.    Thank you for letting us be a part of your medical care team.  It is a privilege we respect greatly.  We hope these instructions will help you stay on track for a fast and full recovery!   Do not put a pillow under the knee. Place it under the heel.    Complete by:  As directed    Place gray foam block, curve side up under heel at all times except when in CPM or when walking.  DO NOT modify, tear, cut, or change in any way the gray foam block.   Increase activity slowly as tolerated    Complete by:  As directed    Patient may shower    Complete by:  As directed    Aquacel dressing is water proof    Wash over it and the whole leg with soap and water at the end of your shower   TED hose    Complete by:  As directed    Use stockings (TED hose) for 2 weeks on both leg(s).  You may remove them at night for sleeping.      Follow-up Information    [facility] Follow up.   Specialty:  Home Health Services Why:  Someone from [facility] will contact you to arrange start date and time for therapy.  Contact information: [address] [place] [address] [phone]        Nilda SimmerWAINER,ROBERT A, MD Follow up on 02/01/2016.   Specialty:  Orthopedic Surgery Why:  as scheduled Contact information: [address] [place] [address] [phone]            Signed: Pascal LuxSHEPPERSON,Dewey Neukam J 01/20/2016, 8:38 AM

## 2016-02-01 ENCOUNTER — Ambulatory Visit: Payer: Managed Care, Other (non HMO) | Admitting: Family Medicine

## 2016-02-02 ENCOUNTER — Other Ambulatory Visit: Payer: Self-pay | Admitting: Family Medicine

## 2016-02-03 NOTE — Telephone Encounter (Signed)
Called and confirmed that the patient has been taking this. It was discontinued prior to her recent orthopedic surgery though was resumed after the surgery. She's no longer on Lovenox. We will refill her Celebrex.

## 2016-02-03 NOTE — Telephone Encounter (Signed)
No on medication list.

## 2016-02-25 ENCOUNTER — Other Ambulatory Visit: Payer: Self-pay | Admitting: Family Medicine

## 2016-02-29 ENCOUNTER — Other Ambulatory Visit: Payer: Self-pay | Admitting: Family Medicine

## 2016-02-29 MED ORDER — CLONAZEPAM 0.5 MG PO TABS: 0.5000 mg | ORAL_TABLET | Freq: Two times a day (BID) | ORAL | 0 refills | Status: DC | PRN

## 2016-03-02 ENCOUNTER — Ambulatory Visit (INDEPENDENT_AMBULATORY_CARE_PROVIDER_SITE_OTHER): Payer: Managed Care, Other (non HMO) | Admitting: Family Medicine

## 2016-03-02 ENCOUNTER — Encounter: Payer: Self-pay | Admitting: Family Medicine

## 2016-03-02 VITALS — BP 128/90 | HR 70 | Temp 98.0°F | Wt 185.8 lb

## 2016-03-02 DIAGNOSIS — M542 Cervicalgia: Secondary | ICD-10-CM | POA: Diagnosis not present

## 2016-03-02 DIAGNOSIS — F418 Other specified anxiety disorders: Secondary | ICD-10-CM

## 2016-03-02 DIAGNOSIS — R202 Paresthesia of skin: Secondary | ICD-10-CM

## 2016-03-02 DIAGNOSIS — Z96651 Presence of right artificial knee joint: Secondary | ICD-10-CM | POA: Insufficient documentation

## 2016-03-02 DIAGNOSIS — F329 Major depressive disorder, single episode, unspecified: Secondary | ICD-10-CM

## 2016-03-02 DIAGNOSIS — F419 Anxiety disorder, unspecified: Secondary | ICD-10-CM

## 2016-03-02 NOTE — Progress Notes (Signed)
Pre visit review using our clinic review tool, if applicable. No additional management support is needed unless otherwise documented below in the visit note. 

## 2016-03-02 NOTE — Assessment & Plan Note (Signed)
Continues to have issues with this and paresthesias in her left upper arm. Neurologically intact today. Positive Spurling's, though otherwise benign neck exam. We will proceed with MRI cervical spine to evaluate further. She'll continue to monitor. Given return precautions.

## 2016-03-02 NOTE — Progress Notes (Signed)
Anne AlarEric Sonnenberg, MD Phone: [phone]  Anne James is a 45 y.o. female who presents today for follow-up.  Anxiety: Patient notes it is a little rough right now. Taking Klonopin 1-2 times a day. Also taking Prozac and Wellbutrin. She recently went back to work. 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 She just had a knee replaced. Some depression. No SI or HI. Has not seen a therapist in several years.  Right knee replacement: Patient notes she is doing overall pretty well with this. Notes slight stiffness. Stretches it. She finished PT last week. Continuing to follow-up with orthopedics. Taking oxycodone 5 mg as needed for this.  Left arm paresthesias: Patient notes continued issues with tingling in her upper left arm. Feels as though there is a pulling sensation on the left posterior aspect of her neck. No numbness or weakness. She deferred obtaining an MRI previously though she is interested in this now.  PMH: nonsmoker.   ROS see history of present illness  Objective  Physical Exam Vitals:   03/02/16 1603  BP: 128/90  Pulse: 70  Temp: 98 F (36.7 C)    BP Readings from Last 3 Encounters:  03/02/16 128/90  01/20/16 124/65  01/07/16 127/68   Wt Readings from Last 3 Encounters:  03/02/16 185 lb 12.8 oz (84.3 kg)  01/18/16 189 lb (85.7 kg)  01/07/16 189 lb (85.7 kg)    Physical Exam  Constitutional: No distress.  Cardiovascular: Normal rate, regular rhythm and normal heart sounds.   Pulmonary/Chest: Effort normal and breath sounds normal.  Musculoskeletal:  Right knee with midline scar anteriorly, well healing, no tenderness, no warmth, no swelling, no midline neck tenderness, no muscular neck tenderness, positive Spurling's on the left, ative Spurling's on the right  Neurological: She is alert.  5/5 strength in bilateral biceps, triceps, grip, quads, hamstrings, plantar and dorsiflexion, sensation to light touch intact in bilateral UE and LE, normal gait    Skin: Skin is warm and dry. She is not diaphoretic.     Assessment/Plan: Please see individual problem list.  Anxiety and depression Patient with anxiety and depression. She'll continue Prozac, Wellbutrin, and Klonopin. Discussed seeing a therapist though she wants to hold off on this until the new year. If she decides to do that she will do this through work. Given return precautions.  Neck pain Continues to have issues with this and paresthesias in her left upper arm. Neurologically intact today. Positive Spurling's, though otherwise benign neck exam. We will proceed with MRI cervical spine to evaluate further. She'll continue to monitor. Given return precautions.  Status post right knee replacement Overall doing well. She'll continue to follow with orthopedics. Continue to do exercises.   Orders Placed This Encounter  Procedures  . MR Cervical Spine Wo Contrast    Standing Status:   Future    Standing Expiration Date:   05/03/2017    Order Specific Question:   Reason for Exam (SYMPTOM  OR DIAGNOSIS REQUIRED)    Answer:   left upper extremity paresthesias    Order Specific Question:   Preferred imaging location?    Answer:   [facility] (table limit-300lbs)    Order Specific Question:   What is the patient's sedation requirement?    Answer:   No Sedation    Order Specific Question:   Does the patient have a pacemaker or implanted devices?    Answer:   No    Comments:   patient has bilateral knee  repleacements, most recent replacement done 6 weeks ago    Anne AlarEric Sonnenberg, MD [facility]

## 2016-03-02 NOTE — Assessment & Plan Note (Signed)
Patient with anxiety and depression. She'll continue Prozac, Wellbutrin, and Klonopin. Discussed seeing a therapist though she wants to hold off on this until the new year. If she decides to do that she will do this through work. Given return precautions.

## 2016-03-02 NOTE — Assessment & Plan Note (Signed)
Overall doing well. She'll continue to follow with orthopedics. Continue to do exercises.

## 2016-03-02 NOTE — Patient Instructions (Signed)
Nice to see you. We are going to obtain an MRI of your neck. Please continue to work on exercises for your knee. Please monitor your anxiety and if it worsens please let us know. If you develop numbness, weakness, thoughts of harming herself or others, or any new or changing symptoms please seek medical attention immediately.

## 2016-05-31 ENCOUNTER — Telehealth: Payer: Self-pay | Admitting: Family Medicine

## 2016-05-31 ENCOUNTER — Ambulatory Visit: Payer: Managed Care, Other (non HMO) | Admitting: Family Medicine

## 2016-05-31 NOTE — Telephone Encounter (Signed)
Appointment already cancelled.

## 2016-05-31 NOTE — Telephone Encounter (Signed)
FYI, Pt called stating she has to go out of town due to work and not able to make her appt today. Do you want me to cancel appt? Thank you!

## 2016-05-31 NOTE — Telephone Encounter (Signed)
Please advise 

## 2016-06-14 ENCOUNTER — Encounter: Payer: Self-pay | Admitting: Family Medicine

## 2016-06-14 ENCOUNTER — Ambulatory Visit (INDEPENDENT_AMBULATORY_CARE_PROVIDER_SITE_OTHER): Payer: 59 | Admitting: Family Medicine

## 2016-06-14 DIAGNOSIS — F418 Other specified anxiety disorders: Secondary | ICD-10-CM | POA: Diagnosis not present

## 2016-06-14 DIAGNOSIS — F419 Anxiety disorder, unspecified: Principal | ICD-10-CM

## 2016-06-14 DIAGNOSIS — E669 Obesity, unspecified: Secondary | ICD-10-CM | POA: Diagnosis not present

## 2016-06-14 DIAGNOSIS — M542 Cervicalgia: Secondary | ICD-10-CM | POA: Diagnosis not present

## 2016-06-14 DIAGNOSIS — F329 Major depressive disorder, single episode, unspecified: Secondary | ICD-10-CM

## 2016-06-14 MED ORDER — CLONAZEPAM 0.5 MG PO TABS: 0.5000 mg | ORAL_TABLET | Freq: Two times a day (BID) | ORAL | 0 refills | Status: DC | PRN

## 2016-06-14 NOTE — Assessment & Plan Note (Addendum)
Essentially stable. She does not want to add anything to her regimen. She'll continue her current medications. Refill Klonopin. Continue to monitor.

## 2016-06-14 NOTE — Progress Notes (Signed)
  Marikay Alar, MD Phone: [phone]  Anne James is a 46 y.o. female who presents today for follow-up.  Anxiety/depression: Patient notes this might be slightly better. She is taking Prozac and Wellbutrin. Has not taken Klonopin in a couple of months as she has been out of it. No SI.  Left arm paresthesias: Notes these are improved though still occasionally gets tingling from her left lateral neck into her left shoulder. No numbness or weakness. Some neck pain. Has not gotten the MRI yet. Takes Celebrex and BC powders and Tylenol at times.  Obesity: Patient notes weight gain. She's not been exercising. Reports she's been cleared to resume activity by her orthopedist. Notes her diet is not very good either. She does not like sweets though she does like salty snacks. She is going to start an exercise challenge to work.  PMH: nonsmoker.   ROS see history of present illness  Objective  Physical Exam Vitals:   06/14/16 1601  BP: 130/88  Pulse: 80  Temp: 99.3 F (37.4 C)    BP Readings from Last 3 Encounters:  06/14/16 130/88  03/02/16 128/90  01/20/16 124/65   Wt Readings from Last 3 Encounters:  06/14/16 191 lb 3.2 oz (86.7 kg)  03/02/16 185 lb 12.8 oz (84.3 kg)  01/18/16 189 lb (85.7 kg)    Physical Exam  Constitutional: No distress.  Cardiovascular: Normal rate, regular rhythm and normal heart sounds.   Pulmonary/Chest: Effort normal and breath sounds normal.  Musculoskeletal:  No midline neck tenderness, no midline neck step-off, no muscular neck tenderness, negative Spurling's bilaterally  Neurological: She is alert.  5/5 strength in bilateral biceps, triceps, grip, quads, hamstrings, plantar and dorsiflexion, sensation to light touch intact in bilateral UE and LE, normal gait  Skin: Skin is warm and dry. She is not diaphoretic.     Assessment/Plan: Please see individual problem list.  Anxiety and depression Essentially stable. She does not want to add  anything to her regimen. She'll continue her current medications. Refill Klonopin. Continue to monitor.  Obesity (BMI 30-39.9) Continues to be an issue. Discussed diet and exercise at length with patient. Advised that she will need to try this for at least 2 months and show true effort prior to considering medication for this. She'll follow-up in 2 months.  Neck pain Patient with somewhat improvement in paresthesias of the left shoulder. Discussed given continued symptoms we should proceed with MRI. We will forward this note to our referral coordinator to get this set up. Patient is given return precautions.   No orders of the defined types were placed in this encounter.   Meds ordered this encounter  Medications  . Multiple Vitamin (MULTIVITAMIN) capsule    Sig: Take 1 capsule by mouth daily.  . clonazePAM (KLONOPIN) 0.5 MG tablet    Sig: Take 1 tablet (0.5 mg total) by mouth 2 (two) times daily as needed for anxiety.    Dispense:  60 tablet    Refill:  0   Marikay Alar, MD [facility]

## 2016-06-14 NOTE — Assessment & Plan Note (Signed)
Continues to be an issue. Discussed diet and exercise at length with patient. Advised that she will need to try this for at least 2 months and show true effort prior to considering medication for this. She'll follow-up in 2 months.

## 2016-06-14 NOTE — Progress Notes (Signed)
Pre visit review using our clinic review tool, if applicable. No additional management support is needed unless otherwise documented below in the visit note. 

## 2016-06-14 NOTE — Patient Instructions (Signed)
Nice to see you. We will get your MRI scheduled. I would like to see back in about 2 months to follow-up on your weight after you worked on your diet and exercise as we discussed.

## 2016-06-14 NOTE — Assessment & Plan Note (Addendum)
Patient with somewhat improvement in paresthesias of the left shoulder. Discussed given continued symptoms we should proceed with MRI. We will forward this note to our referral coordinator to get this set up. Patient is given return precautions.

## 2016-06-29 ENCOUNTER — Ambulatory Visit
Admission: RE | Admit: 2016-06-29 | Discharge: 2016-06-29 | Disposition: A | Discharge status: Home / Self Care | Payer: 59 | Source: Ambulatory Visit | Attending: Family Medicine | Admitting: Family Medicine

## 2016-06-29 DIAGNOSIS — M4802 Spinal stenosis, cervical region: Secondary | ICD-10-CM | POA: Diagnosis not present

## 2016-06-29 DIAGNOSIS — M5031 Other cervical disc degeneration,  high cervical region: Secondary | ICD-10-CM | POA: Diagnosis not present

## 2016-06-29 DIAGNOSIS — M4312 Spondylolisthesis, cervical region: Secondary | ICD-10-CM | POA: Insufficient documentation

## 2016-06-29 DIAGNOSIS — R202 Paresthesia of skin: Secondary | ICD-10-CM | POA: Insufficient documentation

## 2016-06-29 DIAGNOSIS — M1288 Other specific arthropathies, not elsewhere classified, other specified site: Secondary | ICD-10-CM | POA: Insufficient documentation

## 2016-07-04 ENCOUNTER — Encounter: Payer: Self-pay | Admitting: Family Medicine

## 2016-07-04 ENCOUNTER — Ambulatory Visit (INDEPENDENT_AMBULATORY_CARE_PROVIDER_SITE_OTHER): Payer: 59 | Admitting: Family Medicine

## 2016-07-04 VITALS — BP 128/78 | HR 98 | Temp 98.1°F | Wt 190.0 lb

## 2016-07-04 DIAGNOSIS — J020 Streptococcal pharyngitis: Secondary | ICD-10-CM | POA: Diagnosis not present

## 2016-07-04 DIAGNOSIS — J029 Acute pharyngitis, unspecified: Secondary | ICD-10-CM

## 2016-07-04 DIAGNOSIS — M542 Cervicalgia: Secondary | ICD-10-CM

## 2016-07-04 LAB — POCT RAPID STREP A (OFFICE): Rapid Strep A Screen: POSITIVE — AB

## 2016-07-04 MED ORDER — PENICILLIN V POTASSIUM 500 MG PO TABS: 500.0000 mg | ORAL_TABLET | Freq: Two times a day (BID) | ORAL | 0 refills | Status: DC

## 2016-07-04 NOTE — Patient Instructions (Signed)
Nice to see you.  You have strep throat. We will treat you with penicillin. If you develop trouble swallowing, fevers, persistent symptoms, or any new or changing symptoms please seek medical attention.

## 2016-07-04 NOTE — Progress Notes (Signed)
Pre visit review using our clinic review tool, if applicable. No additional management support is needed unless otherwise documented below in the visit note. 

## 2016-07-04 NOTE — Assessment & Plan Note (Signed)
Continues to have some mild paresthesias and neck discomfort. Discussed referral to orthopedics or neurosurgery for consideration of injections versus surgical intervention though she declined this. Advised that she should let us know when she would like to proceed with this. Given return precautions.

## 2016-07-04 NOTE — Progress Notes (Signed)
  Marikay Alar, MD Phone: [phone]  Anne James is a 46 y.o. female who presents today for same day visit.  Patient notes sore throat for the last 2 days. Felt feverish last night and was sweating. Had some chills. Has not checked her temperature. She notes minimal congestion that this is chronic. Minimal cough. Notes her left throat is bothering her the most. She did have some pollen exposure on Friday. No sick contact exposure. She does have a history of strep throat in the past.  Patient underwent MRI last week of her neck given paresthesias. Discussed with her that this revealed advanced facet arthritis and degenerative disc disease. She does occasionally still get tingling in her left arm from her left neck. No numbness or weakness.  PMH: nonsmoker.   ROS see history of present illness  Objective  Physical Exam Vitals:   07/04/16 0949  BP: 128/78  Pulse: 98  Temp: 98.1 F (36.7 C)    BP Readings from Last 3 Encounters:  07/04/16 128/78  06/14/16 130/88  03/02/16 128/90   Wt Readings from Last 3 Encounters:  07/04/16 190 lb (86.2 kg)  06/14/16 191 lb 3.2 oz (86.7 kg)  03/02/16 185 lb 12.8 oz (84.3 kg)    Physical Exam  Constitutional: No distress.  HENT:  Head: Normocephalic and atraumatic.  Normal TMs bilaterally, 1+ tonsils bilaterally with exudate, uvula is midline  Eyes: Conjunctivae are normal. Pupils are equal, round, and reactive to light.  Neck: Neck supple.  No cervical adenopathy though there is mild tenderness over the anterior cervical chain on the left  Cardiovascular: Normal rate, regular rhythm and normal heart sounds.   Pulmonary/Chest: Effort normal and breath sounds normal.  Musculoskeletal: She exhibits no edema.  No midline neck tenderness, no midline neck step-off, there is left-sided trapezius tenderness that is mild on palpation  Neurological: She is alert. Gait normal.  5/5 strength in bilateral biceps, triceps, grip, quads,  hamstrings, plantar and dorsiflexion, sensation to light touch intact in bilateral UE and LE  Skin: She is not diaphoretic.     Assessment/Plan: Please see individual problem list.  Neck pain Continues to have some mild paresthesias and neck discomfort. Discussed referral to orthopedics or neurosurgery for consideration of injections versus surgical intervention though she declined this. Advised that she should let us know when she would like to proceed with this. Given return precautions.  Strep throat Rapid strep test positive. Exam and history consistent with this. We'll treat with penicillin. Tylenol or ibuprofen for any discomfort. Salt water gargles. Given return precautions.   Orders Placed This Encounter  Procedures  . POCT rapid strep A    Meds ordered this encounter  Medications  . penicillin v potassium (VEETID) 500 MG tablet    Sig: Take 1 tablet (500 mg total) by mouth 2 (two) times daily.    Dispense:  20 tablet    Refill:  0    Marikay Alar, MD [facility]

## 2016-07-04 NOTE — Assessment & Plan Note (Signed)
Rapid strep test positive. Exam and history consistent with this. We'll treat with penicillin. Tylenol or ibuprofen for any discomfort. Salt water gargles. Given return precautions.

## 2016-07-06 ENCOUNTER — Telehealth: Payer: Self-pay | Admitting: *Deleted

## 2016-07-06 NOTE — Telephone Encounter (Signed)
Patient has requested to have her work note extended until 07/07/16. Patient is taking her medication as prescribed for strep throat, however she still has terrible sore throat. Patient has a fever spike on 07/06/15 that broke later in the afternoon. She feels that she could return back to work on tomorrow. Please fax not to [employer]. Fax [phone]  Atten: [place] Nurse

## 2016-07-06 NOTE — Telephone Encounter (Signed)
Faxed letter, left patient vm to notify

## 2016-07-13 ENCOUNTER — Encounter: Payer: Self-pay | Admitting: Family Medicine

## 2016-07-13 ENCOUNTER — Other Ambulatory Visit: Payer: Self-pay | Admitting: Family Medicine

## 2016-07-13 MED ORDER — FLUTICASONE PROPIONATE 50 MCG/ACT NA SUSP: 2.0000 | Freq: Every day | NASAL | 6 refills | Status: DC

## 2016-08-03 ENCOUNTER — Other Ambulatory Visit: Payer: Self-pay | Admitting: Family Medicine

## 2016-08-04 ENCOUNTER — Other Ambulatory Visit: Payer: Self-pay

## 2016-08-04 MED ORDER — CLONAZEPAM 0.5 MG PO TABS: 0.5000 mg | ORAL_TABLET | Freq: Two times a day (BID) | ORAL | 0 refills | Status: DC | PRN

## 2016-08-04 NOTE — Telephone Encounter (Signed)
Last OV 06/14/16 last filled by Dr.Walker 06/08/15 30 3rf

## 2016-08-04 NOTE — Telephone Encounter (Signed)
Last Ov 07/04/16 last filled 06/14/16 60 0rf

## 2016-08-05 MED ORDER — FLUOXETINE HCL 40 MG PO CAPS: 40.0000 mg | ORAL_CAPSULE | Freq: Every day | ORAL | 2 refills | Status: DC

## 2016-08-05 NOTE — Telephone Encounter (Signed)
faxed

## 2016-08-15 ENCOUNTER — Ambulatory Visit: Payer: 59 | Admitting: Family Medicine

## 2016-09-02 ENCOUNTER — Other Ambulatory Visit: Payer: Self-pay | Admitting: Family Medicine

## 2016-09-02 ENCOUNTER — Encounter: Payer: Self-pay | Admitting: Family Medicine

## 2016-09-05 ENCOUNTER — Other Ambulatory Visit: Payer: Self-pay | Admitting: Family Medicine

## 2016-09-05 MED ORDER — CELECOXIB 200 MG PO CAPS: 200.0000 mg | ORAL_CAPSULE | Freq: Every day | ORAL | 0 refills | Status: DC

## 2016-09-08 ENCOUNTER — Other Ambulatory Visit: Payer: Self-pay

## 2016-09-08 MED ORDER — CELECOXIB 200 MG PO CAPS: 200.0000 mg | ORAL_CAPSULE | Freq: Every day | ORAL | 0 refills | Status: DC

## 2016-09-08 NOTE — Telephone Encounter (Signed)
Last filled 09/05/16 for 30 days, [facility] requesting 90 day

## 2016-09-12 ENCOUNTER — Ambulatory Visit (INDEPENDENT_AMBULATORY_CARE_PROVIDER_SITE_OTHER): Payer: 59 | Admitting: Family Medicine

## 2016-09-12 ENCOUNTER — Encounter: Payer: Self-pay | Admitting: Family Medicine

## 2016-09-12 VITALS — BP 150/100 | HR 74 | Temp 97.8°F | Wt 197.6 lb

## 2016-09-12 DIAGNOSIS — E559 Vitamin D deficiency, unspecified: Secondary | ICD-10-CM

## 2016-09-12 DIAGNOSIS — R1013 Epigastric pain: Secondary | ICD-10-CM | POA: Diagnosis not present

## 2016-09-12 DIAGNOSIS — R232 Flushing: Secondary | ICD-10-CM

## 2016-09-12 DIAGNOSIS — F329 Major depressive disorder, single episode, unspecified: Secondary | ICD-10-CM

## 2016-09-12 DIAGNOSIS — F419 Anxiety disorder, unspecified: Secondary | ICD-10-CM | POA: Diagnosis not present

## 2016-09-12 LAB — POCT URINE PREGNANCY: Preg Test, Ur: NEGATIVE

## 2016-09-12 NOTE — Assessment & Plan Note (Signed)
Suspect gastric irritation. Would be an atypical presentation for cardiac cause though discussed that females do present atypically at times. EKG was performed and had no ischemic changes. Given normal EKG and atypical presentation doubt cardiac cause. We will obtain lab work as outlined below. She'll take Zantac daily. Discussed trying to minimize use of Celebrex. Given return precautions.

## 2016-09-12 NOTE — Patient Instructions (Addendum)
Nice to see you. I suspect your reflux and indigestion are related to stomach irritation possibly from your Celebrex. You should take Zantac twice daily 30 minutes before meals. I would hold off on taking the Celebrex for now. We'll check some lab work as outlined below. We'll have you return in about a week to recheck your blood pressure. If you develop chest pressure, persistent chest pain, shortness of breath, worsening abdominal discomfort, depression, worsening anxiety, or any new or changing symptoms please seek medical attention.

## 2016-09-12 NOTE — Assessment & Plan Note (Signed)
Patient with intermittent hot flashes over the last several weeks. She does have an endometrial ablation which makes it difficult to tell if she has entered a perimenopausal state. We will [facility]. We will also obtain urine pregnancy test.

## 2016-09-12 NOTE — Assessment & Plan Note (Signed)
Stable. She'll continue her current regimen.

## 2016-09-12 NOTE — Progress Notes (Signed)
  Tommi Rumps, MD Phone: [phone]  Anne James is a 46 y.o. female who presents today for follow-up.  Depression/anxiety: Patient has no depression. Some anxiety. Mostly surrounding work though also her brother is getting divorced and is causing some anxiety. Taking Wellbutrin and Prozac.  She reports no history of elevated blood pressure. She doesn't check her blood pressure. She's not on any medications for this. She noted over the weekend having what felt like heartburn with burning sensation in her chest. She took Zantac and Tums and it did go away. Last night she was awoken with a epigastric stomachache. 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 Notes it has eased up though is still faintly there. She did take Zantac last night as well. No nausea or vomiting. No exertional component. No associated shortness of breath. No chest pressure. Occasionally does note some sweats in the morning when getting ready for work over the last 2 weeks though. She feels as though these are hot flashes. No blood in her stool. Does note a family history of MI in her maternal grandmother though it was at age 64.  PMH: nonsmoker.   ROS see history of present illness  Objective  Physical Exam Vitals:   09/12/16 0800  BP: (!) 150/100  Pulse: 74  Temp: 97.8 F (36.6 C)    BP Readings from Last 3 Encounters:  09/12/16 (!) 150/100  07/04/16 128/78  06/14/16 130/88   Wt Readings from Last 3 Encounters:  09/12/16 197 lb 9.6 oz (89.6 kg)  07/04/16 190 lb (86.2 kg)  06/14/16 191 lb 3.2 oz (86.7 kg)    Physical Exam  Constitutional: No distress.  Cardiovascular: Normal rate, regular rhythm and normal heart sounds.   Pulmonary/Chest: Effort normal and breath sounds normal. No respiratory distress. She has no wheezes. She has no rales.  Abdominal: Soft. Bowel sounds are normal. She exhibits no distension. There is tenderness (mild epigastric tenderness).  There is no rebound and no guarding.  Musculoskeletal: She exhibits no edema.  Neurological: She is alert. Gait normal.  Skin: Skin is warm and dry. She is not diaphoretic.   EKG: Normal sinus rhythm, rate 69, no ST or T-wave changes  Assessment/Plan: Please see individual problem list.  Anxiety and depression Stable. She'll continue her current regimen.  Epigastric pain Suspect gastric irritation. Would be an atypical presentation for cardiac cause though discussed that females do present atypically at times. EKG was performed and had no ischemic changes. Given normal EKG and atypical presentation doubt cardiac cause. We will obtain lab work as outlined below. She'll take Zantac daily. Discussed trying to minimize use of Celebrex. Given return precautions.  Hot flashes Patient with intermittent hot flashes over the last several weeks. She does have an endometrial ablation which makes it difficult to tell if she has entered a perimenopausal state. We will obtain an [facility]. We will also obtain urine pregnancy test.   Orders Placed This Encounter  Procedures  . Comp Met (CMET)  . Lipase  . CBC  . [place]  . Vitamin D (25 hydroxy)  . POCT urine pregnancy  . EKG 12-Lead    Tommi Rumps, MD [place]

## 2016-09-13 LAB — COMPREHENSIVE METABOLIC PANEL
ALK PHOS: 60 IU/L (ref 39–117)
ALT: 14 IU/L (ref 0–32)
AST: 21 IU/L (ref 0–40)
Albumin/Globulin Ratio: 1.6 (ref 1.2–2.2)
Albumin: 4.2 g/dL (ref 3.5–5.5)
BILIRUBIN TOTAL: 0.3 mg/dL (ref 0.0–1.2)
BUN / CREAT RATIO: 20 (ref 9–23)
BUN: 14 mg/dL (ref 6–24)
CO2: 20 mmol/L (ref 20–29)
CREATININE: 0.71 mg/dL (ref 0.57–1.00)
Calcium: 9.4 mg/dL (ref 8.7–10.2)
Chloride: 102 mmol/L (ref 96–106)
GFR calc Af Amer: 116 mL/min/{1.73_m2} (ref >59)
GFR calc non Af Amer: 101 mL/min/{1.73_m2} (ref >59)
GLOBULIN, TOTAL: 2.6 g/dL (ref 1.5–4.5)
GLUCOSE: 85 mg/dL (ref 65–99)
Potassium: 4.8 mmol/L (ref 3.5–5.2)
Sodium: 140 mmol/L (ref 134–144)
Total Protein: 6.8 g/dL (ref 6.0–8.5)

## 2016-09-13 LAB — CBC
HEMATOCRIT: 35 % (ref 34.0–46.6)
Hemoglobin: 10.7 g/dL — ABNORMAL LOW (ref 11.1–15.9)
MCH: 23.7 pg — AB (ref 26.6–33.0)
MCHC: 30.6 g/dL — ABNORMAL LOW (ref 31.5–35.7)
MCV: 77 fL — ABNORMAL LOW (ref 79–97)
Platelets: 515 10*3/uL — ABNORMAL HIGH (ref 150–379)
RBC: 4.52 x10E6/uL (ref 3.77–5.28)
RDW: 17.2 % — AB (ref 12.3–15.4)
WBC: 7.7 10*3/uL (ref 3.4–10.8)

## 2016-09-13 LAB — VITAMIN D 25 HYDROXY (VIT D DEFICIENCY, FRACTURES): Vit D, 25-Hydroxy: 36.6 ng/mL (ref 30.0–100.0)

## 2016-09-13 LAB — FOLLICLE STIMULATING HORMONE: FSH: 5.5 m[IU]/mL

## 2016-09-13 LAB — LIPASE: LIPASE: 34 U/L (ref 14–72)

## 2016-09-15 ENCOUNTER — Other Ambulatory Visit: Payer: Self-pay | Admitting: Family Medicine

## 2016-09-15 DIAGNOSIS — D649 Anemia, unspecified: Secondary | ICD-10-CM

## 2016-09-16 ENCOUNTER — Emergency Department: Payer: 59

## 2016-09-16 ENCOUNTER — Observation Stay
Admission: EM | Admit: 2016-09-16 | Discharge: 2016-09-19 | Disposition: A | Discharge status: Home / Self Care | Payer: 59 | Attending: General Surgery | Admitting: General Surgery

## 2016-09-16 DIAGNOSIS — Z86711 Personal history of pulmonary embolism: Secondary | ICD-10-CM | POA: Diagnosis not present

## 2016-09-16 DIAGNOSIS — I10 Essential (primary) hypertension: Secondary | ICD-10-CM | POA: Diagnosis not present

## 2016-09-16 DIAGNOSIS — R109 Unspecified abdominal pain: Secondary | ICD-10-CM | POA: Diagnosis present

## 2016-09-16 DIAGNOSIS — D509 Iron deficiency anemia, unspecified: Secondary | ICD-10-CM | POA: Diagnosis not present

## 2016-09-16 DIAGNOSIS — M170 Bilateral primary osteoarthritis of knee: Secondary | ICD-10-CM | POA: Insufficient documentation

## 2016-09-16 DIAGNOSIS — K219 Gastro-esophageal reflux disease without esophagitis: Secondary | ICD-10-CM | POA: Diagnosis not present

## 2016-09-16 DIAGNOSIS — M1990 Unspecified osteoarthritis, unspecified site: Secondary | ICD-10-CM | POA: Insufficient documentation

## 2016-09-16 DIAGNOSIS — Z791 Long term (current) use of non-steroidal anti-inflammatories (NSAID): Secondary | ICD-10-CM | POA: Insufficient documentation

## 2016-09-16 DIAGNOSIS — E669 Obesity, unspecified: Secondary | ICD-10-CM | POA: Insufficient documentation

## 2016-09-16 DIAGNOSIS — K8010 Calculus of gallbladder with chronic cholecystitis without obstruction: Secondary | ICD-10-CM | POA: Diagnosis not present

## 2016-09-16 DIAGNOSIS — F329 Major depressive disorder, single episode, unspecified: Secondary | ICD-10-CM | POA: Insufficient documentation

## 2016-09-16 DIAGNOSIS — Z79899 Other long term (current) drug therapy: Secondary | ICD-10-CM | POA: Diagnosis not present

## 2016-09-16 DIAGNOSIS — Z6833 Body mass index (BMI) 33.0-33.9, adult: Secondary | ICD-10-CM | POA: Insufficient documentation

## 2016-09-16 DIAGNOSIS — F419 Anxiety disorder, unspecified: Secondary | ICD-10-CM | POA: Insufficient documentation

## 2016-09-16 DIAGNOSIS — Z9884 Bariatric surgery status: Secondary | ICD-10-CM | POA: Insufficient documentation

## 2016-09-16 DIAGNOSIS — K810 Acute cholecystitis: Secondary | ICD-10-CM

## 2016-09-16 DIAGNOSIS — Z96652 Presence of left artificial knee joint: Secondary | ICD-10-CM | POA: Insufficient documentation

## 2016-09-16 DIAGNOSIS — R1013 Epigastric pain: Secondary | ICD-10-CM

## 2016-09-16 HISTORY — DX: Essential (primary) hypertension: I10

## 2016-09-16 HISTORY — DX: Other specified postprocedural states: Z98.890

## 2016-09-16 LAB — CBC
HCT: 35.7 % (ref 35.0–47.0)
HEMOGLOBIN: 11.2 g/dL — AB (ref 12.0–16.0)
MCH: 24.4 pg — ABNORMAL LOW (ref 26.0–34.0)
MCHC: 31.4 g/dL — AB (ref 32.0–36.0)
MCV: 77.6 fL — ABNORMAL LOW (ref 80.0–100.0)
Platelets: 456 10*3/uL — ABNORMAL HIGH (ref 150–440)
RBC: 4.6 MIL/uL (ref 3.80–5.20)
RDW: 18.4 % — ABNORMAL HIGH (ref 11.5–14.5)
WBC: 9.2 10*3/uL (ref 3.6–11.0)

## 2016-09-16 LAB — COMPREHENSIVE METABOLIC PANEL
ALBUMIN: 4.2 g/dL (ref 3.5–5.0)
ALK PHOS: 60 U/L (ref 38–126)
ALT: 17 U/L (ref 14–54)
ANION GAP: 10 (ref 5–15)
AST: 26 U/L (ref 15–41)
BILIRUBIN TOTAL: 0.4 mg/dL (ref 0.3–1.2)
BUN: 21 mg/dL — ABNORMAL HIGH (ref 6–20)
CALCIUM: 9.4 mg/dL (ref 8.9–10.3)
CO2: 22 mmol/L (ref 22–32)
Chloride: 106 mmol/L (ref 101–111)
Creatinine, Ser: 0.76 mg/dL (ref 0.44–1.00)
GLUCOSE: 89 mg/dL (ref 65–99)
Potassium: 4.3 mmol/L (ref 3.5–5.1)
Sodium: 138 mmol/L (ref 135–145)
TOTAL PROTEIN: 7.4 g/dL (ref 6.5–8.1)

## 2016-09-16 LAB — URINALYSIS, COMPLETE (UACMP) WITH MICROSCOPIC
Bacteria, UA: NONE SEEN
Bilirubin Urine: NEGATIVE
GLUCOSE, UA: NEGATIVE mg/dL
HGB URINE DIPSTICK: NEGATIVE
Ketones, ur: 20 mg/dL — AB
Leukocytes, UA: NEGATIVE
NITRITE: NEGATIVE
PROTEIN: NEGATIVE mg/dL
RBC / HPF: NONE SEEN RBC/hpf (ref 0–5)
SPECIFIC GRAVITY, URINE: 1.021 (ref 1.005–1.030)
pH: 5 (ref 5.0–8.0)

## 2016-09-16 LAB — LIPASE, BLOOD: Lipase: 38 U/L (ref 11–51)

## 2016-09-16 MED ORDER — ONDANSETRON 4 MG PO TBDP
4.0000 mg | ORAL_TABLET | Freq: Once | ORAL | Status: AC | PRN
  Administered 2016-09-16: 4 mg via ORAL
  Filled 2016-09-16: qty 1

## 2016-09-16 MED ORDER — SODIUM CHLORIDE 0.9 % IV BOLUS (SEPSIS)
1000.0000 mL | Freq: Once | INTRAVENOUS | Status: AC
  Administered 2016-09-16: 1000 mL via INTRAVENOUS

## 2016-09-16 MED ORDER — ONDANSETRON HCL 4 MG/2ML IJ SOLN
4.0000 mg | Freq: Once | INTRAMUSCULAR | Status: AC
Start: 2016-09-16 — End: 2016-09-16
  Administered 2016-09-16: 4 mg via INTRAVENOUS

## 2016-09-16 MED ORDER — ONDANSETRON HCL 4 MG/2ML IJ SOLN
4.0000 mg | Freq: Once | INTRAMUSCULAR | Status: AC
  Administered 2016-09-16: 4 mg via INTRAVENOUS
  Filled 2016-09-16: qty 2

## 2016-09-16 MED ORDER — ONDANSETRON HCL 4 MG/2ML IJ SOLN
INTRAMUSCULAR | Status: AC
  Filled 2016-09-16: qty 2

## 2016-09-16 MED ORDER — MORPHINE SULFATE (PF) 4 MG/ML IV SOLN
4.0000 mg | Freq: Once | INTRAVENOUS | Status: AC
  Administered 2016-09-16: 4 mg via INTRAVENOUS
  Filled 2016-09-16: qty 1

## 2016-09-16 MED ORDER — IOPAMIDOL (ISOVUE-300) INJECTION 61%
100.0000 mL | Freq: Once | INTRAVENOUS | Status: AC | PRN
  Administered 2016-09-16: 100 mL via INTRAVENOUS

## 2016-09-16 MED ORDER — IOPAMIDOL (ISOVUE-300) INJECTION 61%
30.0000 mL | Freq: Once | INTRAVENOUS | Status: AC | PRN
  Administered 2016-09-16: 30 mL via ORAL

## 2016-09-16 NOTE — ED Provider Notes (Signed)
Clinical Course as of Sep 18 126  Fri Sep 16, 2016  2322 Assuming care from Dr. Derrill KayGoodman.  In short, Anne James is a 46 y.o. female with a chief complaint of epigastric pain.  Refer to the original H&P for additional details.  The current plan of care is to follow up U/S, reassess, and determine appropriate disposition.   [CF]  Sat Sep 17, 2016  0127 Discussed case in person with Dr. Tonita CongWoodham who will admit for observation and further evaluation.  [CF]    Clinical Course User Index [CF] [name], [name], MD      [name], [name], MD 09/17/16 [phone] [id number]

## 2016-09-16 NOTE — ED Notes (Signed)
Patient transported to CT 

## 2016-09-16 NOTE — ED Notes (Signed)
Patient in ultrasound.

## 2016-09-16 NOTE — ED Notes (Signed)
Patient is a difficult stick.  This nurse attempted twice without success.  Danelle EarthlyNoel, RN to attempt.

## 2016-09-16 NOTE — ED Provider Notes (Addendum)
[facility] Emergency Department Provider Note   ____________________________________________   I have reviewed the triage vital signs and the nursing notes.   HISTORY  Chief Complaint Abdominal Pain (epigastric)  History limited by: Not Limited   HPI Anne James is a 46 y.o. female who presents to the emergency department today because of concerns for abdominal pain. The patient states she started having abdominal pain roughly 1 week ago. She did see her primary care doctor 4 days ago who recommended that she stop her Celebrex and start antacids. Today however around 3:00 the pain came back. It did happen acutely. It is severe. It is located in the epigastric region. Has been accompanied by some nausea. Patient does have a history of gastric bypass.    Past Medical History:  Diagnosis Date  . Anemia    low iron  . Anxiety   . 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 Chicken pox   . Depression   . Eating disorder   . GERD (gastroesophageal reflux disease)   . 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 Phlebitis   . Primary localized osteoarthritis of left knee   . Primary localized osteoarthritis of right knee 01/06/2016  . 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 Epigastric pain 09/12/2016  . Hot flashes 09/12/2016  . Strep throat 07/04/2016  . Status post right knee replacement 03/02/2016  . Primary localized osteoarthritis of right knee 01/06/2016  . Neck pain 11/30/2015  . 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 Primary localized osteoarthritis of left knee   . Anxiety and depression 06/08/2015  . Chronic fatigue 07/02/2014  . Right hip pain 02/23/2012  . Osteoarthritis of both knees 10/17/2011  . Obesity (BMI 30-39.9) 10/17/2011  . History of pulmonary embolus (PE) 03/14/2000    Past  Surgical History:  Procedure Laterality Date  . GASTRIC BYPASS  2002   [facility], Dr. Kennedy Bucker  . NOVASURE ABLATION    . TOTAL KNEE ARTHROPLASTY Left 06/29/2015   Procedure: LEFT TOTAL KNEE ARTHROPLASTY;  Surgeon: Salvatore Marvel, MD;  Location: [facility] OR;  Service: Orthopedics;  Laterality: Left;  . TOTAL KNEE ARTHROPLASTY Right 01/18/2016   Procedure: TOTAL KNEE ARTHROPLASTY;  Surgeon: Salvatore Marvel, MD;  Location: [facility] OR;  Service: Orthopedics;  Laterality: Right;  . TUBAL LIGATION      Prior to Admission medications   Medication Sig Start Date End Date Taking? Authorizing Provider  buPROPion (WELLBUTRIN XL) 150 MG 24 hr tablet Take 1 tablet (150 mg total) by mouth daily. 11/30/15   Glori Luis, MD  celecoxib (CELEBREX) 200 MG capsule Take 1 capsule (200 mg total) by mouth daily. 09/08/16   Glori Luis, MD  Cholecalciferol (VITAMIN D-3) 5000 UNITS TABS Take 1 tablet by mouth daily.    [provider]  clonazePAM (KLONOPIN) 0.5 MG tablet Take 1 tablet (0.5 mg total) by mouth 2 (two) times daily as needed for anxiety. 08/04/16   Glori Luis, MD  cyanocobalamin 500 MCG tablet Take 500 mcg by mouth daily.    [provider]  FLUoxetine (PROZAC) 40 MG capsule Take 1 capsule (40 mg total) by mouth daily. 08/05/16   Glori Luis, MD  fluticasone (FLONASE) 50 MCG/ACT nasal spray Place 2 sprays into both nostrils daily. 07/13/16  [name], Eric G, MD  Multiple Vitamin (MULTIVITAMIN) capsule Take 1 capsule by mouth daily.    [provider]  ranitidine (ZANTAC) 150 MG tablet Take 150 mg by mouth daily as needed for heartburn.    [provider]    Allergies No known allergies  Family History  Problem Relation Age of Onset  . Alcohol abuse Mother   . Drug abuse Mother   . Arthritis Maternal Grandmother   . Heart disease Maternal Grandmother   . Stroke Maternal Grandmother   . Hypertension Maternal Grandmother   . Kidney disease  Maternal Grandmother   . Diabetes Maternal Grandmother   . Anxiety disorder Brother     Social History Social History  Substance Use Topics  . Smoking status: Never Smoker  . Smokeless tobacco: Never Used  . Alcohol use 1.2 oz/week    2 Shots of liquor per week    Review of Systems Constitutional: No fever/chills Eyes: No visual changes. ENT: No sore throat. Cardiovascular: Denies chest pain. Respiratory: Denies shortness of breath. Gastrointestinal: Positive for abdominal pain.  Genitourinary: Negative for dysuria. Musculoskeletal: Negative for back pain. Skin: Negative for rash. Neurological: Negative for headaches, focal weakness or numbness.  ____________________________________________   PHYSICAL EXAM:  VITAL SIGNS: ED Triage Vitals  Enc Vitals Group     BP 09/16/16 1717 (!) 152/95     Pulse Rate 09/16/16 1717 77     Resp 09/16/16 1717 18     Temp 09/16/16 1717 98.2 F (36.8 C)     Temp Source 09/16/16 1717 Oral     SpO2 09/16/16 1717 100 %     Weight 09/16/16 1717 197 lb (89.4 kg)     Height 09/16/16 1717 5\' 5"  (1.651 m)     Head Circumference --      Peak Flow --      Pain Score 09/16/16 1716 7   Constitutional: Alert and oriented. Well appearing and in no distress. Eyes: Conjunctivae are normal.  ENT   Head: Normocephalic and atraumatic.   Nose: No congestion/rhinnorhea.   Mouth/Throat: Mucous membranes are moist.   Neck: No stridor. Hematological/Lymphatic/Immunilogical: No cervical lymphadenopathy. Cardiovascular: Normal rate, regular rhythm.  No murmurs, rubs, or gallops.  Respiratory: Normal respiratory effort without tachypnea nor retractions. Breath sounds are clear and equal bilaterally. No wheezes/rales/rhonchi. Gastrointestinal: Soft and tender to palpation in the epigastric region. Genitourinary: Deferred Musculoskeletal: Normal range of motion in all extremities. No lower extremity edema. Neurologic:  Normal speech and  language. No gross focal neurologic deficits are appreciated.  Skin:  Skin is warm, dry and intact. No rash noted. Psychiatric: Mood and affect are normal. Speech and behavior are normal. Patient exhibits appropriate insight and judgment.  ____________________________________________    LABS (pertinent positives/negatives)  Labs Reviewed  COMPREHENSIVE METABOLIC PANEL - Abnormal; Notable for the following:       Result Value   BUN 21 (*)    All other components within normal limits  CBC - Abnormal; Notable for the following:    Hemoglobin 11.2 (*)    MCV 77.6 (*)    MCH 24.4 (*)    MCHC 31.4 (*)    RDW 18.4 (*)    Platelets 456 (*)    All other components within normal limits  URINALYSIS, COMPLETE (UACMP) WITH MICROSCOPIC - Abnormal; Notable for the following:    Color, Urine YELLOW (*)    APPearance HAZY (*)    Ketones, ur 20 (*)    Squamous Epithelial /  LPF 0-5 (*)    All other components within normal limits  LIPASE, BLOOD     ____________________________________________   EKG  I, Phineas Semen, attending physician, personally viewed and interpreted this EKG  EKG Time: 1726 Rate: 82 Rhythm: normal sinus rhythm Axis: normal Intervals: qtc 432 QRS: narrow ST changes: no st elevation Impression: normal ekg  ____________________________________________    RADIOLOGY  CT abd/pel IMPRESSION: Cholelithiasis with equivocal pericholecystic inflammation. Consider further evaluation with ultrasound or nuclear medicine study if there is strong clinical suspicion for acute cholecystitis.  Relative kink at the terminal ileum with stool in mildly dilated distal ileum, possibly representing a low grade bowel obstruction. No evidence of bowel wall thickening, abscess or pneumoperitoneum.  ____________________________________________   PROCEDURES  Procedures  ____________________________________________   INITIAL IMPRESSION / ASSESSMENT AND PLAN / ED  COURSE  Pertinent labs & imaging results that were available during my care of the patient were reviewed by me and considered in my medical decision making (see chart for details).  Patient presented to the emergency department today because of concerns for abdominal pain. It is located in the epigastric region. On exam patient is tender in the epigastric region. CT scan was performed which is concerning for possible cholelithiasis. Also questioning of some possible small grade bowel obstruction. However given where the patient is tender think this is less likely. I did discuss this finding with the patient. In the event that the patient is discharged did discuss small bowel obstruction return precautions.  Will get ultrasound to evaluate for cholecystitis.  ____________________________________________   FINAL CLINICAL IMPRESSION(S) / ED DIAGNOSES  Final diagnoses:  Epigastric abdominal pain     Note: This dictation was prepared with Dragon dictation. Any transcriptional errors that result from this process are unintentional     Phineas Semen, MD 09/16/16 1610    Phineas Semen, MD 09/16/16 [phone]

## 2016-09-16 NOTE — ED Triage Notes (Signed)
Pt c/o having epigastric pain with N/V Monday and was seen by PCP and was fine until after she ate a salad and baked potato today and has had the same pain with N/V since.

## 2016-09-17 ENCOUNTER — Observation Stay: Payer: 59

## 2016-09-17 ENCOUNTER — Encounter: Payer: Self-pay | Admitting: *Deleted

## 2016-09-17 ENCOUNTER — Other Ambulatory Visit: Payer: Self-pay | Admitting: Radiology

## 2016-09-17 DIAGNOSIS — Z9889 Other specified postprocedural states: Secondary | ICD-10-CM

## 2016-09-17 DIAGNOSIS — R1013 Epigastric pain: Secondary | ICD-10-CM | POA: Diagnosis not present

## 2016-09-17 DIAGNOSIS — R109 Unspecified abdominal pain: Secondary | ICD-10-CM | POA: Diagnosis present

## 2016-09-17 HISTORY — DX: Other specified postprocedural states: Z98.890

## 2016-09-17 LAB — COMPREHENSIVE METABOLIC PANEL
ALT: 14 U/L (ref 14–54)
ANION GAP: 7 (ref 5–15)
AST: 21 U/L (ref 15–41)
Albumin: 3.6 g/dL (ref 3.5–5.0)
Alkaline Phosphatase: 53 U/L (ref 38–126)
BUN: 12 mg/dL (ref 6–20)
CHLORIDE: 106 mmol/L (ref 101–111)
CO2: 26 mmol/L (ref 22–32)
Calcium: 8.5 mg/dL — ABNORMAL LOW (ref 8.9–10.3)
Creatinine, Ser: 0.61 mg/dL (ref 0.44–1.00)
Glucose, Bld: 120 mg/dL — ABNORMAL HIGH (ref 65–99)
POTASSIUM: 3.8 mmol/L (ref 3.5–5.1)
Sodium: 139 mmol/L (ref 135–145)
Total Bilirubin: 0.4 mg/dL (ref 0.3–1.2)
Total Protein: 6.5 g/dL (ref 6.5–8.1)

## 2016-09-17 LAB — CBC
HCT: 30.3 % — ABNORMAL LOW (ref 35.0–47.0)
HEMOGLOBIN: 9.8 g/dL — AB (ref 12.0–16.0)
MCH: 24.9 pg — ABNORMAL LOW (ref 26.0–34.0)
MCHC: 32.4 g/dL (ref 32.0–36.0)
MCV: 76.8 fL — AB (ref 80.0–100.0)
Platelets: 371 10*3/uL (ref 150–440)
RBC: 3.95 MIL/uL (ref 3.80–5.20)
RDW: 18.2 % — ABNORMAL HIGH (ref 11.5–14.5)
WBC: 7.8 10*3/uL (ref 3.6–11.0)

## 2016-09-17 LAB — MRSA PCR SCREENING: MRSA BY PCR: POSITIVE — AB

## 2016-09-17 LAB — PHOSPHORUS: Phosphorus: 3.4 mg/dL (ref 2.5–4.6)

## 2016-09-17 LAB — MAGNESIUM: Magnesium: 1.9 mg/dL (ref 1.7–2.4)

## 2016-09-17 MED ORDER — PANTOPRAZOLE SODIUM 40 MG IV SOLR
40.0000 mg | Freq: Every day | INTRAVENOUS | Status: DC
  Administered 2016-09-17 – 2016-09-18 (×2): 40 mg via INTRAVENOUS
  Filled 2016-09-17 (×2): qty 40

## 2016-09-17 MED ORDER — ONDANSETRON HCL 4 MG/2ML IJ SOLN
4.0000 mg | Freq: Four times a day (QID) | INTRAMUSCULAR | Status: DC | PRN
  Administered 2016-09-17 – 2016-09-18 (×2): 4 mg via INTRAVENOUS
  Filled 2016-09-17 (×2): qty 2

## 2016-09-17 MED ORDER — DIPHENHYDRAMINE HCL 25 MG PO CAPS: 25.0000 mg | ORAL_CAPSULE | Freq: Four times a day (QID) | ORAL | Status: DC | PRN

## 2016-09-17 MED ORDER — MUPIROCIN 2 % EX OINT
1.0000 "application " | TOPICAL_OINTMENT | Freq: Two times a day (BID) | CUTANEOUS | Status: DC
  Administered 2016-09-17 – 2016-09-19 (×5): 1 via NASAL
  Filled 2016-09-17: qty 22

## 2016-09-17 MED ORDER — CHLORHEXIDINE GLUCONATE CLOTH 2 % EX PADS
6.0000 | MEDICATED_PAD | Freq: Every day | CUTANEOUS | Status: DC
  Administered 2016-09-17 – 2016-09-19 (×3): 6 via TOPICAL

## 2016-09-17 MED ORDER — DEXTROSE IN LACTATED RINGERS 5 % IV SOLN
INTRAVENOUS | Status: DC
  Administered 2016-09-17 – 2016-09-18 (×4): via INTRAVENOUS

## 2016-09-17 MED ORDER — TECHNETIUM TC 99M MEBROFENIN IV KIT
5.0000 | PACK | Freq: Once | INTRAVENOUS | Status: AC | PRN
  Administered 2016-09-17: 5.334 via INTRAVENOUS

## 2016-09-17 MED ORDER — MORPHINE SULFATE (PF) 4 MG/ML IV SOLN
4.0000 mg | Freq: Once | INTRAVENOUS | Status: AC
  Administered 2016-09-17: 4 mg via INTRAVENOUS
  Filled 2016-09-17: qty 1

## 2016-09-17 MED ORDER — MORPHINE SULFATE (PF) 4 MG/ML IV SOLN
4.0000 mg | INTRAVENOUS | Status: DC | PRN
  Administered 2016-09-17 – 2016-09-18 (×6): 4 mg via INTRAVENOUS
  Filled 2016-09-17 (×6): qty 1

## 2016-09-17 MED ORDER — HYDRALAZINE HCL 20 MG/ML IJ SOLN: 10.0000 mg | INTRAMUSCULAR | Status: DC | PRN

## 2016-09-17 MED ORDER — ONDANSETRON 4 MG PO TBDP: 4.0000 mg | ORAL_TABLET | Freq: Four times a day (QID) | ORAL | Status: DC | PRN

## 2016-09-17 MED ORDER — DIPHENHYDRAMINE HCL 50 MG/ML IJ SOLN: 25.0000 mg | Freq: Four times a day (QID) | INTRAMUSCULAR | Status: DC | PRN

## 2016-09-17 NOTE — Care Management Obs Status (Signed)
MEDICARE OBSERVATION STATUS NOTIFICATION   Patient Details  Name: Anne James MRN: [id number] Date of Birth: [birth year]   Medicare Observation Status Notification Given:  No [facility]([place] letter )  No Medicare    Avabella Wailes A, RN 09/17/2016, 12:29 PM

## 2016-09-17 NOTE — ED Notes (Signed)
Patient reports pain is returning.  MD notified, order given.

## 2016-09-17 NOTE — ED Notes (Signed)
Surgeon into see patient and family

## 2016-09-17 NOTE — ED Notes (Signed)
Patient returned to room. 

## 2016-09-17 NOTE — ED Notes (Signed)
Pt up to BR with steady gait.

## 2016-09-17 NOTE — Consult Note (Signed)
HCPOA/AD materials dropped off with patient.  CH available to review as needed. 

## 2016-09-17 NOTE — H&P (Signed)
Patient ID: Anne FlavorsLori D Reither, female   DOB: Mar 28, 1970, 46 y.o.   MRN: [id number]  CC: Abdominal pain  HPI Anne James is a 46 y.o. female who presents emergency department for evaluation of abdominal pain. Patient reports that approximately 3:00 yesterday afternoon she had a sudden onset of midepigastric to right upper quadrant abdominal pain. She's had pain similar to this in the past but never this severe. She has had subjective chills, nausea, diarrhea associated since the pain started. She states that the pain medications the ER made it better other than that it would come and go in waves but was relatively constant. She denies any fevers, vomiting, chest pain, shortness of breath, constipation. She denies any recent sick contacts, travels, exotic food intake. She does have a personal history of having a gastric bypass 16 years ago with significant weight loss from that surgery.  HPI  Past Medical History:  Diagnosis Date  . Anemia    low iron  . Anxiety   . 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 Chicken pox   . Depression   . Eating disorder   . GERD (gastroesophageal reflux disease)   . 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 Hypertension   . Phlebitis   . Primary localized osteoarthritis of left knee   . Primary localized osteoarthritis of right knee 01/06/2016  . 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 [facility]  [facility]  . NOVASURE ABLATION    . TOTAL KNEE ARTHROPLASTY Left 06/29/2015   Procedure: LEFT TOTAL KNEE ARTHROPLASTY;  Surgeon: Salvatore Marvelobert Wainer, MD;  [facility];  Service: Orthopedics;  Laterality: Left;  . TOTAL KNEE ARTHROPLASTY Right 01/18/2016   Procedure: TOTAL KNEE ARTHROPLASTY;  Surgeon: Salvatore Marvelobert Wainer, MD;  [facility];  Service: Orthopedics;  Laterality: Right;  . TUBAL LIGATION       Family History  Problem Relation Age of Onset  . Alcohol abuse Mother   . Drug abuse Mother   . Arthritis Maternal Grandmother   . Heart disease Maternal Grandmother   . Stroke Maternal Grandmother   . Hypertension Maternal Grandmother   . Kidney disease Maternal Grandmother   . Diabetes Maternal Grandmother   . Anxiety disorder Brother     Social History Social History  Substance Use Topics  . Smoking status: Never Smoker  . Smokeless tobacco: Never Used  . Alcohol use 1.2 oz/week    2 Shots of liquor per week    Allergies  Allergen Reactions  . No Known Allergies     No current facility-administered medications for this encounter.    Current Outpatient Prescriptions  Medication Sig Dispense Refill  . buPROPion (WELLBUTRIN XL) 150 MG 24 hr tablet Take 1 tablet (150 mg total) by mouth daily. 90 tablet 3  . celecoxib (CELEBREX) 200 MG capsule Take 1 capsule (200 mg total) by mouth daily. 90 capsule 0  . Cholecalciferol (VITAMIN D-3) 5000 UNITS TABS Take 1 tablet by mouth daily.    . clonazePAM (KLONOPIN) 0.5 MG tablet Take 1 tablet (0.5 mg total) by mouth 2 (two) times daily as needed for anxiety. 60 tablet 0  . cyanocobalamin 500 MCG tablet Take 500 mcg by mouth daily.    [name]. FLUoxetine (PROZAC) 40 MG capsule Take 1 capsule (40 mg  total) by mouth daily. 90 capsule 2  . fluticasone (FLONASE) 50 MCG/ACT nasal spray Place 2 sprays into both nostrils daily. 16 g 6  . Multiple Vitamin (MULTIVITAMIN) capsule Take 1 capsule by mouth daily.    . ranitidine (ZANTAC) 150 MG tablet Take 150 mg by mouth daily as needed for heartburn.       Review of Systems A Multi-point review of systems was asked and was negative except for the findings documented in the history of present illness  Physical Exam Blood pressure 134/75, pulse 88, temperature 98.2 F (36.8 C), temperature source Oral, resp. rate 16, height 5\' 5"  (1.651 m), weight 89.4 kg (197 lb), SpO2 98 %. CONSTITUTIONAL:  No acute distress. EYES: Pupils are equal, round, and reactive to light, Sclera are non-icteric. EARS, NOSE, MOUTH AND THROAT: The oropharynx is clear. The oral mucosa is pink and moist. Hearing is intact to voice. LYMPH NODES:  Lymph nodes in the neck are normal. RESPIRATORY:  Lungs are clear. There is normal respiratory effort, with equal breath sounds bilaterally, and without pathologic use of accessory muscles. CARDIOVASCULAR: Heart is regular without murmurs, gallops, or rubs. GI: The abdomen is soft, tender to palpation in the right upper quadrant both the negative Murphy sign, and nondistended. There are no palpable masses. There is no hepatosplenomegaly. There are normal bowel sounds in all quadrants. GU: Rectal deferred.   MUSCULOSKELETAL: Normal muscle strength and tone. No cyanosis or edema.   SKIN: Turgor is good and there are no pathologic skin lesions or ulcers. NEUROLOGIC: Motor and sensation is grossly normal. Cranial nerves are grossly intact. PSYCH:  Oriented to person, place and time. Affect is normal.  Data Reviewed Images and labs reviewed. All of her labs are within normal limits with a normal white blood cell count and normal LFTs. She has a CT scan and an ultrasound of the abdomen from the ER. CT scan shows possible dilatation of the small bowel as well as a distended gallbladder with numerous gallstones. The ultrasound of the right upper quadrant shows a distended gallbladder with multiple gallstones but without ductal dilatation or gallbladder wall thickening. However there is pericholecystic fluid. I have personally reviewed the patient's imaging, laboratory findings and medical records.    Assessment    Abdominal pain    Plan    46 year old female with abdominal pain. Given her gastric bypass status difficult from history and exam to determine definitive source of her pain. It is most likely from her gallbladder but as her history is not 100% classic discussed that  we bring her in the hospital for observation and to obtain a nuclear medicine scan to rule in or out of all blood. It is also possible that she is having pain from a small bowel obstruction from her prior gastric bypass. Further down on the differential would be peptic ulcer disease. Discussed all 3 of these diagnoses with the patient and her husband voiced understanding. Plan to bring the hospital for observation, IV fluids, as he medications, HIDA scan the morning. They agree with this plan.     Time spent with the patient was 50 minutes, with more than 50% of the time spent in face-to-face education, counseling and care coordination.     Ricarda Frame, MD FACS General Surgeon 09/17/2016, 1:48 AM

## 2016-09-17 NOTE — Progress Notes (Signed)
HIDA scan results noted. Pt w severe pain after drinking ensure. She wishes to proceed with cholecystectomy.  D/W the pt in detail about the procedure.  The risks, benefits, complications, treatment options, and expected outcomes were discussed with the patient. The possibilities of bleeding, recurrent infection, finding a normal gallbladder, perforation of viscus organs, damage to surrounding structures, bile leak, abscess formation, needing a drain placed, the need for additional procedures, reaction to medication, pulmonary aspiration,  failure to diagnose a condition, the possible need to convert to an open procedure, and creating a complication requiring transfusion or operation were discussed with the patient. The patient and/or family concurred with the proposed plan, giving informed consent.  We will schedule lap chole in am

## 2016-09-18 ENCOUNTER — Observation Stay: Payer: 59 | Admitting: Registered Nurse

## 2016-09-18 ENCOUNTER — Encounter
Admission: EM | Disposition: A | Discharge status: Home / Self Care | Payer: Self-pay | Source: Home / Self Care | Attending: Emergency Medicine

## 2016-09-18 ENCOUNTER — Encounter: Payer: Self-pay | Admitting: Anesthesiology

## 2016-09-18 DIAGNOSIS — K810 Acute cholecystitis: Secondary | ICD-10-CM

## 2016-09-18 HISTORY — PX: CHOLECYSTECTOMY: SHX55

## 2016-09-18 LAB — COMPREHENSIVE METABOLIC PANEL
ALT: 13 U/L — AB (ref 14–54)
AST: 19 U/L (ref 15–41)
Albumin: 3.2 g/dL — ABNORMAL LOW (ref 3.5–5.0)
Alkaline Phosphatase: 47 U/L (ref 38–126)
Anion gap: 6 (ref 5–15)
BUN: 9 mg/dL (ref 6–20)
CHLORIDE: 105 mmol/L (ref 101–111)
CO2: 29 mmol/L (ref 22–32)
CREATININE: 0.7 mg/dL (ref 0.44–1.00)
Calcium: 8.5 mg/dL — ABNORMAL LOW (ref 8.9–10.3)
GFR calc Af Amer: >60 mL/min (ref >60)
Glucose, Bld: 102 mg/dL — ABNORMAL HIGH (ref 65–99)
Potassium: 3.5 mmol/L (ref 3.5–5.1)
Sodium: 140 mmol/L (ref 135–145)
Total Bilirubin: 0.4 mg/dL (ref 0.3–1.2)
Total Protein: 5.9 g/dL — ABNORMAL LOW (ref 6.5–8.1)

## 2016-09-18 LAB — HIV ANTIBODY (ROUTINE TESTING W REFLEX): HIV SCREEN 4TH GENERATION: NONREACTIVE

## 2016-09-18 SURGERY — LAPAROSCOPIC CHOLECYSTECTOMY WITH INTRAOPERATIVE CHOLANGIOGRAM
Anesthesia: General

## 2016-09-18 MED ORDER — FENTANYL CITRATE (PF) 100 MCG/2ML IJ SOLN
25.0000 ug | INTRAMUSCULAR | Status: DC | PRN
  Administered 2016-09-18 (×2): 25 ug via INTRAVENOUS

## 2016-09-18 MED ORDER — BUPIVACAINE-EPINEPHRINE (PF) 0.25% -1:200000 IJ SOLN
INTRAMUSCULAR | Status: AC
  Filled 2016-09-18: qty 30

## 2016-09-18 MED ORDER — ONDANSETRON HCL 4 MG/2ML IJ SOLN
INTRAMUSCULAR | Status: AC
  Filled 2016-09-18: qty 2

## 2016-09-18 MED ORDER — MIDAZOLAM HCL 2 MG/2ML IJ SOLN
INTRAMUSCULAR | Status: DC | PRN
Start: 2016-09-18 — End: 2016-09-18
  Administered 2016-09-18: 2 mg via INTRAVENOUS

## 2016-09-18 MED ORDER — FENTANYL CITRATE (PF) 100 MCG/2ML IJ SOLN
INTRAMUSCULAR | Status: DC | PRN
  Administered 2016-09-18 (×2): 50 ug via INTRAVENOUS

## 2016-09-18 MED ORDER — ACETAMINOPHEN 10 MG/ML IV SOLN
INTRAVENOUS | Status: AC
  Filled 2016-09-18: qty 100

## 2016-09-18 MED ORDER — EPHEDRINE SULFATE 50 MG/ML IJ SOLN
INTRAMUSCULAR | Status: DC | PRN
  Administered 2016-09-18: 10 mg via INTRAVENOUS

## 2016-09-18 MED ORDER — LACTATED RINGERS IV SOLN
INTRAVENOUS | Status: DC | PRN
  Administered 2016-09-18: 14:00:00 via INTRAVENOUS

## 2016-09-18 MED ORDER — SUGAMMADEX SODIUM 200 MG/2ML IV SOLN
INTRAVENOUS | Status: DC | PRN
  Administered 2016-09-18: 200 mg via INTRAVENOUS

## 2016-09-18 MED ORDER — HYDROCODONE-ACETAMINOPHEN 5-325 MG PO TABS
1.0000 | ORAL_TABLET | ORAL | Status: DC | PRN
  Administered 2016-09-18 – 2016-09-19 (×2): 2 via ORAL
  Filled 2016-09-18 (×2): qty 2

## 2016-09-18 MED ORDER — SUGAMMADEX SODIUM 200 MG/2ML IV SOLN
INTRAVENOUS | Status: AC
  Filled 2016-09-18: qty 2

## 2016-09-18 MED ORDER — DEXTROSE IN LACTATED RINGERS 5 % IV SOLN
INTRAVENOUS | Status: DC
  Administered 2016-09-18: 17:00:00 via INTRAVENOUS

## 2016-09-18 MED ORDER — DEXAMETHASONE SODIUM PHOSPHATE 10 MG/ML IJ SOLN
INTRAMUSCULAR | Status: DC | PRN
  Administered 2016-09-18: 10 mg via INTRAVENOUS

## 2016-09-18 MED ORDER — PHENYLEPHRINE HCL 10 MG/ML IJ SOLN
INTRAMUSCULAR | Status: DC | PRN
  Administered 2016-09-18: 100 ug via INTRAVENOUS

## 2016-09-18 MED ORDER — CEFAZOLIN SODIUM 1 G IJ SOLR
INTRAMUSCULAR | Status: DC | PRN
Start: 2016-09-18 — End: 2016-09-18
  Administered 2016-09-18: 2 g via INTRAMUSCULAR

## 2016-09-18 MED ORDER — FENTANYL CITRATE (PF) 100 MCG/2ML IJ SOLN
INTRAMUSCULAR | Status: AC
  Administered 2016-09-18: 25 ug via INTRAVENOUS
  Filled 2016-09-18: qty 2

## 2016-09-18 MED ORDER — FENTANYL CITRATE (PF) 100 MCG/2ML IJ SOLN
INTRAMUSCULAR | Status: AC
  Filled 2016-09-18: qty 2

## 2016-09-18 MED ORDER — KETOROLAC TROMETHAMINE 30 MG/ML IJ SOLN
INTRAMUSCULAR | Status: DC | PRN
  Administered 2016-09-18: 30 mg via INTRAVENOUS

## 2016-09-18 MED ORDER — EPHEDRINE SULFATE 50 MG/ML IJ SOLN
INTRAMUSCULAR | Status: AC
  Filled 2016-09-18: qty 1

## 2016-09-18 MED ORDER — KETOROLAC TROMETHAMINE 30 MG/ML IJ SOLN
30.0000 mg | Freq: Four times a day (QID) | INTRAMUSCULAR | Status: DC
  Administered 2016-09-18 – 2016-09-19 (×3): 30 mg via INTRAVENOUS
  Filled 2016-09-18 (×3): qty 1

## 2016-09-18 MED ORDER — ROCURONIUM BROMIDE 50 MG/5ML IV SOLN
INTRAVENOUS | Status: AC
  Filled 2016-09-18: qty 1

## 2016-09-18 MED ORDER — ACETAMINOPHEN 10 MG/ML IV SOLN
INTRAVENOUS | Status: DC | PRN
  Administered 2016-09-18: 1000 mg via INTRAVENOUS

## 2016-09-18 MED ORDER — ONDANSETRON HCL 4 MG/2ML IJ SOLN
4.0000 mg | Freq: Once | INTRAMUSCULAR | Status: AC | PRN
  Administered 2016-09-18: 4 mg via INTRAVENOUS

## 2016-09-18 MED ORDER — MIDAZOLAM HCL 2 MG/2ML IJ SOLN
INTRAMUSCULAR | Status: AC
  Filled 2016-09-18: qty 2

## 2016-09-18 MED ORDER — BUPIVACAINE-EPINEPHRINE 0.25% -1:200000 IJ SOLN
INTRAMUSCULAR | Status: DC | PRN
  Administered 2016-09-18: 30 mL

## 2016-09-18 MED ORDER — CEFAZOLIN SODIUM 1 G IJ SOLR
INTRAMUSCULAR | Status: AC
  Filled 2016-09-18: qty 20

## 2016-09-18 MED ORDER — ROCURONIUM BROMIDE 100 MG/10ML IV SOLN
INTRAVENOUS | Status: DC | PRN
  Administered 2016-09-18: 40 mg via INTRAVENOUS
  Administered 2016-09-18: 10 mg via INTRAVENOUS

## 2016-09-18 MED ORDER — LIDOCAINE HCL (PF) 2 % IJ SOLN
INTRAMUSCULAR | Status: AC
  Filled 2016-09-18: qty 2

## 2016-09-18 MED ORDER — LIDOCAINE HCL (CARDIAC) 20 MG/ML IV SOLN
INTRAVENOUS | Status: DC | PRN
  Administered 2016-09-18: 100 mg via INTRAVENOUS

## 2016-09-18 MED ORDER — DEXAMETHASONE SODIUM PHOSPHATE 10 MG/ML IJ SOLN
INTRAMUSCULAR | Status: AC
  Filled 2016-09-18: qty 1

## 2016-09-18 MED ORDER — PROPOFOL 10 MG/ML IV BOLUS
INTRAVENOUS | Status: DC | PRN
  Administered 2016-09-18: 150 mg via INTRAVENOUS

## 2016-09-18 SURGICAL SUPPLY — 46 items
APPLICATOR COTTON TIP 6IN STRL (MISCELLANEOUS) IMPLANT
APPLIER CLIP 5 13 M/L LIGAMAX5 (MISCELLANEOUS) ×3
BLADE SURG 15 STRL LF DISP TIS (BLADE) ×1 IMPLANT
BLADE SURG 15 STRL SS (BLADE) ×2
CANISTER SUCT 1200ML W/VALVE (MISCELLANEOUS) ×3 IMPLANT
CHLORAPREP W/TINT 26ML (MISCELLANEOUS) ×3 IMPLANT
CHOLANGIOGRAM CATH TAUT (CATHETERS) IMPLANT
CLEANER CAUTERY TIP 5X5 PAD (MISCELLANEOUS) ×1 IMPLANT
CLIP APPLIE 5 13 M/L LIGAMAX5 (MISCELLANEOUS) ×1 IMPLANT
DECANTER SPIKE VIAL GLASS SM (MISCELLANEOUS) IMPLANT
DERMABOND ADVANCED (GAUZE/BANDAGES/DRESSINGS) ×2
DERMABOND ADVANCED .7 DNX12 (GAUZE/BANDAGES/DRESSINGS) ×1 IMPLANT
DRAPE C-ARM XRAY 36X54 (DRAPES) IMPLANT
ELECT CAUTERY BLADE 6.4 (BLADE) ×3 IMPLANT
ELECT REM PT RETURN 9FT ADLT (ELECTROSURGICAL) ×3
ELECTRODE REM PT RTRN 9FT ADLT (ELECTROSURGICAL) ×1 IMPLANT
ENDOPOUCH RETRIEVER 10 (MISCELLANEOUS) ×3 IMPLANT
GLOVE BIO SURGEON STRL SZ7 (GLOVE) ×9 IMPLANT
GOWN STRL REUS W/ TWL LRG LVL3 (GOWN DISPOSABLE) ×2 IMPLANT
GOWN STRL REUS W/TWL LRG LVL3 (GOWN DISPOSABLE) ×4
IRRIGATION STRYKERFLOW (MISCELLANEOUS) ×1 IMPLANT
IRRIGATOR STRYKERFLOW (MISCELLANEOUS) ×3
IV CATH ANGIO 12GX3 LT BLUE (NEEDLE) IMPLANT
IV NS 1000ML (IV SOLUTION) ×2
IV NS 1000ML BAXH (IV SOLUTION) ×1 IMPLANT
L-HOOK LAP DISP 36CM (ELECTROSURGICAL) ×3
LHOOK LAP DISP 36CM (ELECTROSURGICAL) ×1 IMPLANT
NEEDLE HYPO 22GX1.5 SAFETY (NEEDLE) ×3 IMPLANT
PACK LAP CHOLECYSTECTOMY (MISCELLANEOUS) ×3 IMPLANT
PAD CLEANER CAUTERY TIP 5X5 (MISCELLANEOUS) ×2
PENCIL ELECTRO HAND CTR (MISCELLANEOUS) ×3 IMPLANT
SCISSORS METZENBAUM CVD 33 (INSTRUMENTS) ×3 IMPLANT
SET WARMING FLUID IRRIGATION (MISCELLANEOUS) IMPLANT
SLEEVE ENDOPATH XCEL 5M (ENDOMECHANICALS) ×6 IMPLANT
SOL ANTI-FOG 6CC FOG-OUT (MISCELLANEOUS) ×1 IMPLANT
SOL FOG-OUT ANTI-FOG 6CC (MISCELLANEOUS) ×2
SPONGE LAP 18X18 5 PK (GAUZE/BANDAGES/DRESSINGS) ×3 IMPLANT
STOPCOCK 4 WAY LG BORE MALE ST (IV SETS) IMPLANT
SUT ETHIBOND 0 MO6 C/R (SUTURE) IMPLANT
SUT MNCRL AB 4-0 PS2 18 (SUTURE) ×3 IMPLANT
SUT VICRYL 0 AB UR-6 (SUTURE) ×6 IMPLANT
SYR 20CC LL (SYRINGE) ×3 IMPLANT
TROCAR XCEL BLUNT TIP 100MML (ENDOMECHANICALS) ×3 IMPLANT
TROCAR XCEL NON-BLD 5MMX100MML (ENDOMECHANICALS) ×3 IMPLANT
TUBING INSUFFLATOR HI FLOW (MISCELLANEOUS) ×3 IMPLANT
WATER STERILE IRR 1000ML POUR (IV SOLUTION) ×3 IMPLANT

## 2016-09-18 NOTE — Anesthesia Post-op Follow-up Note (Cosign Needed)
Anesthesia QCDR form completed.        

## 2016-09-18 NOTE — Anesthesia Procedure Notes (Signed)
Procedure Name: Intubation Date/Time: 09/18/2016 2:22 PM Performed by: Doreen Salvage Pre-anesthesia Checklist: Patient identified, Patient being monitored, Timeout performed, Emergency Drugs available and Suction available Patient Re-evaluated:Patient Re-evaluated prior to inductionOxygen Delivery Method: Circle system utilized Preoxygenation: Pre-oxygenation with 100% oxygen Intubation Type: IV induction Ventilation: Mask ventilation without difficulty Laryngoscope Size: Mac and 3 Grade View: Grade I Tube type: Oral Tube size: 7.0 mm Number of attempts: 1 Airway Equipment and Method: Stylet Placement Confirmation: ETT inserted through vocal cords under direct vision,  positive ETCO2 and breath sounds checked- equal and bilateral Secured at: 21 cm Tube secured with: Tape Dental Injury: Teeth and Oropharynx as per pre-operative assessment

## 2016-09-18 NOTE — Anesthesia Preprocedure Evaluation (Signed)
Anesthesia Evaluation  Patient identified by MRN, date of birth, ID band Patient awake    Reviewed: Allergy & Precautions, H&P , NPO status , Patient's Chart, lab work & pertinent test results  Airway Mallampati: II  TM Distance: >3 FB Neck ROM: Full    Dental no notable dental hx. (+) Teeth Intact, Dental Advisory Given, Dental Advidsory Given   Pulmonary neg pulmonary ROS, PE   Pulmonary exam normal breath sounds clear to auscultation       Cardiovascular hypertension, negative cardio ROS   Rhythm:Regular Rate:Normal     Neuro/Psych Anxiety Depression negative neurological ROS     GI/Hepatic Neg liver ROS, GERD  Medicated and Controlled,  Endo/Other  negative endocrine ROS  Renal/GU negative Renal ROS  negative genitourinary   Musculoskeletal  (+) Arthritis , Osteoarthritis,    Abdominal   Peds negative pediatric ROS (+)  Hematology negative hematology ROS (+) anemia ,   Anesthesia Other Findings   Reproductive/Obstetrics negative OB ROS                             Anesthesia Physical  Anesthesia Plan  ASA: II and emergent  Anesthesia Plan: General   Post-op Pain Management:  Regional for Post-op pain   Induction: Intravenous, Rapid sequence and Cricoid pressure planned  PONV Risk Score and Plan:   Airway Management Planned: Oral ETT  Additional Equipment:   Intra-op Plan:   Post-operative Plan: Extubation in OR  Informed Consent: I have reviewed the patients History and Physical, chart, labs and discussed the procedure including the risks, benefits and alternatives for the proposed anesthesia with the patient or authorized representative who has indicated his/her understanding and acceptance.   Dental advisory given  Plan Discussed with: CRNA and Surgeon  Anesthesia Plan Comments:         Anesthesia Quick Evaluation

## 2016-09-18 NOTE — Transfer of Care (Signed)
Immediate Anesthesia Transfer of Care Note  Patient: Anne James  Procedure(s) Performed: Procedure(s): LAPAROSCOPIC CHOLECYSTECTOMY (N/A)  Patient Location: PACU  Anesthesia Type:General  Level of Consciousness: sedated  Airway & Oxygen Therapy: Patient Spontanous Breathing and Patient connected to face mask oxygen  Post-op Assessment: Report given to RN and Post -op Vital signs reviewed and stable  Post vital signs: Reviewed and stable  Last Vitals:  Vitals:   09/18/16 0435 09/18/16 1524  BP: (!) 127/57 139/60  Pulse: (!) 59 91  Resp: 20 (!) 21  Temp: 36.9 C 37.1 C    Complications: No apparent anesthesia complications

## 2016-09-18 NOTE — Anesthesia Postprocedure Evaluation (Signed)
Anesthesia Post Note  Patient: Anne James  Procedure(s) Performed: Procedure(s) (LRB): LAPAROSCOPIC CHOLECYSTECTOMY (N/A)  Patient location during evaluation: PACU Anesthesia Type: General Level of consciousness: awake and alert and oriented Pain management: pain level controlled Vital Signs Assessment: post-procedure vital signs reviewed and stable Respiratory status: spontaneous breathing Cardiovascular status: blood pressure returned to baseline Anesthetic complications: no     Last Vitals:  Vitals:   09/18/16 1748 09/18/16 2030  BP: 118/69 (!) 111/59  Pulse: (!) 106 89  Resp: 15 20  Temp: 36.9 C 37.1 C    Last Pain:  Vitals:   09/18/16 2030  TempSrc: Oral  PainSc:                  Aleksa Collinsworth

## 2016-09-18 NOTE — OR Nursing (Signed)
Patient c/o nausea med given  

## 2016-09-18 NOTE — Op Note (Signed)
Laparoscopic Cholecystectomy  Pre-operative Diagnosis: chronic Cholecystitis  Post-operative Diagnosis: same  Procedure: lap. cholecystectomy  Surgeon: Sterling Bigiego Pabon, MD FACS  Anesthesia: Gen. with endotracheal tube   Findings: chronic Cholecystitis   Estimated Blood Loss: 5cc         Drains: none         Specimens: Gallbladder           Complications: none   Procedure Details  The patient was seen again in the Holding Room. The benefits, complications, treatment options, and expected outcomes were discussed with the patient. The risks of bleeding, infection, recurrence of symptoms, failure to resolve symptoms, bile duct damage, bile duct leak, retained common bile duct stone, bowel injury, any of which could require further surgery and/or ERCP, stent, or papillotomy were reviewed with the patient. The likelihood of improving the patient's symptoms with return to their baseline status is good.  The patient and/or family concurred with the proposed plan, giving informed consent.  The patient was taken to Operating Room, identified as Madelyn FlavorsLori D Hartel and the procedure verified as Laparoscopic Cholecystectomy.  A Time Out was held and the above information confirmed.  Prior to the induction of general anesthesia, antibiotic prophylaxis was administered. VTE prophylaxis was in place. General endotracheal anesthesia was then administered and tolerated well. After the induction, the abdomen was prepped with Chloraprep and draped in the sterile fashion. The patient was positioned in the supine position.  Cut down technique was used to enter the abdominal cavity and a Hasson trochar was placed after two vicryl stitches were anchored to the fascia. Pneumoperitoneum was then created with CO2 and tolerated well without any adverse changes in the patient's vital signs.  Three 5-mm ports were placed in the right upper quadrant all under direct vision. All skin incisions  were infiltrated with a local  anesthetic agent before making the incision and placing the trocars.   The patient was positioned  in reverse Trendelenburg, tilted slightly to the patient's left.  The gallbladder was identified, the fundus grasped and retracted cephalad. Adhesions were lysed bluntly. The infundibulum was grasped and retracted laterally, exposing the peritoneum overlying the triangle of Calot. This was then divided and exposed in a blunt fashion. An extended critical view of the cystic duct and cystic artery was obtained.  The cystic duct was clearly identified and bluntly dissected.   Artery and duct were double clipped and divided.  The gallbladder was taken from the gallbladder fossa in a retrograde fashion with the electrocautery. The gallbladder was removed and placed in an Endocatch bag. The liver bed was irrigated and inspected. Hemostasis was achieved with the electrocautery. Copious irrigation was utilized and was repeatedly aspirated until clear.  The gallbladder and Endocatch sac were then removed through a port site.    Inspection of the right upper quadrant was performed. No bleeding, bile duct injury or leak, or bowel injury was noted. Pneumoperitoneum was released.  The periumbilical port site was closed with interrumpted 0 Vicryl sutures. 4-0 subcuticular Monocryl was used to close the skin. Dermabond was  applied.  The patient was then extubated and brought to the recovery room in stable condition. Sponge, lap, and needle counts were correct at closure and at the conclusion of the case.               Sterling Bigiego Pabon, MD, FACS

## 2016-09-18 NOTE — Progress Notes (Signed)
Seen and examined. For lap chole today. Questions answered

## 2016-09-19 ENCOUNTER — Telehealth: Payer: Self-pay | Admitting: Family Medicine

## 2016-09-19 ENCOUNTER — Encounter: Payer: Self-pay | Admitting: Surgery

## 2016-09-19 ENCOUNTER — Telehealth: Payer: Self-pay | Admitting: General Practice

## 2016-09-19 MED ORDER — HYDROCODONE-ACETAMINOPHEN 5-325 MG PO TABS: 1.0000 | ORAL_TABLET | ORAL | 0 refills | Status: DC | PRN

## 2016-09-19 NOTE — Telephone Encounter (Signed)
FYI - Pt called and just wanted Dr. Birdie SonsSonnenberg know that she is in the hospital and that she had her gallbladder removed yesterday.

## 2016-09-19 NOTE — Telephone Encounter (Signed)
Please read note.

## 2016-09-19 NOTE — Telephone Encounter (Signed)
Post-op call made to patient at this time. Spoke with patient. Post-op interview questions below.  1. How are you feeling? Patient stated that she was doing well.  2. Is your pain controlled? Yes  3. What are you doing for the pain? Patient stated that she has taken her pain medication only as needed.  4. Are you having any Nausea or Vomiting? No  5. Are you having any Fever or Chills? No  6. Are you having any Constipation or Diarrhea? No  7. Is there any Swelling or Bruising you are concerned about? No  8. Do you have any questions or concerns at this time? Just about her work note. [facility]

## 2016-09-19 NOTE — Progress Notes (Signed)
Discharge  Pt was able to participate with discharge teaching with husband at the bedside. Pt was informed of follow-up appt and prescriptions needing to be filled. Pt was also informed of activity limitations and warning signs to notify the MD. PIV removed and pt was dressed and declined a wheelchair. Pt reports pain 4/10 on pain scale at time of discharge.

## 2016-09-19 NOTE — Telephone Encounter (Signed)
Patient is calling asking if she can have a doctors note to go back to work on July 12th.She had surgery on 09/18/16 she had a laparoscopic cholecystectomy with Dr. Everlene FarrierPabon. Please call patient and advice.

## 2016-09-19 NOTE — Telephone Encounter (Signed)
Noted. I have been following her chart. Thanks.

## 2016-09-19 NOTE — Telephone Encounter (Signed)
fyi

## 2016-09-19 NOTE — Telephone Encounter (Addendum)
Called patient back to answer her question. She stated that she wants to go back to work on Thursday 09/22/2016 after having surgery on 09/18/2016. 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 I told her that she still had a lifting restriction until 10/30/2016 and she stated that she was told by Dr. Everlene FarrierPabon at her discharge. She stated that she had mentioned this to Dr. Everlene FarrierPabon of her returning to work and that he told her to call us so we could type her the letter. Patient also requested for her letter to be faxed at [phone], Attention to: Glade Nurseonya Simpson.

## 2016-09-20 ENCOUNTER — Other Ambulatory Visit: Payer: 59

## 2016-09-21 ENCOUNTER — Ambulatory Visit (INDEPENDENT_AMBULATORY_CARE_PROVIDER_SITE_OTHER): Payer: 59 | Admitting: Surgery

## 2016-09-21 ENCOUNTER — Other Ambulatory Visit
Admission: RE | Admit: 2016-09-21 | Discharge: 2016-09-21 | Disposition: A | Discharge status: Home / Self Care | Payer: 59 | Source: Ambulatory Visit | Attending: Surgery | Admitting: Surgery

## 2016-09-21 ENCOUNTER — Ambulatory Visit
Admission: RE | Admit: 2016-09-21 | Discharge: 2016-09-21 | Disposition: A | Discharge status: Home / Self Care | Payer: 59 | Source: Ambulatory Visit | Attending: Surgery | Admitting: Surgery

## 2016-09-21 ENCOUNTER — Encounter: Payer: Self-pay | Admitting: Surgery

## 2016-09-21 ENCOUNTER — Telehealth: Payer: Self-pay

## 2016-09-21 ENCOUNTER — Telehealth: Payer: Self-pay | Admitting: General Practice

## 2016-09-21 VITALS — BP 162/97 | HR 98 | Temp 98.2°F | Ht 65.0 in | Wt 196.8 lb

## 2016-09-21 DIAGNOSIS — Z9049 Acquired absence of other specified parts of digestive tract: Secondary | ICD-10-CM | POA: Insufficient documentation

## 2016-09-21 DIAGNOSIS — R1011 Right upper quadrant pain: Secondary | ICD-10-CM | POA: Insufficient documentation

## 2016-09-21 DIAGNOSIS — Z09 Encounter for follow-up examination after completed treatment for conditions other than malignant neoplasm: Secondary | ICD-10-CM

## 2016-09-21 DIAGNOSIS — R935 Abnormal findings on diagnostic imaging of other abdominal regions, including retroperitoneum: Secondary | ICD-10-CM | POA: Insufficient documentation

## 2016-09-21 LAB — COMPREHENSIVE METABOLIC PANEL
ALBUMIN: 3.5 g/dL (ref 3.5–5.0)
ALT: 22 U/L (ref 14–54)
ANION GAP: 10 (ref 5–15)
AST: 22 U/L (ref 15–41)
Alkaline Phosphatase: 76 U/L (ref 38–126)
BILIRUBIN TOTAL: 0.7 mg/dL (ref 0.3–1.2)
BUN: 9 mg/dL (ref 6–20)
CHLORIDE: 100 mmol/L — AB (ref 101–111)
CO2: 27 mmol/L (ref 22–32)
Calcium: 8.9 mg/dL (ref 8.9–10.3)
Creatinine, Ser: 0.61 mg/dL (ref 0.44–1.00)
GFR calc Af Amer: >60 mL/min (ref >60)
GFR calc non Af Amer: >60 mL/min (ref >60)
GLUCOSE: 98 mg/dL (ref 65–99)
POTASSIUM: 3.6 mmol/L (ref 3.5–5.1)
SODIUM: 137 mmol/L (ref 135–145)
Total Protein: 7.1 g/dL (ref 6.5–8.1)

## 2016-09-21 LAB — SURGICAL PATHOLOGY

## 2016-09-21 NOTE — Patient Instructions (Addendum)
We will obtain a RUQ abdominal ultrasound and labs today. You will go over to the medical mall for this testing to be completed. I will call you with those results and the plan going forward depending on the results of your testing.  Do not have anything to eat or drink until you have your labs drawn and your Ultrasound completed please.

## 2016-09-21 NOTE — Progress Notes (Signed)
S/p lap chole on 09/18/16 Has some more pain today and nausea Taking some PO, no fevers or chills Path d/w pt  PE NAD Abd: soft, mild incisional tenderness, no peritonitis.  A/P Post op pain, likely constipation vs incisional but can not exclude bile leak. We will order RUQ U/S and labs Recheck in am

## 2016-09-21 NOTE — Telephone Encounter (Signed)
Spoke with Dr. Everlene FarrierPabon in regards to US results from today. He would like to see patient in office with HIDA Scan prior tomorrow. Orders placed.  Call made to Central Scheduling at this time. No answer. Left voicemail asking for return phone call at 0800am in the morning.  Call made to patient at this time on cell and home phone. No answer. Left voicemail asking for return phone call at 0800am in morning 09/22/16.

## 2016-09-21 NOTE — Telephone Encounter (Signed)
Returned phone call to patient. She states that she is having New Right Upper Quadrant pain beginning yesterday. She has had a decrease in her appetite and bowels are moving without difficulty. Denies fever and chills. Patient has been placed on schedule to be seen by Dr. Everlene FarrierPabon today.

## 2016-09-21 NOTE — Telephone Encounter (Signed)
Patient is calling we had a letter sent to her work for her to return tomorrow 09/22/16. She is complaining of having some pain, pain level being a 6. She said with her being still in pain and having to take medication for the pain,and also not having much of an appetite she asked if we could send another letter letting her work she can return on Monday 09/26/16. Fax number is [phone]. Please call patient and advice.

## 2016-09-22 ENCOUNTER — Ambulatory Visit
Admission: RE | Admit: 2016-09-22 | Discharge: 2016-09-22 | Disposition: A | Discharge status: Home / Self Care | Payer: 59 | Source: Ambulatory Visit | Attending: Surgery | Admitting: Surgery

## 2016-09-22 ENCOUNTER — Other Ambulatory Visit: Payer: Self-pay

## 2016-09-22 ENCOUNTER — Encounter: Payer: Self-pay | Admitting: Surgery

## 2016-09-22 ENCOUNTER — Inpatient Hospital Stay
Admission: AD | Admit: 2016-09-22 | Discharge: 2016-09-24 | DRG: 392 | Disposition: A | Discharge status: Home / Self Care | Payer: 59 | Source: Ambulatory Visit | Attending: Surgery | Admitting: Surgery

## 2016-09-22 ENCOUNTER — Inpatient Hospital Stay: Payer: 59

## 2016-09-22 ENCOUNTER — Ambulatory Visit (INDEPENDENT_AMBULATORY_CARE_PROVIDER_SITE_OTHER): Payer: 59 | Admitting: Surgery

## 2016-09-22 VITALS — BP 154/90 | HR 89 | Temp 100.1°F | Ht 65.0 in | Wt 193.6 lb

## 2016-09-22 DIAGNOSIS — Z86711 Personal history of pulmonary embolism: Secondary | ICD-10-CM | POA: Diagnosis not present

## 2016-09-22 DIAGNOSIS — R109 Unspecified abdominal pain: Secondary | ICD-10-CM | POA: Diagnosis present

## 2016-09-22 DIAGNOSIS — Z9884 Bariatric surgery status: Secondary | ICD-10-CM

## 2016-09-22 DIAGNOSIS — Z96652 Presence of left artificial knee joint: Secondary | ICD-10-CM | POA: Diagnosis present

## 2016-09-22 DIAGNOSIS — Z6832 Body mass index (BMI) 32.0-32.9, adult: Secondary | ICD-10-CM

## 2016-09-22 DIAGNOSIS — F329 Major depressive disorder, single episode, unspecified: Secondary | ICD-10-CM | POA: Diagnosis present

## 2016-09-22 DIAGNOSIS — R1011 Right upper quadrant pain: Secondary | ICD-10-CM

## 2016-09-22 DIAGNOSIS — E669 Obesity, unspecified: Secondary | ICD-10-CM | POA: Diagnosis present

## 2016-09-22 DIAGNOSIS — I10 Essential (primary) hypertension: Secondary | ICD-10-CM | POA: Diagnosis present

## 2016-09-22 DIAGNOSIS — F411 Generalized anxiety disorder: Secondary | ICD-10-CM | POA: Diagnosis present

## 2016-09-22 DIAGNOSIS — R1010 Upper abdominal pain, unspecified: Secondary | ICD-10-CM

## 2016-09-22 DIAGNOSIS — K219 Gastro-esophageal reflux disease without esophagitis: Secondary | ICD-10-CM | POA: Diagnosis present

## 2016-09-22 DIAGNOSIS — K529 Noninfective gastroenteritis and colitis, unspecified: Secondary | ICD-10-CM | POA: Diagnosis present

## 2016-09-22 DIAGNOSIS — R079 Chest pain, unspecified: Secondary | ICD-10-CM

## 2016-09-22 LAB — CREATININE, SERUM
CREATININE: 0.43 mg/dL — AB (ref 0.44–1.00)
GFR calc Af Amer: >60 mL/min (ref >60)
GFR calc non Af Amer: >60 mL/min (ref >60)

## 2016-09-22 LAB — CBC
HEMATOCRIT: 31.8 % — AB (ref 35.0–47.0)
HEMOGLOBIN: 10.3 g/dL — AB (ref 12.0–16.0)
MCH: 24.5 pg — AB (ref 26.0–34.0)
MCHC: 32.5 g/dL (ref 32.0–36.0)
MCV: 75.2 fL — ABNORMAL LOW (ref 80.0–100.0)
Platelets: 418 10*3/uL (ref 150–440)
RBC: 4.23 MIL/uL (ref 3.80–5.20)
RDW: 18.1 % — ABNORMAL HIGH (ref 11.5–14.5)
WBC: 9.2 10*3/uL (ref 3.6–11.0)

## 2016-09-22 MED ORDER — IOPAMIDOL (ISOVUE-300) INJECTION 61%
100.0000 mL | Freq: Once | INTRAVENOUS | Status: AC | PRN
  Administered 2016-09-22: 100 mL via INTRAVENOUS

## 2016-09-22 MED ORDER — LACTATED RINGERS IV SOLN
INTRAVENOUS | Status: DC
  Administered 2016-09-22 – 2016-09-23 (×3): via INTRAVENOUS

## 2016-09-22 MED ORDER — KETOROLAC TROMETHAMINE 30 MG/ML IJ SOLN
30.0000 mg | Freq: Four times a day (QID) | INTRAMUSCULAR | Status: DC
  Administered 2016-09-22 – 2016-09-24 (×6): 30 mg via INTRAVENOUS
  Filled 2016-09-22 (×6): qty 1

## 2016-09-22 MED ORDER — DIPHENHYDRAMINE HCL 12.5 MG/5ML PO ELIX
12.5000 mg | ORAL_SOLUTION | Freq: Four times a day (QID) | ORAL | Status: DC | PRN
  Filled 2016-09-22: qty 5

## 2016-09-22 MED ORDER — ONDANSETRON 4 MG PO TBDP
4.0000 mg | ORAL_TABLET | Freq: Four times a day (QID) | ORAL | Status: DC | PRN
  Filled 2016-09-22 (×2): qty 1

## 2016-09-22 MED ORDER — SODIUM CHLORIDE 0.9 % IV BOLUS (SEPSIS)
1000.0000 mL | Freq: Once | INTRAVENOUS | Status: AC
  Administered 2016-09-22: 1000 mL via INTRAVENOUS

## 2016-09-22 MED ORDER — OXYCODONE HCL 5 MG PO TABS
5.0000 mg | ORAL_TABLET | ORAL | Status: DC | PRN
  Administered 2016-09-22 – 2016-09-23 (×4): 10 mg via ORAL
  Filled 2016-09-22 (×4): qty 2

## 2016-09-22 MED ORDER — TECHNETIUM TC 99M MEBROFENIN IV KIT
5.1080 | PACK | Freq: Once | INTRAVENOUS | Status: AC | PRN
  Administered 2016-09-22: 5.108 via INTRAVENOUS

## 2016-09-22 MED ORDER — HEPARIN SODIUM (PORCINE) 5000 UNIT/ML IJ SOLN
5000.0000 [IU] | Freq: Three times a day (TID) | INTRAMUSCULAR | Status: DC
  Administered 2016-09-23 – 2016-09-24 (×2): 5000 [IU] via SUBCUTANEOUS
  Filled 2016-09-22 (×2): qty 1

## 2016-09-22 MED ORDER — IOPAMIDOL (ISOVUE-300) INJECTION 61%: 15.0000 mL | INTRAVENOUS | Status: AC

## 2016-09-22 MED ORDER — ONDANSETRON HCL 4 MG/2ML IJ SOLN
4.0000 mg | Freq: Four times a day (QID) | INTRAMUSCULAR | Status: DC | PRN
  Administered 2016-09-22: 4 mg via INTRAVENOUS
  Filled 2016-09-22: qty 2

## 2016-09-22 MED ORDER — MORPHINE SULFATE (PF) 2 MG/ML IV SOLN
2.0000 mg | INTRAVENOUS | Status: DC | PRN
  Administered 2016-09-22: 2 mg via INTRAVENOUS
  Filled 2016-09-22: qty 1

## 2016-09-22 MED ORDER — ACETAMINOPHEN 500 MG PO TABS
1000.0000 mg | ORAL_TABLET | Freq: Four times a day (QID) | ORAL | Status: DC
  Administered 2016-09-22 – 2016-09-24 (×6): 1000 mg via ORAL
  Filled 2016-09-22 (×6): qty 2

## 2016-09-22 MED ORDER — KETOROLAC TROMETHAMINE 30 MG/ML IJ SOLN: 30.0000 mg | Freq: Four times a day (QID) | INTRAMUSCULAR | Status: DC | PRN

## 2016-09-22 MED ORDER — DIPHENHYDRAMINE HCL 50 MG/ML IJ SOLN: 12.5000 mg | Freq: Four times a day (QID) | INTRAMUSCULAR | Status: DC | PRN

## 2016-09-22 MED ORDER — PIPERACILLIN-TAZOBACTAM 3.375 G IVPB
3.3750 g | Freq: Three times a day (TID) | INTRAVENOUS | Status: DC
  Administered 2016-09-22 – 2016-09-24 (×5): 3.375 g via INTRAVENOUS
  Filled 2016-09-22 (×5): qty 50

## 2016-09-22 MED ORDER — PANTOPRAZOLE SODIUM 40 MG IV SOLR
40.0000 mg | Freq: Two times a day (BID) | INTRAVENOUS | Status: DC
  Administered 2016-09-22 – 2016-09-23 (×3): 40 mg via INTRAVENOUS
  Filled 2016-09-22 (×3): qty 40

## 2016-09-22 NOTE — Progress Notes (Signed)
Outpatient Surgical Follow Up  09/22/2016  Anne James is an 46 y.o. female.   Chief Complaint  Patient presents with  . Follow-up    HIDA Scan    HPI: s/p lap chole POD # 4, Persistent right chest wall pain and right upper quadrant pain with nausea. Some low-grade temperature. Ultrasound showing small collection in the right upper quadrant and HIDA scan showed no evidence of leak.   Past Medical History:  Diagnosis Date  . Anemia    low iron  . Anxiety   . 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 Chicken pox   . Depression   . Eating disorder   . GERD (gastroesophageal reflux disease)   . History of endometrial ablation 09/17/2016   In 2006. No periods since 2006.  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 Hypertension   . Phlebitis   . Primary localized osteoarthritis of left knee   . Primary localized osteoarthritis of right knee 01/06/2016  . Pulmonary embolism ([place])    after gastric bypass  . 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 CHOLECYSTECTOMY N/A 09/18/2016   Procedure: LAPAROSCOPIC CHOLECYSTECTOMY;  Surgeon: Jules Husbands, MD;  Location: [facility];  Service: General;  Laterality: N/A;  . GASTRIC BYPASS  2002   [facility], Dr. Fatima Sanger  . NOVASURE ABLATION    . TOTAL KNEE ARTHROPLASTY Left 06/29/2015   Procedure: LEFT TOTAL KNEE ARTHROPLASTY;  Surgeon: Elsie Saas, MD;  Location: [place];  Service: Orthopedics;  Laterality: Left;  . TOTAL KNEE ARTHROPLASTY Right 01/18/2016   Procedure: TOTAL KNEE ARTHROPLASTY;  Surgeon: Elsie Saas, MD;  Location: [place];  Service: Orthopedics;  Laterality: Right;  . TUBAL LIGATION      Family History  Problem Relation Age of Onset  . Alcohol abuse Mother   . Drug abuse Mother   . Arthritis Maternal Grandmother   . Heart disease Maternal Grandmother   . Stroke Maternal Grandmother   . Hypertension Maternal Grandmother   .  Kidney disease Maternal Grandmother   . Diabetes Maternal Grandmother   . Anxiety disorder Brother     Social History:  reports that she has never smoked. She has never used smokeless tobacco. She reports that she drinks alcohol. She reports that she does not use drugs.  Allergies: No Known Allergies  Medications reviewed.    ROS Full ROS performed and is otherwise negative other than what is stated in HPI   BP (!) 154/90   Pulse 89   Temp 100.1 F (37.8 C) (Oral)   Ht '5\' 5"'$  (1.651 m)   Wt 87.8 kg (193 lb 9.6 oz)   BMI 32.22 kg/m   Physical Exam  Constitutional: She is oriented to person, place, and time and well-developed, well-nourished, and in no distress. No distress.  Neck: No JVD present. No tracheal deviation present.  Cardiovascular: Normal rate and regular rhythm.   Pulmonary/Chest: Effort normal. No stridor. No respiratory distress. She exhibits tenderness.  Abdominal: Soft. She exhibits no mass. There is tenderness. There is rebound. There is no guarding.  Incisions healing well without evidence of infection. Right upper quadrant tenderness without peritonitis  Neurological: She is alert and oriented to person, place, and time. Gait normal. GCS score is 15.  Skin: Skin is warm and dry.  Psychiatric: Mood, memory, affect and judgment normal.       Results  for orders placed or performed during the hospital encounter of 09/21/16 (from the past 48 hour(s))  Comprehensive metabolic panel     Status: Abnormal   Collection Time: 09/21/16  3:47 PM  Result Value Ref Range   Sodium 137 135 - 145 mmol/L   Potassium 3.6 3.5 - 5.1 mmol/L   Chloride 100 (L) 101 - 111 mmol/L   CO2 27 22 - 32 mmol/L   Glucose, Bld 98 65 - 99 mg/dL   BUN 9 6 - 20 mg/dL   Creatinine, Ser 0.61 0.44 - 1.00 mg/dL   Calcium 8.9 8.9 - 10.3 mg/dL   Total Protein 7.1 6.5 - 8.1 g/dL   Albumin 3.5 3.5 - 5.0 g/dL   AST 22 15 - 41 U/L   ALT 22 14 - 54 U/L   Alkaline Phosphatase 76 38 - 126 U/L    Total Bilirubin 0.7 0.3 - 1.2 mg/dL   GFR calc non Af Amer >60 >60 mL/min   GFR calc Af Amer >60 >60 mL/min    Comment: (NOTE) The eGFR has been calculated using the CKD EPI equation. This calculation has not been validated in all clinical situations. eGFR's persistently <60 mL/min signify possible Chronic Kidney Disease.    Anion gap 10 5 - 15   Nm Hepatobiliary Liver Func  Result Date: 09/22/2016 CLINICAL DATA:  Cholecystectomy 09/18/2016, CT RIGHT upper quadrant pain. EXAM: NUCLEAR MEDICINE HEPATOBILIARY IMAGING TECHNIQUE: Sequential images of the abdomen were obtained out to 60 minutes following intravenous administration of radiopharmaceutical. RADIOPHARMACEUTICALS:  5.1 mCi Tc-21m Choletec IV COMPARISON:  None. FINDINGS: Prompt uptake of radiotracer by the liver. Counts are evident within the common bile duct and proximal small bowel by 15 minutes. Imaging performed over 60 minutes. No evidence of extraluminal radiotracer to suggest bile leak. IMPRESSION: 1. No evidence of bile leak. 2. Patent common bile duct. Electronically Signed   By: SSuzy BouchardM.D.   On: 09/22/2016 13:43   UKoreaAbdomen Limited Ruq  Result Date: 09/21/2016 CLINICAL DATA:  Post cholecystectomy 3 days ago, now with recurrent right upper quadrant abdominal pain. EXAM: ULTRASOUND ABDOMEN LIMITED RIGHT UPPER QUADRANT COMPARISON:  CT scan the abdomen pelvis - 09/16/2016 FINDINGS: Gallbladder: Surgically absent. There is an indeterminate approximately 6.4 x 2.1 x 5.4 cm fluid collection within the gallbladder fossa. Common bile duct: Diameter: Borderline enlarged for age measuring 0.6 cm in diameter Liver: Normal sonographic appearance of the liver. No discrete hepatic lesions. No intrahepatic bili duct dilatation. No ascites. IMPRESSION: Post cholecystectomy, now with indeterminate approximately 6.4 cm fluid collection within the gallbladder fossa fluid potentially representative of biloma, abscess versus residual  Surgicel. Clinical correlation is advised. Further evaluation with CT scan of the abdomen and pelvis could be performed as indicated. This was made a call report. Electronically Signed   By: JSandi MariscalM.D.   On: 09/21/2016 16:43    Assessment/Plan: Persistent abdominal pain and right-sided chest wall pain after laparoscopic cholecystectomy. Ultrasound showing a small collection and HIDA scan showing no evidence of a leak. Because she does have significant pain we will admit her to the hospital and perform a CT scan of the abdomen and pelvis to rule out any potential missed bowel injury or abscess. Discussed with Dr. DRosana Hoesin detail. She is not peritoneally and I do not think that she needs emergent surgical revision at this time.  DCaroleen Hamman MD [facility]

## 2016-09-22 NOTE — Care Management (Signed)
Cholecystectomy 09/19/07 at [facility]. Readmitted 07/12 with right chest wall pain and right upper quadrant pain.

## 2016-09-22 NOTE — Telephone Encounter (Signed)
Call made to Central Scheduling at this time. HIDA Scan scheduled today at 12pm. She needs to be NPO for 6 hours prior to exam. We will see patient back in office right after HIDA is completed.  Return call made to patient. All results explained to patient and she was given all instructions. She will go to medical mall at 12pm today and then return to office directly afterwards. She verbalizes understanding.

## 2016-09-22 NOTE — H&P (Signed)
09/22/2016  Anne James is an 46 y.o. female.       Chief Complaint  Patient presents with  . Follow-up    HIDA Scan    HPI: s/p lap chole POD # 4, Persistent right chest wall pain and right upper quadrant pain with nausea. Some low-grade temperature. Ultrasound showing small collection in the right upper quadrant and HIDA scan showed no evidence of leak.       Past Medical History:  Diagnosis Date  . Anemia    low iron  . Anxiety   . 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 Chicken pox   . Depression   . Eating disorder   . GERD (gastroesophageal reflux disease)   . History of endometrial ablation 09/17/2016   In 2006. No periods since 2006.  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 Hypertension   . Phlebitis   . Primary localized osteoarthritis of left knee   . Primary localized osteoarthritis of right knee 01/06/2016  . Pulmonary embolism ([place])    after gastric bypass  . 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 CHOLECYSTECTOMY N/A 09/18/2016   Procedure: LAPAROSCOPIC CHOLECYSTECTOMY;  Surgeon: Jules Husbands, MD;  Location: [facility] ORS;  Service: General;  Laterality: N/A;  . GASTRIC BYPASS  2002   [facility], Dr. Fatima Sanger  . NOVASURE ABLATION    . TOTAL KNEE ARTHROPLASTY Left 06/29/2015   Procedure: LEFT TOTAL KNEE ARTHROPLASTY;  Surgeon: Elsie Saas, MD;  Location: [place];  Service: Orthopedics;  Laterality: Left;  . TOTAL KNEE ARTHROPLASTY Right 01/18/2016   Procedure: TOTAL KNEE ARTHROPLASTY;  Surgeon: Elsie Saas, MD;  Location: [place];  Service: Orthopedics;  Laterality: Right;  . TUBAL LIGATION           Family History  Problem Relation Age of Onset  . Alcohol abuse Mother   . Drug abuse Mother   . Arthritis Maternal Grandmother   . Heart disease Maternal Grandmother   . Stroke Maternal Grandmother   .  Hypertension Maternal Grandmother   . Kidney disease Maternal Grandmother   . Diabetes Maternal Grandmother   . Anxiety disorder Brother     Social History:  reports that she has never smoked. She has never used smokeless tobacco. She reports that she drinks alcohol. She reports that she does not use drugs.  Allergies: No Known Allergies  Medications reviewed.    ROS Full ROS performed and is otherwise negative other than what is stated in HPI   BP (!) 154/90   Pulse 89   Temp 100.1 F (37.8 C) (Oral)   Ht '5\' 5"'$  (1.651 m)   Wt 87.8 kg (193 lb 9.6 oz)   BMI 32.22 kg/m   Physical Exam  Constitutional: She is oriented to person, place, and time and well-developed, well-nourished, and in no distress. No distress.  Neck: No JVD present. No tracheal deviation present.  Cardiovascular: Normal rate and regular rhythm.   Pulmonary/Chest: Effort normal. No stridor. No respiratory distress. She exhibits tenderness.  Abdominal: Soft. She exhibits no mass. There is tenderness. There is rebound. There is no guarding.  Incisions healing well without evidence of infection. Right upper quadrant tenderness without peritonitis  Neurological: She is alert and oriented to person, place, and time. Gait normal. GCS score is 15.  Skin: Skin is warm and dry.  Psychiatric:  Mood, memory, affect and judgment normal.       Lab Results Last 48 Hours        Results for orders placed or performed during the hospital encounter of 09/21/16 (from the past 48 hour(s))  Comprehensive metabolic panel     Status: Abnormal   Collection Time: 09/21/16  3:47 PM  Result Value Ref Range   Sodium 137 135 - 145 mmol/L   Potassium 3.6 3.5 - 5.1 mmol/L   Chloride 100 (L) 101 - 111 mmol/L   CO2 27 22 - 32 mmol/L   Glucose, Bld 98 65 - 99 mg/dL   BUN 9 6 - 20 mg/dL   Creatinine, Ser 0.61 0.44 - 1.00 mg/dL   Calcium 8.9 8.9 - 10.3 mg/dL   Total Protein 7.1 6.5 - 8.1 g/dL   Albumin  3.5 3.5 - 5.0 g/dL   AST 22 15 - 41 U/L   ALT 22 14 - 54 U/L   Alkaline Phosphatase 76 38 - 126 U/L   Total Bilirubin 0.7 0.3 - 1.2 mg/dL   GFR calc non Af Amer >60 >60 mL/min   GFR calc Af Amer >60 >60 mL/min    Comment: (NOTE) The eGFR has been calculated using the CKD EPI equation. This calculation has not been validated in all clinical situations. eGFR's persistently <60 mL/min signify possible Chronic Kidney Disease.    Anion gap 10 5 - 15      Imaging Results (Last 48 hours)  Nm Hepatobiliary Liver Func  Result Date: 09/22/2016 CLINICAL DATA:  Cholecystectomy 09/18/2016, CT RIGHT upper quadrant pain. EXAM: NUCLEAR MEDICINE HEPATOBILIARY IMAGING TECHNIQUE: Sequential images of the abdomen were obtained out to 60 minutes following intravenous administration of radiopharmaceutical. RADIOPHARMACEUTICALS:  5.1 mCi Tc-30m Choletec IV COMPARISON:  None. FINDINGS: Prompt uptake of radiotracer by the liver. Counts are evident within the common bile duct and proximal small bowel by 15 minutes. Imaging performed over 60 minutes. No evidence of extraluminal radiotracer to suggest bile leak. IMPRESSION: 1. No evidence of bile leak. 2. Patent common bile duct. Electronically Signed   By: SSuzy BouchardM.D.   On: 09/22/2016 13:43   UKoreaAbdomen Limited Ruq  Result Date: 09/21/2016 CLINICAL DATA:  Post cholecystectomy 3 days ago, now with recurrent right upper quadrant abdominal pain. EXAM: ULTRASOUND ABDOMEN LIMITED RIGHT UPPER QUADRANT COMPARISON:  CT scan the abdomen pelvis - 09/16/2016 FINDINGS: Gallbladder: Surgically absent. There is an indeterminate approximately 6.4 x 2.1 x 5.4 cm fluid collection within the gallbladder fossa. Common bile duct: Diameter: Borderline enlarged for age measuring 0.6 cm in diameter Liver: Normal sonographic appearance of the liver. No discrete hepatic lesions. No intrahepatic bili duct dilatation. No ascites. IMPRESSION: Post cholecystectomy, now  with indeterminate approximately 6.4 cm fluid collection within the gallbladder fossa fluid potentially representative of biloma, abscess versus residual Surgicel. Clinical correlation is advised. Further evaluation with CT scan of the abdomen and pelvis could be performed as indicated. This was made a call report. Electronically Signed   By: JSandi MariscalM.D.   On: 09/21/2016 16:43     Assessment/Plan: Persistent abdominal pain and right-sided chest wall pain after laparoscopic cholecystectomy. Ultrasound showing a small collection and HIDA scan showing no evidence of a leak. Because she does have significant pain we will admit her to the hospital and perform a CT scan of the abdomen and pelvis to rule out any potential missed bowel injury or abscess. Discussed with Dr. DRosana Hoesin detail. She is not  peritoneally and I do not think that she needs emergent surgical revision at this time.  Caroleen Hamman, MD [facility] General Surgeon

## 2016-09-22 NOTE — Patient Instructions (Signed)
We will send you from our office to the Registration Desk in the Medical Mall for Direct Admission. They will get you registered and take you to your room in the hospital. Please do not eat or drink anything until you speak with your nurse or Surgeon at the hospital.  Directions to Medical Mall: When leaving our office, go right. Go all of the way down to the very end of the hallway. You will have a purple wall in front of you. You will now have a tunnel to the hospital on your left hand side. Go through this tunnel and the elevators will be on your left. Go down to the 1st floor and take a slight left. The very first desk on the right hand side is the registration desk.  

## 2016-09-23 ENCOUNTER — Inpatient Hospital Stay: Payer: 59

## 2016-09-23 LAB — COMPREHENSIVE METABOLIC PANEL
ALBUMIN: 2.6 g/dL — AB (ref 3.5–5.0)
ALT: 13 U/L — AB (ref 14–54)
ANION GAP: 10 (ref 5–15)
AST: 14 U/L — ABNORMAL LOW (ref 15–41)
Alkaline Phosphatase: 60 U/L (ref 38–126)
BUN: 8 mg/dL (ref 6–20)
CHLORIDE: 104 mmol/L (ref 101–111)
CO2: 25 mmol/L (ref 22–32)
Calcium: 8.3 mg/dL — ABNORMAL LOW (ref 8.9–10.3)
Creatinine, Ser: 0.51 mg/dL (ref 0.44–1.00)
GFR calc non Af Amer: >60 mL/min (ref >60)
Glucose, Bld: 75 mg/dL (ref 65–99)
Potassium: 3.3 mmol/L — ABNORMAL LOW (ref 3.5–5.1)
SODIUM: 139 mmol/L (ref 135–145)
Total Bilirubin: 0.9 mg/dL (ref 0.3–1.2)
Total Protein: 5.7 g/dL — ABNORMAL LOW (ref 6.5–8.1)

## 2016-09-23 LAB — CBC
HCT: 28.7 % — ABNORMAL LOW (ref 35.0–47.0)
Hemoglobin: 9.2 g/dL — ABNORMAL LOW (ref 12.0–16.0)
MCH: 24.4 pg — AB (ref 26.0–34.0)
MCHC: 32 g/dL (ref 32.0–36.0)
MCV: 76.3 fL — ABNORMAL LOW (ref 80.0–100.0)
PLATELETS: 372 10*3/uL (ref 150–440)
RBC: 3.76 MIL/uL — AB (ref 3.80–5.20)
RDW: 18.3 % — ABNORMAL HIGH (ref 11.5–14.5)
WBC: 7.4 10*3/uL (ref 3.6–11.0)

## 2016-09-23 MED ORDER — GABAPENTIN 300 MG PO CAPS
300.0000 mg | ORAL_CAPSULE | Freq: Three times a day (TID) | ORAL | Status: DC
  Administered 2016-09-23 – 2016-09-24 (×4): 300 mg via ORAL
  Filled 2016-09-23 (×4): qty 1

## 2016-09-23 MED ORDER — PREMIER PROTEIN SHAKE
11.0000 [oz_av] | Freq: Two times a day (BID) | ORAL | Status: DC
  Administered 2016-09-23 – 2016-09-24 (×3): 11 [oz_av] via ORAL

## 2016-09-23 NOTE — Progress Notes (Signed)
Initial Nutrition Assessment  DOCUMENTATION CODES:   Obesity unspecified  INTERVENTION:   Premier Protein BID, each supplement provides 160kcal and 30g protein.   NUTRITION DIAGNOSIS:   Inadequate oral intake related to acute illness as evidenced by per patient/family report.  GOAL:   Patient will meet greater than or equal to 90% of their needs  MONITOR:   PO intake, Supplement acceptance, Labs, Weight trends  REASON FOR ASSESSMENT:   Malnutrition Screening Tool    ASSESSMENT:   46 y/o female s/p cholecystectomy 09/19/07 at [facility]. Readmitted 07/12 with right chest wall pain and right upper quadrant pain. Pt s/p HIDA scan 7/12 which revealed no leak and colitis    Met with pt in room today. Pt reports poor appetite and oral intake for 1 day pta. Pt reports that her appetite has never really returned back to normal since her cholecystectomy. Pt denies any nausea/vomting but reports that she gets full after eating just small amounts. Pt is s/p gastric bypass (roux-en-y) in 2002. Pt reports that she continues to take her daily vitamins at home and that she eats frequent small meals. Pt reports that she has been slowly gaining weight back; chart confirms wt gain. Pt reports no significant recent wt loss. Pt reports loose stools currently. Pt drinks Premier Protein, RD will order to help pt meet estimated protein needs. Pt advanced to regular diet today. Pt educated on low fat diet recommendations following cholecystectomy. Pt with hypokalemia; monitor and supplement as needed per MD discretion.    Medications reviewed and include: heparin, protonix, zosyn  Labs reviewed: K 3.3(L), Ca 8.3(L) adj. 9.42 wnl, alb 2.6(L) Hgb 9.2(L), Hct 28.7(L)  Nutrition-Focused physical exam completed. Findings are no fat depletion, no muscle depletion, and no edema.   Diet Order:  Diet regular Room service appropriate? Yes; Fluid consistency: Thin  Skin:  Wound (see comment) (closed abdominal  incision)  Last BM:  7/12  Height:   Ht Readings from Last 1 Encounters:  09/22/16 _0  (1.651 m)    Weight:   Wt Readings from Last 1 Encounters:  09/22/16 193 lb 11.2 oz (87.9 kg)    Ideal Body Weight:  56.8 kg  BMI:  Body mass index is 32.23 kg/m.  Estimated Nutritional Needs:   Kcal:  1600-1900kcal/day   Protein:  88-96g/day   Fluid:  >1.6L/day   EDUCATION NEEDS:   Education needs addressed  Koleen Distance MS, RD, LDN Pager #[phone] After Hours Pager: [phone]

## 2016-09-23 NOTE — Progress Notes (Signed)
Pt feels much better,  CT reviewed, some colitis, no complications related to chole. No leak on HIDA Doing much better AVSS WBC nml Creat ok  PE NAd Chest: right sided chest tenderness Abd: soft, NT, incisions c/d/i, no peritonitis  A/p Doing well Clears Add gabapentin Will treat for colitis w A/Bs no surgical intervention DC once condition improves

## 2016-09-24 MED ORDER — CIPROFLOXACIN HCL 500 MG PO TABS: 500.0000 mg | ORAL_TABLET | Freq: Two times a day (BID) | ORAL | 0 refills | Status: AC

## 2016-09-24 MED ORDER — ZOLPIDEM TARTRATE 5 MG PO TABS
5.0000 mg | ORAL_TABLET | Freq: Every evening | ORAL | Status: DC | PRN
  Administered 2016-09-24: 5 mg via ORAL
  Filled 2016-09-24: qty 1

## 2016-09-24 MED ORDER — METRONIDAZOLE 500 MG PO TABS: 500.0000 mg | ORAL_TABLET | Freq: Three times a day (TID) | ORAL | 0 refills | Status: AC

## 2016-09-24 NOTE — Progress Notes (Signed)
SURGICAL PROGRESS NOTE  Hospital Day(s): 2.   Post op day(s):  [name]   Interval History: Patient seen and examined, no acute events or new complaints overnight. Patient reports RUQ/right-sided abdominal pain has near-completely resolved, and she has been ambulating, tolerating her regular diet with +flatus and +BM, denies N/V, fever/chills, CP, or SOB.  Review of Systems:  Constitutional: denies fever, chills  HEENT: denies cough or congestion  Respiratory: denies any shortness of breath  Cardiovascular: denies chest pain or palpitations  Gastrointestinal: abdominal pain, N/V, and bowel function as per interval history Genitourinary: denies burning with urination or urinary frequency Musculoskeletal: denies pain, decreased motor or sensation Integumentary: denies any other rashes or skin discolorations except post-surgical abdominal wounds Neurological: denies HA or vision/hearing changes   Vital signs in last 24 hours: [min-max] current  Temp:  [97.8 F (36.6 C)-98 F (36.7 C)] 97.8 F (36.6 C) (07/14 0440) Pulse Rate:  [64-84] 64 (07/14 0440) Resp:  [16] 16 (07/14 0440) BP: (105-127)/(61) 105/61 (07/14 0440) SpO2:  [93 %-97 %] 93 % (07/14 0440)     Height: 5\' 5"  (165.1 cm) Weight: 193 lb 11.2 oz (87.9 kg) BMI (Calculated): 32.3   Intake/Output this shift:  No intake/output data recorded.   Intake/Output last 2 shifts:  @IOLAST2SHIFTS @   Physical Exam:  Constitutional: alert, cooperative and no distress  HENT: normocephalic without obvious abnormality  Eyes: PERRL, EOM's grossly intact and symmetric  Neuro: CN II - XII grossly intact and symmetric without deficit  Respiratory: breathing non-labored at rest  Cardiovascular: regular rate and sinus rhythm  Gastrointestinal: soft, non-tender, and non-distended  Musculoskeletal: UE and LE FROM, no edema or wounds, motor and sensation grossly intact, NT   Labs:  CBC Latest Ref Rng & Units 09/23/2016 09/22/2016 09/17/2016  WBC  3.6 - 11.0 K/uL 7.4 9.2 7.8  Hemoglobin 12.0 - 16.0 g/dL 1.6(X) 10.3(L) 9.8(L)  Hematocrit 35.0 - 47.0 % 28.7(L) 31.8(L) 30.3(L)  Platelets 150 - 440 K/uL 372 418 371   CMP Latest Ref Rng & Units 09/23/2016 09/22/2016 09/21/2016  Glucose 65 - 99 mg/dL 75 - 98  BUN 6 - 20 mg/dL 8 - 9  Creatinine 0.96 - 1.00 mg/dL 0.45 4.09(W) 1.19  Sodium 135 - 145 mmol/L 139 - 137  Potassium 3.5 - 5.1 mmol/L 3.3(L) - 3.6  Chloride 101 - 111 mmol/L 104 - 100(L)  CO2 22 - 32 mmol/L 25 - 27  Calcium 8.9 - 10.3 mg/dL 8.3(L) - 8.9  Total Protein 6.5 - 8.1 g/dL 1.4(N) - 7.1  Total Bilirubin 0.3 - 1.2 mg/dL 0.9 - 0.7  Alkaline Phos 38 - 126 U/L 60 - 76  AST 15 - 41 U/L 14(L) - 22  ALT 14 - 54 U/L 13(L) - 22   Imaging studies: No new pertinent imaging studies   Assessment/Plan: 46 y.o. female with focal enteritis of the terminal ileum adjacent to gallbladder fossa 6 Days s/p elective laparoscopic cholecystectomy for chronic cholecystitis, complicated by pertinent comorbidities including obesity (BMI currently >32 s/p gastric bypass bariatric surgery), HTN, GERD, osteoarthritis, history of PE following bariatric surgery (2002), generalized anxiety disorder, and major depression disorder.   - continue to gradually advance diet as tolerated  - discharge planning with conversion to course of PO antibiotics for focal enteritis  - medical management of patient's comorbidities  - outpatient surgical follow-up as scheduled  - ambulation encouraged  All of the above findings and recommendations were discussed with the patient, and all of patient's questions were  answered to her expressed satisfaction.  -- Scherrie GerlachJason E. Earlene Plateravis, MD, RPVI [place]: [facility] General [facility] - Partnering for exceptional care. Office: [phone]

## 2016-09-24 NOTE — Discharge Instructions (Signed)
Take medications as prescribed. It is important to take your antibiotics as suggested, even if you are feeling better. Notify your doctor if you should experience abdominal pain, nausea, vomiting, diarrhea, fever and if you should have any questions or concerns.

## 2016-09-24 NOTE — Progress Notes (Signed)
Dr. Logan BoresEvans ordered discharge. Discharge instructions, medications and follow up appointment was reviewed with patient. Questions were encouraged. She verbalized agreement. Will call for wheelchair when she is dressed and ready.

## 2016-09-25 NOTE — Discharge Summary (Signed)
Physician Discharge Summary  Patient ID: Anne James MRN: [id number] DOB/AGE: 1970-11-04 46 y.o.  Admit date: 09/22/2016 Discharge date: 09/25/2016  Admission Diagnoses:  Discharge Diagnoses:  Active Problems:   Abdominal pain   Discharged Condition: good  Hospital Course: 46 year old Female s/p recent laparoscopic cholecystectomy presented to office with persistent RUQ abdominal pain and nausea, for which ultrasound demonstrated small RUQ fluid collection, but HIDA showed no evidence of bile leak. Patient was then referred from the office for a CT and for direct admission with antibiotic therapy. CT demonstrated no abscess, but visualized focal enteritis of the terminal ileum in the RUQ, and patient's symptoms promptly improved with NPO and initiation of antibiotic therapy. Upon resolution of her presenting symptoms, patient's diet was safely able to be advanced, and patient was safely able to be discharged home with outpatient surgical follow-up and prescription to complete course of oral antibiotics.  Consults: None  Significant Diagnostic Studies: radiology: CT scan: focal enteritis involving terminal ileum in the RUQ, no abscess  Treatments: IV hydration and antibiotics: Zosyn  Discharge Exam: Blood pressure 105/61, pulse 64, temperature 97.8 F (36.6 C), temperature source Oral, resp. rate 16, height 5\' 5"  (1.651 m), weight 193 lb 11.2 oz (87.9 kg), SpO2 93 %. General appearance: alert, cooperative and no distress GI: soft, non-tender; bowel sounds normal; no masses,  no organomegaly  Disposition: 01-Home or Self Care   Allergies as of 09/24/2016   No Known Allergies     Medication List    TAKE these medications   buPROPion 150 MG 24 hr tablet Commonly known as:  WELLBUTRIN XL Take 1 tablet (150 mg total) by mouth daily.   celecoxib 200 MG capsule Commonly known as:  CELEBREX Take 1 capsule (200 mg total) by mouth daily.   ciprofloxacin 500 MG tablet Commonly  known as:  CIPRO Take 1 tablet (500 mg total) by mouth 2 (two) times daily.   clonazePAM 0.5 MG tablet Commonly known as:  KLONOPIN Take 1 tablet (0.5 mg total) by mouth 2 (two) times daily as needed for anxiety.   cyanocobalamin 500 MCG tablet Take 500 mcg by mouth daily.   FLUoxetine 40 MG capsule Commonly known as:  PROZAC Take 1 capsule (40 mg total) by mouth daily.   fluticasone 50 MCG/ACT nasal spray Commonly known as:  FLONASE Place 2 sprays into both nostrils daily.   HYDROcodone-acetaminophen 5-325 MG tablet Commonly known as:  NORCO/VICODIN Take 1-2 tablets by mouth every 4 (four) hours as needed for moderate pain.   metroNIDAZOLE 500 MG tablet Commonly known as:  FLAGYL Take 1 tablet (500 mg total) by mouth 3 (three) times daily.   multivitamin capsule Take 1 capsule by mouth daily.   ranitidine 150 MG tablet Commonly known as:  ZANTAC Take 150 mg by mouth daily as needed for heartburn.   Vitamin D-3 5000 units Tabs Take 1 tablet by mouth daily.      Follow-up Information    Pabon, GeorgiaDiego F, MD Follow up.   Specialty:  General Surgery Why:  Outpatient surgical follow-up appointment as previously scheduled. Please call if unsure of date/time or if any questions or concerns. Contact information: [address] [place] [address] [phone]           Signed: Ancil LinseyJason Evan Davis 09/25/2016, 12:45 PM

## 2016-09-26 ENCOUNTER — Other Ambulatory Visit
Admission: RE | Admit: 2016-09-26 | Discharge: 2016-09-26 | Disposition: A | Discharge status: Home / Self Care | Payer: 59 | Source: Ambulatory Visit | Attending: Surgery | Admitting: Surgery

## 2016-09-26 ENCOUNTER — Telehealth: Payer: Self-pay | Admitting: General Practice

## 2016-09-26 DIAGNOSIS — R197 Diarrhea, unspecified: Secondary | ICD-10-CM

## 2016-09-26 DIAGNOSIS — R1011 Right upper quadrant pain: Secondary | ICD-10-CM

## 2016-09-26 LAB — CBC WITH DIFFERENTIAL/PLATELET
BASOS PCT: 0 %
Basophils Absolute: 0 10*3/uL (ref 0–0.1)
EOS ABS: 0.7 10*3/uL (ref 0–0.7)
Eosinophils Relative: 7 %
HEMATOCRIT: 30.3 % — AB (ref 35.0–47.0)
Hemoglobin: 10 g/dL — ABNORMAL LOW (ref 12.0–16.0)
LYMPHS ABS: 1.8 10*3/uL (ref 1.0–3.6)
Lymphocytes Relative: 20 %
MCH: 25 pg — AB (ref 26.0–34.0)
MCHC: 33 g/dL (ref 32.0–36.0)
MCV: 75.7 fL — ABNORMAL LOW (ref 80.0–100.0)
MONO ABS: 0.7 10*3/uL (ref 0.2–0.9)
MONOS PCT: 8 %
Neutro Abs: 5.7 10*3/uL (ref 1.4–6.5)
Neutrophils Relative %: 65 %
Platelets: 622 10*3/uL — ABNORMAL HIGH (ref 150–440)
RBC: 4.01 MIL/uL (ref 3.80–5.20)
RDW: 18.1 % — AB (ref 11.5–14.5)
WBC: 9 10*3/uL (ref 3.6–11.0)

## 2016-09-26 LAB — COMPREHENSIVE METABOLIC PANEL
ALBUMIN: 3.4 g/dL — AB (ref 3.5–5.0)
ALT: 22 U/L (ref 14–54)
ANION GAP: 12 (ref 5–15)
AST: 45 U/L — ABNORMAL HIGH (ref 15–41)
Alkaline Phosphatase: 62 U/L (ref 38–126)
BILIRUBIN TOTAL: 0.4 mg/dL (ref 0.3–1.2)
BUN: 15 mg/dL (ref 6–20)
CALCIUM: 9 mg/dL (ref 8.9–10.3)
CO2: 25 mmol/L (ref 22–32)
Chloride: 100 mmol/L — ABNORMAL LOW (ref 101–111)
Creatinine, Ser: 0.74 mg/dL (ref 0.44–1.00)
GFR calc non Af Amer: >60 mL/min (ref >60)
GLUCOSE: 92 mg/dL (ref 65–99)
POTASSIUM: 4 mmol/L (ref 3.5–5.1)
Sodium: 137 mmol/L (ref 135–145)
TOTAL PROTEIN: 7.1 g/dL (ref 6.5–8.1)

## 2016-09-26 MED ORDER — HYDROCODONE-ACETAMINOPHEN 5-325 MG PO TABS: 1.0000 | ORAL_TABLET | Freq: Four times a day (QID) | ORAL | 0 refills | Status: DC | PRN

## 2016-09-26 NOTE — Telephone Encounter (Signed)
3 hours at work today. Appetite is not back to normal. Diarrhea x 7 times. Pain is much better. Sharp pain in RUQ intermittently with any increase in intra-thoracic pressure. Fatigue is same as when hospitalized.  Spoke with Dr. Tonita CongWoodham and Dr. Aleen CampiPiscoya. He would like to get CBC, CMP, C-Diff on patient today and then decide on plan after we receive these results. He will prescibe a small amount of Norco for patient until we can get results of labs and stool. Medication order placed.  Returned phone call to patient at this time. No answer. Left voicemail for return phone call.

## 2016-09-26 NOTE — Telephone Encounter (Signed)
Patient returned my phone call. I explained that she would need to come to the medical mall for labs/ stool testing as soon as possible. Then I do have her prescription available and signed at front desk. She verbalizes understanding and will make her way to the medical mall for testing to be completed.

## 2016-09-26 NOTE — Telephone Encounter (Signed)
Patient called asking if you would give her a call on her cell phone number [phone]. Patient did mention she is feeling better than the last time we saw her. Please call patient and advice.

## 2016-09-27 ENCOUNTER — Other Ambulatory Visit
Admission: RE | Admit: 2016-09-27 | Discharge: 2016-09-27 | Disposition: A | Discharge status: Home / Self Care | Payer: 59 | Source: Ambulatory Visit | Attending: Surgery | Admitting: Surgery

## 2016-09-27 ENCOUNTER — Telehealth: Payer: Self-pay

## 2016-09-27 LAB — C DIFFICILE QUICK SCREEN W PCR REFLEX
C DIFFICILE (CDIFF) TOXIN: NEGATIVE
C DIFFICLE (CDIFF) ANTIGEN: NEGATIVE
C Diff interpretation: NOT DETECTED

## 2016-09-27 NOTE — Telephone Encounter (Signed)
Labs reviewed with Dr. Tonita CongWoodham. Awaiting results of C-Diff testing currently. If C-Diff is negative, he would like patient to hydrate as much as possible and follow-up on 7/25 with Dr. Everlene FarrierPabon as scheduled. She is to call with any further questions or concerns prior to this appointment.

## 2016-09-27 NOTE — Telephone Encounter (Signed)
Patient has been informed of all results. She was told to increase hydration and follow-up as scheduled with Dr. Everlene FarrierPabon on 7/25. She is to continues taking her antibiotics and call with any concerns prior to this appointment. She verbalizes understanding of this.

## 2016-09-27 NOTE — Telephone Encounter (Signed)
Patient called in requesting that a return to work note be sent to [facility] at this time via fax [phone]. I advised patient if she begins to feel worse and needs a note to take her out of work pending her 7/25 appointment with Dr. Everlene FarrierPabon, she is to let me know. As of right now, patient is requesting to return to work as of yesterday.  Letter was sent via fax, Attn: Mikeyla with positive confirmation at this time.

## 2016-09-30 ENCOUNTER — Telehealth: Payer: Self-pay

## 2016-09-30 NOTE — Telephone Encounter (Signed)
Disability forms came through for this patient.

## 2016-10-01 NOTE — Discharge Summary (Signed)
Physician Discharge Summary  Patient ID: Anne FlavorsLori D Ludwig MRN: [id number] DOB/AGE: [birth year] 46 y.o.  Admit date: 09/16/2016 Discharge date: 10/01/2016  Admission Diagnoses:  Discharge Diagnoses:  Active Problems:   Abdominal pain   Acute cholecystitis   Discharged Condition: good  Hospital Course: 46 year old obese Female (BMI 32 s/p remote history of bariatric surgery) presented to surgical office for post-cholecystectomy follow-up with persistent RUQ abdominal pain, which prompted RUQ abdominal ultrasound that demonstrated 6.4 x 2.1 x 5.4 cm fluid collection within the gallbladder fossa. HIDA, however, did not demonstrate and bile leak. Patient was then admitted directly from the office for CT and IV antibiotics after CT demonstrated focal terminal ileitis adjacent to the gallbladder fossa with no abscess. Patient's pain progressively improved over the next 24 hours and ultimately resolved, while patient tolerated advancement of her diet without difficulty. Discharge planning was accordingly initiated, and patient was safely able to be discharged home with appropriate discharge instructions, prescription for oral antibiotic, and surgical follow-up.  Consults: None  Significant Diagnostic Studies: radiology: CT scan: terminal ileitis adjacent to gallbladder fossa of liver  Treatments: IV hydration and antibiotics: Zosyn  Discharge Exam: Blood pressure 121/61, pulse 70, temperature 97.9 F (36.6 C), temperature source Oral, resp. rate 18, height 5\' 5"  (1.651 m), weight 202 lb 6.4 oz (91.8 kg), last menstrual period 09/17/2004, SpO2 100 %. General appearance: alert, cooperative and no distress GI: abdomen soft and non-distended with surgical incisions well-approximated without erythema or drainage, minimal peri-incisional tenderness to palpation, nearly resolved RUQ abdominal tenderness to deep palpation  Disposition: 01-Home or Self Care   Allergies as of 09/19/2016      Reactions   No  Known Allergies       Medication List    TAKE these medications   buPROPion 150 MG 24 hr tablet Commonly known as:  WELLBUTRIN XL Take 1 tablet (150 mg total) by mouth daily.   celecoxib 200 MG capsule Commonly known as:  CELEBREX Take 1 capsule (200 mg total) by mouth daily.   clonazePAM 0.5 MG tablet Commonly known as:  KLONOPIN Take 1 tablet (0.5 mg total) by mouth 2 (two) times daily as needed for anxiety.   cyanocobalamin 500 MCG tablet Take 500 mcg by mouth daily.   FLUoxetine 40 MG capsule Commonly known as:  PROZAC Take 1 capsule (40 mg total) by mouth daily.   fluticasone 50 MCG/ACT nasal spray Commonly known as:  FLONASE Place 2 sprays into both nostrils daily.   multivitamin capsule Take 1 capsule by mouth daily.   ranitidine 150 MG tablet Commonly known as:  ZANTAC Take 150 mg by mouth daily as needed for heartburn.   Vitamin D-3 5000 units Tabs Take 1 tablet by mouth daily.      Follow-up Information    Leafy RoPabon, Diego F, MD. Go on 10/05/2016.   Specialty:  General Surgery Why:  Wednesday at 2:00pm for hospital follow-up Contact information: [address] [place] [address] [phone]           Signed: Ancil LinseyJason Evan Yahmir Sokolov 10/01/2016, 1:31 PM

## 2016-10-05 ENCOUNTER — Ambulatory Visit (INDEPENDENT_AMBULATORY_CARE_PROVIDER_SITE_OTHER): Payer: 59 | Admitting: Surgery

## 2016-10-05 ENCOUNTER — Encounter: Payer: Self-pay | Admitting: Surgery

## 2016-10-05 VITALS — BP 138/80 | HR 82 | Temp 98.6°F | Ht 65.0 in | Wt 196.4 lb

## 2016-10-05 DIAGNOSIS — Z09 Encounter for follow-up examination after completed treatment for conditions other than malignant neoplasm: Secondary | ICD-10-CM

## 2016-10-05 NOTE — Progress Notes (Signed)
S/p lap chole Needed readmission for colitis and treated with antibiotics W/U showed no complications related to her surgery Feeling much better, taking PO Pain resolved  PE NAD Abd: soft, NT, incisions c/d/i  A/p Doing well No surgical issues F/U prn

## 2016-10-05 NOTE — Patient Instructions (Addendum)
Please continue to take your supplements. Please do not submerge in a tub, hot tub, or pool until incisions are completely sealed.  Use sun block to incision area over the next year if this area will be exposed to sun. This helps decrease scarring.  You may resume your normal activities on 10/30/16. At that time- Listen to your body when lifting, if you have pain when lifting, stop and then try again in a few days. Soreness after doing exercises or activities of daily living is normal as you get back in to your normal routine.  If you develop redness, drainage, or pain at incision sites- call our office immediately and speak with a nurse.  Please call our office with any questions or concerns that you may have.

## 2016-10-06 NOTE — Telephone Encounter (Signed)
Patient's disability form was filled out and faxed. Please collect the $25 fee if it was not done before. Thank you.

## 2016-10-10 NOTE — Telephone Encounter (Signed)
Patient called back and paid 25.00 fee over the phone on 10/10/16.

## 2016-10-24 ENCOUNTER — Other Ambulatory Visit: Payer: Self-pay | Admitting: Family Medicine

## 2016-10-25 MED ORDER — CLONAZEPAM 0.5 MG PO TABS: 0.5000 mg | ORAL_TABLET | Freq: Two times a day (BID) | ORAL | 0 refills | Status: DC | PRN

## 2016-11-24 ENCOUNTER — Encounter: Payer: Self-pay | Admitting: Family Medicine

## 2016-12-14 ENCOUNTER — Telehealth: Payer: Self-pay | Admitting: *Deleted

## 2016-12-14 ENCOUNTER — Ambulatory Visit (INDEPENDENT_AMBULATORY_CARE_PROVIDER_SITE_OTHER): Payer: 59 | Admitting: Family Medicine

## 2016-12-14 ENCOUNTER — Encounter: Payer: Self-pay | Admitting: Family Medicine

## 2016-12-14 VITALS — BP 128/88 | HR 75 | Temp 97.7°F | Resp 16 | Wt 198.5 lb

## 2016-12-14 DIAGNOSIS — F419 Anxiety disorder, unspecified: Secondary | ICD-10-CM

## 2016-12-14 DIAGNOSIS — K810 Acute cholecystitis: Secondary | ICD-10-CM

## 2016-12-14 DIAGNOSIS — Z9884 Bariatric surgery status: Secondary | ICD-10-CM | POA: Diagnosis not present

## 2016-12-14 DIAGNOSIS — D508 Other iron deficiency anemias: Secondary | ICD-10-CM

## 2016-12-14 DIAGNOSIS — E669 Obesity, unspecified: Secondary | ICD-10-CM

## 2016-12-14 MED ORDER — CLONAZEPAM 0.5 MG PO TABS: 0.5000 mg | ORAL_TABLET | Freq: Two times a day (BID) | ORAL | 0 refills | Status: DC | PRN

## 2016-12-14 MED ORDER — BUPROPION HCL ER (XL) 150 MG PO TB24: 150.0000 mg | ORAL_TABLET | Freq: Every day | ORAL | 3 refills | Status: DC

## 2016-12-14 NOTE — Assessment & Plan Note (Signed)
Has had some stressful events in her life recently. Anxiety is relatively stable for her. Discussed she could see a therapist. She'll continue her current medications.

## 2016-12-14 NOTE — Addendum Note (Signed)
Addended by: Warden Fillers on: 12/14/2016 09:02 AM   Modules accepted: Orders

## 2016-12-14 NOTE — Patient Instructions (Signed)
Nice to see you. We'll check some lab work today and contact you with the results. If you would like to see the therapist at your work please set that up for your anxiety. Please work on diet and exercise as we discussed.

## 2016-12-14 NOTE — Progress Notes (Signed)
Marikay Alar, MD Phone: [phone]  Anne James is a 46 y.o. female who presents today for follow-up.  Anxiety: Patient notes no depression. Notes mostly anxiety. Has been dealing with a lot with her husband being out of work and not being able to have knee surgery and then having spinal stenosis. She is taking FMLA for that. She taking Wellbutrin and Prozac. Klonopin when needed. No drowsiness with this.  Obesity: She is status post gastric bypass surgery. She recently had a cholecystectomy and that is affecting how she eats. She eats small amounts of food at a time though does not really watch what she eats. Is trying to drink protein shakes. She's cut on soda. She's trying to walk some. She likely has some iron deficiency related to her gastric bypass surgery. Has been anemic for some time. She does take a multivitamin. No bleeding from anywhere.  Patient status post cholecystectomy for cholecystitis. She had surgery and then was readmitted for pain and had a little bit of infection in her intestines. She was treated with antibiotics and that resolved. Her bowel movements have been more frequent since the cholecystectomy. No abdominal pain. No blood in her stool.  PMH: nonsmoker.   ROS see history of present illness  Objective  Physical Exam Vitals:   12/14/16 0807 12/14/16 0831  BP: (!) 138/92 128/88  Pulse: 75   Resp: 16   Temp: 97.7 F (36.5 C)   SpO2: 98%     BP Readings from Last 3 Encounters:  12/14/16 128/88  10/05/16 138/80  09/24/16 105/61   Wt Readings from Last 3 Encounters:  12/14/16 198 lb 8 oz (90 kg)  10/05/16 196 lb 6.4 oz (89.1 kg)  09/22/16 193 lb 11.2 oz (87.9 kg)    Physical Exam  Constitutional: No distress.  Cardiovascular: Normal rate, regular rhythm and normal heart sounds.   Pulmonary/Chest: Effort normal and breath sounds normal.  Abdominal: Soft. Bowel sounds are normal. She exhibits no distension. There is no tenderness. There is no  rebound and no guarding.  Musculoskeletal: She exhibits no edema.  Neurological: She is alert. Gait normal.  Skin: Skin is warm and dry. She is not diaphoretic.     Assessment/Plan: Please see individual problem list.  Acute cholecystitis Prior episode of acute cholecystitis. Status post cholecystectomy. Has been doing well. She'll monitor her bowel movements. Monitor for abdominal pain.  Obesity (BMI 30-39.9) Discussed diet and exercise with patient. She will increase exercise and work on diet. Follow-up in 3 months.  Anxiety Has had some stressful events in her life recently. Anxiety is relatively stable for her. Discussed she could see a therapist. She'll continue her current medications.  Iron deficiency anemia secondary to inadequate dietary iron intake Suspect related to her history of bariatric surgery. We will recheck labs. Check FOBT as well.  H/O bariatric surgery Has not had lab work done in some time. Labs as outlined below.   Orders Placed This Encounter  Procedures  . Iron, TIBC and Ferritin Panel  . CBC  . Vitamin D (25 hydroxy)  . Vitamin B12  . Vitamin B1  . Folate  . Copper, Serum  . Zinc  . Vitamin K1, Serum    Meds ordered this encounter  Medications  . clonazePAM (KLONOPIN) 0.5 MG tablet    Sig: Take 1 tablet (0.5 mg total) by mouth 2 (two) times daily as needed for anxiety.    Dispense:  60 tablet    Refill:  0  .  buPROPion (WELLBUTRIN XL) 150 MG 24 hr tablet    Sig: Take 1 tablet (150 mg total) by mouth daily.    Dispense:  90 tablet    Refill:  3    Marikay Alar, MD [facility]

## 2016-12-14 NOTE — Assessment & Plan Note (Signed)
Prior episode of acute cholecystitis. Status post cholecystectomy. Has been doing well. She'll monitor her bowel movements. Monitor for abdominal pain.

## 2016-12-14 NOTE — Assessment & Plan Note (Signed)
Suspect related to her history of bariatric surgery. We will recheck labs. Check FOBT as well.

## 2016-12-14 NOTE — Addendum Note (Signed)
Addended by: Warden Fillers on: 12/14/2016 08:58 AM   Modules accepted: Orders

## 2016-12-14 NOTE — Assessment & Plan Note (Signed)
Has not had lab work done in some time. Labs as outlined below.

## 2016-12-14 NOTE — Assessment & Plan Note (Signed)
Discussed diet and exercise with patient. She will increase exercise and work on diet. Follow-up in 3 months.

## 2016-12-14 NOTE — Telephone Encounter (Signed)
Left pt a message & notified her that more test need to be collected. She can go to any [facility] drawing station & I have also mailed her orders to her to go anytime she can.

## 2016-12-15 LAB — IRON,TIBC AND FERRITIN PANEL
FERRITIN: 12 ng/mL — AB (ref 15–150)
IRON: 21 ug/dL — AB (ref 27–159)
Iron Saturation: 6 % — CL (ref 15–55)
Total Iron Binding Capacity: 344 ug/dL (ref 250–450)
UIBC: 323 ug/dL (ref 131–425)

## 2016-12-15 LAB — CBC
Hematocrit: 34.2 % (ref 34.0–46.6)
Hemoglobin: 10.5 g/dL — ABNORMAL LOW (ref 11.1–15.9)
MCH: 24 pg — ABNORMAL LOW (ref 26.6–33.0)
MCHC: 30.7 g/dL — ABNORMAL LOW (ref 31.5–35.7)
MCV: 78 fL — ABNORMAL LOW (ref 79–97)
PLATELETS: 539 10*3/uL — AB (ref 150–379)
RBC: 4.38 x10E6/uL (ref 3.77–5.28)
RDW: 17.9 % — AB (ref 12.3–15.4)
WBC: 5.4 10*3/uL (ref 3.4–10.8)

## 2016-12-19 LAB — VITAMIN B12

## 2016-12-21 LAB — FOLATE

## 2016-12-21 LAB — VITAMIN D 25 HYDROXY (VIT D DEFICIENCY, FRACTURES): Vit D, 25-Hydroxy: 58 ng/mL (ref 30.0–100.0)

## 2016-12-22 ENCOUNTER — Telehealth: Payer: Self-pay

## 2016-12-22 NOTE — Telephone Encounter (Signed)
Left message to return call 

## 2016-12-22 NOTE — Telephone Encounter (Signed)
-----   Message from EricGlori Luis MD sent at 12/22/2016  9:54 AM EDT ----- Please contact the patient to see if she has gone to [facility] to get the labs drawn that were not drawn at her visit. Thanks. Minerva Areola.    ----- Message ----- From: SYSTEM Sent: 12/19/2016  12:05 AM To: Glori Luis, MD

## 2016-12-23 ENCOUNTER — Other Ambulatory Visit: Payer: Self-pay | Admitting: Family Medicine

## 2016-12-23 DIAGNOSIS — R748 Abnormal levels of other serum enzymes: Secondary | ICD-10-CM

## 2016-12-23 MED ORDER — FERROUS SULFATE 325 (65 FE) MG PO TBEC: 325.0000 mg | DELAYED_RELEASE_TABLET | Freq: Two times a day (BID) | ORAL | 3 refills | Status: DC

## 2016-12-23 NOTE — Telephone Encounter (Signed)
Patient will have labs done

## 2017-01-05 ENCOUNTER — Encounter: Payer: Self-pay | Admitting: Family Medicine

## 2017-01-05 ENCOUNTER — Other Ambulatory Visit: Payer: Self-pay | Admitting: Family Medicine

## 2017-01-06 MED ORDER — CELECOXIB 200 MG PO CAPS: 200.0000 mg | ORAL_CAPSULE | Freq: Every day | ORAL | 0 refills | Status: DC

## 2017-01-23 ENCOUNTER — Other Ambulatory Visit: Payer: 59

## 2017-03-27 ENCOUNTER — Ambulatory Visit: Payer: 59 | Admitting: Family Medicine

## 2017-03-27 DIAGNOSIS — Z0289 Encounter for other administrative examinations: Secondary | ICD-10-CM

## 2017-06-13 ENCOUNTER — Encounter: Payer: Self-pay | Admitting: *Deleted

## 2017-06-13 ENCOUNTER — Ambulatory Visit
Admission: EM | Admit: 2017-06-13 | Discharge: 2017-06-13 | Disposition: A | Discharge status: Home / Self Care | Payer: Managed Care, Other (non HMO) | Attending: Family Medicine | Admitting: Family Medicine

## 2017-06-13 DIAGNOSIS — J0100 Acute maxillary sinusitis, unspecified: Secondary | ICD-10-CM | POA: Diagnosis not present

## 2017-06-13 MED ORDER — AMOXICILLIN-POT CLAVULANATE 875-125 MG PO TABS: 1.0000 | ORAL_TABLET | Freq: Two times a day (BID) | ORAL | 0 refills | Status: DC

## 2017-06-13 NOTE — ED Triage Notes (Signed)
Runny nose, headache, ear pain, productive cough, body aches,x1 week.

## 2017-06-13 NOTE — Discharge Instructions (Signed)
Stop amoxicillin.  Start augmentin.  Take care  Dr. Adriana Simasook

## 2017-06-13 NOTE — ED Provider Notes (Signed)
[name]  CSN: [id number] Arrival date & time: 06/13/17  [id number]  History   Chief Complaint Chief Complaint  Patient presents with  . Nasal Congestion  . Headache  . Otalgia  . Cough  . Generalized Body Aches   HPI  [age] year old female presents with the above complaints.  Patient reports she has been sick for the past week.  Patient states that she saw [facility] several days ago.  She was prescribed amoxicillin for an ear infection.  She continues to not feel well.  She reports ear discomfort, runny nose, cough.  She states that she is coughing up discolored sputum and blowing discolored mucus out of her nose.  She reports associated fatigue and body aches.  She states that she generally feels poor.  She has had no real improvement with the amoxicillin.  No known exacerbating factors.  No fever.  No other associated symptoms.  No other complaints.  Past Medical History:  Diagnosis Date  . Anemia    low iron  . Anxiety   . 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 Chicken pox   . Depression   . Eating disorder   . GERD (gastroesophageal reflux disease)   . History of endometrial ablation 09/17/2016   In 2006. No periods since 2006.  Anne James. 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 Hypertension   . Phlebitis   . Primary localized osteoarthritis of left knee   . Primary localized osteoarthritis of right knee 01/06/2016  . 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 H/O bariatric surgery 12/14/2016  . Iron deficiency anemia secondary to inadequate dietary iron intake 12/14/2016  . Acute cholecystitis   . Hot flashes 09/12/2016  . Status post right knee replacement 03/02/2016  . Primary localized osteoarthritis of right knee 01/06/2016  . Neck pain 11/30/2015  . 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 Primary localized  osteoarthritis of left knee   . Anxiety 06/08/2015  . Chronic fatigue 07/02/2014  . Right hip pain 02/23/2012  . Osteoarthritis of both knees 10/17/2011  . Obesity (BMI 30-39.9) 10/17/2011  . History of pulmonary embolus (PE) 03/14/2000    Past Surgical History:  Procedure Laterality Date  . CHOLECYSTECTOMY N/A 09/18/2016   Procedure: LAPAROSCOPIC CHOLECYSTECTOMY;  Surgeon: Leafy RoPabon, Diego F, MD;  Location: [facility] ORS;  Service: General;  Laterality: N/A;  . [facility]  . NOVASURE ABLATION    . TOTAL KNEE ARTHROPLASTY Left 06/29/2015   Procedure: LEFT TOTAL KNEE ARTHROPLASTY;  Surgeon: Salvatore Marvelobert Wainer, MD;  [facility];  Service: Orthopedics;  Laterality: Left;  . TOTAL KNEE ARTHROPLASTY Right 01/18/2016   Procedure: TOTAL KNEE ARTHROPLASTY;  Surgeon: Salvatore Marvelobert Wainer, MD;  [facility];  Service: Orthopedics;  Laterality: Right;  . TUBAL LIGATION      OB History   None      Home Medications    Prior to Admission medications   Medication Sig Start Date End Date Taking? Authorizing Provider  celecoxib (CELEBREX) 200 MG capsule Take 1 capsule (200 mg total) by mouth daily. 01/06/17  Yes [name], Eric G, MD  clonazePAM (KLONOPIN) 0.5 MG tablet Take 1 tablet (0.5 mg total) by mouth 2 (two) times daily as needed for anxiety. 12/14/16  Yes Marikay AlarSonnenberg, Eric  G, MD  cyanocobalamin 500 MCG tablet Take 500 mcg by mouth daily.   Yes [provider]  ferrous sulfate 325 (65 FE) MG EC tablet Take 1 tablet (325 mg total) by mouth 2 (two) times daily with a meal. 12/23/16  Yes Glori Luis, MD  FLUoxetine (PROZAC) 40 MG capsule Take 1 capsule (40 mg total) by mouth daily. 08/05/16  Yes Glori Luis, MD  Multiple Vitamin (MULTIVITAMIN) capsule Take 1 capsule by mouth daily.   Yes [provider]  ranitidine (ZANTAC) 150 MG tablet Take 150 mg by mouth daily as needed for heartburn.   Yes [provider]    amoxicillin-clavulanate (AUGMENTIN) 875-125 MG tablet Take 1 tablet by mouth every 12 (twelve) hours. 06/13/17   [name], DO  buPROPion (WELLBUTRIN XL) 150 MG 24 hr tablet Take 1 tablet (150 mg total) by mouth daily. 12/14/16   Glori Luis, MD  Cholecalciferol (VITAMIN D-3) 5000 UNITS TABS Take 1 tablet by mouth daily.    [provider]  fluticasone (FLONASE) 50 MCG/ACT nasal spray Place 2 sprays into both nostrils daily. 07/13/16   Glori Luis, MD    Family History Family History  Problem Relation Age of Onset  . Alcohol abuse Mother   . Drug abuse Mother   . CAD Mother   . Arthritis Maternal Grandmother   . Heart disease Maternal Grandmother   . Stroke Maternal Grandmother   . Hypertension Maternal Grandmother   . Kidney disease Maternal Grandmother   . Diabetes Maternal Grandmother   . Anxiety disorder Brother   . Hypertension Father     Social History Social History   Tobacco Use  . Smoking status: Never Smoker  . Smokeless tobacco: Never Used  Substance Use Topics  . Alcohol use: Yes  . Drug use: No     Allergies   Patient has no known allergies.   Review of Systems Review of Systems  Constitutional: Positive for fatigue. Negative for fever.  HENT: Positive for ear pain and rhinorrhea.   Respiratory: Positive for cough.    Physical Exam Triage Vital Signs ED Triage Vitals  Enc Vitals Group     BP 06/13/17 0850 (!) 144/77     Pulse Rate 06/13/17 0850 67     Resp 06/13/17 0850 16     Temp 06/13/17 0850 97.6 F (36.4 C)     Temp Source 06/13/17 0850 Oral     SpO2 06/13/17 0850 100 %     Weight 06/13/17 0853 207 lb (93.9 kg)     Height 06/13/17 0853 5\' 5"  (1.651 m)     Head Circumference --      Peak Flow --      Pain Score 06/13/17 0852 0     Pain Loc --      Pain Edu? --      Excl. in GC? --    Updated Vital Signs BP (!) 144/77 (BP Location: Left Arm)   Pulse 67   Temp 97.6 F (36.4 C) (Oral)   Resp 16   Ht 5\' 5"   (1.651 m)   Wt 207 lb (93.9 kg)   SpO2 100%   BMI 34.45 kg/m   Physical Exam  Constitutional: She is oriented to person, place, and time. She appears well-developed. No distress.  HENT:  Head: Normocephalic and atraumatic.  Mouth/Throat: Oropharynx is clear and moist.  TM's normal bilaterally. Maxillary sinus tenderness to palpation, left greater than right.  Neck:  Neck supple.  Cardiovascular: Normal rate and regular rhythm.  Pulmonary/Chest: Effort normal and breath sounds normal. She has no wheezes. She has no rales.  Lymphadenopathy:    She has no cervical adenopathy.  Neurological: She is alert and oriented to person, place, and time.  Psychiatric: She has a normal mood and affect. Her behavior is normal.  Nursing note and vitals reviewed.  UC Treatments / Results  Labs (all labs ordered are listed, but only abnormal results are displayed) Labs Reviewed - No data to display  EKG None Radiology No results found.  Procedures Procedures (including critical care time)  Medications Ordered in UC Medications - No data to display   Initial Impression / Assessment and Plan / UC Course  I have reviewed the triage vital signs and the nursing notes.  Pertinent labs & imaging results that were available during my care of the patient were reviewed by me and considered in my medical decision making (see chart for details).     47 year old female presents with sinusitis.  Stopping amoxicillin.  Starting Augmentin.  Final Clinical Impressions(s) / UC Diagnoses   Final diagnoses:  Acute non-recurrent maxillary sinusitis    ED Discharge Orders        Ordered    amoxicillin-clavulanate (AUGMENTIN) 875-125 MG tablet  Every 12 hours     06/13/17 0925     Controlled Substance Prescriptions [place] Controlled Substance Registry consulted? Not Applicable   [name], DO 06/13/17 1003

## 2017-09-20 ENCOUNTER — Encounter: Payer: Self-pay | Admitting: Family Medicine

## 2017-09-20 DIAGNOSIS — R748 Abnormal levels of other serum enzymes: Secondary | ICD-10-CM

## 2017-09-20 DIAGNOSIS — D509 Iron deficiency anemia, unspecified: Secondary | ICD-10-CM

## 2017-09-29 NOTE — Telephone Encounter (Signed)
Please contact the patient to get her set up for a lab appointment sometime in the next 1 to 2 weeks.  Thanks.

## 2017-10-02 ENCOUNTER — Encounter: Payer: Self-pay | Admitting: Family Medicine

## 2017-10-02 MED ORDER — FLUOXETINE HCL 20 MG PO TABS: 20.0000 mg | ORAL_TABLET | Freq: Every day | ORAL | 3 refills | Status: DC

## 2017-10-02 NOTE — Telephone Encounter (Signed)
Spoke with patient regarding anxiety and depression.  She notes these things have gotten worse over time.  She has put herself on hold as her husband has had multiple major surgeries over the last 6 months.  She notes no SI.  She does note that  she can tell a difference after having come off the Wellbutrin and Prozac about 6 months ago.  We will restart Prozac at 20 mg as this is beneficial.  She was given return precautions.  I will see her next week as planned.  I did discuss that we would give her stool cards at that time.  Also discussed that we may need to have her see GI regardless of stool card results depending on her lab results.

## 2017-10-10 ENCOUNTER — Other Ambulatory Visit: Payer: Managed Care, Other (non HMO)

## 2017-10-10 ENCOUNTER — Ambulatory Visit: Payer: Managed Care, Other (non HMO) | Admitting: Family Medicine

## 2017-10-10 ENCOUNTER — Encounter: Payer: Self-pay | Admitting: Family Medicine

## 2017-10-10 VITALS — BP 130/88 | HR 71 | Temp 98.5°F | Ht 65.0 in | Wt 210.2 lb

## 2017-10-10 DIAGNOSIS — D509 Iron deficiency anemia, unspecified: Secondary | ICD-10-CM

## 2017-10-10 DIAGNOSIS — D508 Other iron deficiency anemias: Secondary | ICD-10-CM

## 2017-10-10 DIAGNOSIS — F419 Anxiety disorder, unspecified: Secondary | ICD-10-CM

## 2017-10-10 DIAGNOSIS — F329 Major depressive disorder, single episode, unspecified: Secondary | ICD-10-CM

## 2017-10-10 DIAGNOSIS — Z1239 Encounter for other screening for malignant neoplasm of breast: Secondary | ICD-10-CM

## 2017-10-10 DIAGNOSIS — H938X2 Other specified disorders of left ear: Secondary | ICD-10-CM

## 2017-10-10 DIAGNOSIS — Z1231 Encounter for screening mammogram for malignant neoplasm of breast: Secondary | ICD-10-CM | POA: Diagnosis not present

## 2017-10-10 DIAGNOSIS — R748 Abnormal levels of other serum enzymes: Secondary | ICD-10-CM

## 2017-10-10 DIAGNOSIS — F32A Depression, unspecified: Secondary | ICD-10-CM

## 2017-10-10 LAB — POCT URINALYSIS DIPSTICK
Bilirubin, UA: NEGATIVE
Glucose, UA: NEGATIVE
Ketones, UA: NEGATIVE
LEUKOCYTES UA: NEGATIVE
NITRITE UA: NEGATIVE
PROTEIN UA: NEGATIVE
RBC UA: NEGATIVE
Urobilinogen, UA: 0.2 E.U./dL
pH, UA: 5 (ref 5.0–8.0)

## 2017-10-10 NOTE — Patient Instructions (Signed)
Nice to see you. We will check lab work today and contact you with the results. We will get you to see ENT and GI. Please continue with your Prozac. If you develop thoughts of harming yourself or others please seek medical attention immediately.

## 2017-10-10 NOTE — Assessment & Plan Note (Signed)
Benign exam.  Refer to ENT. 

## 2017-10-10 NOTE — Assessment & Plan Note (Addendum)
She does continue to have some issues with this.  Has improved slightly with Prozac started recently.  She will continue Prozac.  Could consider adding back Wellbutrin if needed.  Given return precautions.

## 2017-10-10 NOTE — Assessment & Plan Note (Addendum)
Potentially related to inadequate dietary absorption given her history of bariatric surgery.  She did not follow-up to complete prior work-up.  We will have her complete this today.  We will refer to GI for consideration of endoscopy and EGD.  Check urinalysis.  FOBT cards given.

## 2017-10-10 NOTE — Progress Notes (Addendum)
Tommi Rumps, MD Phone: [phone]  Anne James is a 47 y.o. female who presents today for f/u.  Patient lost to follow-up over the last 9 months due to medical issues with her family members  CC: iron deficiency anemia, anxiety/depression, left ear popping  Iron deficiency anemia: Noted on prior lab work.  She has had anemia over the last several years and reports a history of anemia in the past as well noted when she donated blood previously.  She is status post bariatric surgery and has been off and on iron supplements though it constipates her.  She notes no rectal bleeding.  She does not have a menstrual cycle.  She notes no blood in her urine.  No prior EGD or colonoscopy.  She does note in November she had some dyspnea on exertion and though that resolved with taking her iron supplement.  She had no chest pain.  She has had no symptoms since then.  She is due for follow-up labs and FOBT testing.  Anxiety/depression: She notes anxiety and depression.  She notes no SI/HI.  She was recently restarted on Prozac.  She notes she is not as agitated and does not have as much anger.  She has been dealing with a lot at home with her husband having had surgery and just having gone back to work after a number of months.  She is raising her grandchildren.  Her mom had an MI as well.  Patient reports she was seen for left ear popping at urgent care and treated for an ear infection.  She notes since then her left ear has been popping intermittently.  It feels funky per her report and muffled.  He does note some decreased hearing in left ear.  No vertigo or tinnitus.  Social History   Tobacco Use  Smoking Status Never Smoker  Smokeless Tobacco Never Used     ROS see history of present illness  Objective  Physical Exam Vitals:   10/10/17 1343 10/10/17 1423  BP: 140/90 130/88  Pulse: 71   Temp: 98.5 F (36.9 C)   SpO2: 98%     BP Readings from Last 3 Encounters:  10/10/17 130/88    06/13/17 (!) 144/77  12/14/16 128/88   Wt Readings from Last 3 Encounters:  10/10/17 210 lb 3.2 oz (95.3 kg)  06/13/17 207 lb (93.9 kg)  12/14/16 198 lb 8 oz (90 kg)    Physical Exam  Constitutional: No distress.  HENT:  Slight decreased hearing to finger rub on the left, normal TMs bilaterally  Cardiovascular: Normal rate, regular rhythm and normal heart sounds.  Pulmonary/Chest: Effort normal and breath sounds normal.  Abdominal: Soft. Bowel sounds are normal. She exhibits no distension. There is no tenderness. There is no rebound and no guarding.  Musculoskeletal: She exhibits no edema.  Neurological: She is alert.  Skin: Skin is warm and dry. She is not diaphoretic.  Psychiatric:  Depressed, affect flat, intermittently tearful     Assessment/Plan: Please see individual problem list.  Anxiety and depression She does continue to have some issues with this.  Has improved slightly with Prozac started recently.  She will continue Prozac.  Could consider adding back Wellbutrin if needed.  Given return precautions.  Iron deficiency anemia secondary to inadequate dietary iron intake Potentially related to inadequate dietary absorption given her history of bariatric surgery.  She did not follow-up to complete prior work-up.  We will have her complete this today.  We will refer to  GI for consideration of endoscopy and EGD.  Check urinalysis.  FOBT cards given.  Ear popping, left Benign exam.  Refer to ENT.   Orders Placed This Encounter  Procedures  . MM 3D SCREEN BREAST BILATERAL    Standing Status:   Future    Standing Expiration Date:   12/12/2018    Order Specific Question:   Reason for Exam (SYMPTOM  OR DIAGNOSIS REQUIRED)    Answer:   screening    Order Specific Question:   Is the patient pregnant?    Answer:   No    Order Specific Question:   Preferred imaging location?    Answer:   External  . Comp Met (CMET)  . Ambulatory referral to Gastroenterology    Referral  Priority:   Routine    Referral Type:   Consultation    Referral Reason:   Specialty Services Required    Number of Visits Requested:   1  . Ambulatory referral to ENT    Referral Priority:   Routine    Referral Type:   Consultation    Referral Reason:   Specialty Services Required    Requested Specialty:   Otolaryngology    Number of Visits Requested:   1  . POCT Urinalysis Dipstick    No orders of the defined types were placed in this encounter.    Tommi Rumps, MD [place]

## 2017-10-11 ENCOUNTER — Encounter: Payer: Self-pay | Admitting: Gastroenterology

## 2017-10-11 LAB — COMPREHENSIVE METABOLIC PANEL
ALBUMIN: 4.1 g/dL (ref 3.5–5.5)
ALT: 20 IU/L (ref 0–32)
AST: 22 IU/L (ref 0–40)
Albumin/Globulin Ratio: 1.6 (ref 1.2–2.2)
Alkaline Phosphatase: 72 IU/L (ref 39–117)
BUN/Creatinine Ratio: 21 (ref 9–23)
BUN: 14 mg/dL (ref 6–24)
Bilirubin Total: <0.2 mg/dL (ref 0.0–1.2)
CO2: 22 mmol/L (ref 20–29)
CREATININE: 0.67 mg/dL (ref 0.57–1.00)
Calcium: 9.5 mg/dL (ref 8.7–10.2)
Chloride: 102 mmol/L (ref 96–106)
GFR calc Af Amer: 119 mL/min/{1.73_m2} (ref >59)
GFR calc non Af Amer: 104 mL/min/{1.73_m2} (ref >59)
Globulin, Total: 2.5 g/dL (ref 1.5–4.5)
Glucose: 102 mg/dL — ABNORMAL HIGH (ref 65–99)
Potassium: 4.5 mmol/L (ref 3.5–5.2)
Sodium: 139 mmol/L (ref 134–144)
Total Protein: 6.6 g/dL (ref 6.0–8.5)

## 2017-10-11 LAB — CBC
HEMATOCRIT: 37.4 % (ref 34.0–46.6)
Hemoglobin: 12.1 g/dL (ref 11.1–15.9)
MCH: 27.5 pg (ref 26.6–33.0)
MCHC: 32.4 g/dL (ref 31.5–35.7)
MCV: 85 fL (ref 79–97)
Platelets: 358 10*3/uL (ref 150–450)
RBC: 4.4 x10E6/uL (ref 3.77–5.28)
RDW: 14.9 % (ref 12.3–15.4)
WBC: 7.1 10*3/uL (ref 3.4–10.8)

## 2017-10-11 LAB — IRON,TIBC AND FERRITIN PANEL
Ferritin: 22 ng/mL (ref 15–150)
IRON: 48 ug/dL (ref 27–159)
Iron Saturation: 16 % (ref 15–55)
Total Iron Binding Capacity: 303 ug/dL (ref 250–450)
UIBC: 255 ug/dL (ref 131–425)

## 2017-10-11 LAB — VITAMIN B12: Vitamin B-12: 470 pg/mL (ref 232–1245)

## 2017-11-01 ENCOUNTER — Other Ambulatory Visit: Payer: Self-pay | Admitting: Family Medicine

## 2017-11-01 MED ORDER — CELECOXIB 200 MG PO CAPS: 200.0000 mg | ORAL_CAPSULE | Freq: Every day | ORAL | 0 refills | Status: DC

## 2017-11-01 NOTE — Telephone Encounter (Signed)
Last OV 10/10/17 last filled Klonopin 12/14/16 60 0rf

## 2017-11-03 MED ORDER — CLONAZEPAM 0.5 MG PO TABS: 0.5000 mg | ORAL_TABLET | Freq: Two times a day (BID) | ORAL | 0 refills | Status: DC | PRN

## 2017-11-03 NOTE — Telephone Encounter (Signed)
Sent to pharmacy.  Controlled substance database reviewed.  Patient was previously referred to GI though it does not sound as though they or able to get in touch with her.  Please contact the patient and provide her with the number to contact [facility] to set up an appointment.  She was also supposed to complete stool cards.  Please remind her of that.  Thanks.

## 2017-11-10 NOTE — Telephone Encounter (Signed)
Called Pt to give her info of rx was sent to pharmacy also to tell her  Dr Birdie SonsSonnenberg wants her to set an a appt with [facility] de[facility]. Did not get pt will try back later

## 2017-11-14 ENCOUNTER — Encounter: Payer: Self-pay | Admitting: Family Medicine

## 2017-11-21 ENCOUNTER — Encounter: Payer: Self-pay | Admitting: Family Medicine

## 2017-12-01 ENCOUNTER — Other Ambulatory Visit: Payer: Self-pay | Admitting: Family Medicine

## 2017-12-06 NOTE — Telephone Encounter (Signed)
Sent to pharmacy. The patient was supposed to see GI for her iron deficiency anemia and complete stool cards. Please contact her to see if she is still willing to complete these and see GI. I would recommend that she do both. Thanks.

## 2017-12-06 NOTE — Telephone Encounter (Signed)
Last OV 10/10/2017   Last refilled 10/02/2017 dosp 30 with 2 refills   Sent to PCP

## 2017-12-07 ENCOUNTER — Encounter: Payer: Self-pay | Admitting: Family Medicine

## 2017-12-07 ENCOUNTER — Other Ambulatory Visit: Payer: Self-pay | Admitting: Family Medicine

## 2017-12-07 NOTE — Telephone Encounter (Signed)
Last OV  10/10/2017   Last refilled 11/03/2017 disp 60 with no refills   Sent to PCP to advise

## 2017-12-07 NOTE — Telephone Encounter (Signed)
rx request 

## 2017-12-08 ENCOUNTER — Other Ambulatory Visit: Payer: Self-pay | Admitting: Family Medicine

## 2017-12-08 MED ORDER — CLONAZEPAM 0.5 MG PO TABS: 0.5000 mg | ORAL_TABLET | Freq: Two times a day (BID) | ORAL | 0 refills | Status: DC | PRN

## 2017-12-08 NOTE — Telephone Encounter (Signed)
Rx refill Last filled on 11-03-17 Patient last seen on 10-10-17

## 2017-12-08 NOTE — Telephone Encounter (Signed)
Sent to PCP please advise.  

## 2017-12-08 NOTE — Telephone Encounter (Signed)
Controlled substance database reviewed.  Refill sent to pharmacy. 

## 2017-12-12 NOTE — Telephone Encounter (Signed)
Please see the my chart message from 11/21/2017.  Please print out the attached form and then I can look at it and fill it out.

## 2017-12-13 ENCOUNTER — Encounter: Payer: Self-pay | Admitting: Family Medicine

## 2017-12-19 NOTE — Telephone Encounter (Signed)
Pt calling back to check status of paperwork. Requesting a call back to know if she can pick them up tomorrow. Please advise.

## 2017-12-28 ENCOUNTER — Encounter: Payer: Self-pay | Admitting: Family Medicine

## 2018-01-17 ENCOUNTER — Other Ambulatory Visit: Payer: Self-pay | Admitting: Family Medicine

## 2018-01-17 NOTE — Telephone Encounter (Signed)
Last OV 10/10/2017   Last refilled 12/08/2017 disp 60 with no refills   Sent to PCP for approval

## 2018-01-19 MED ORDER — CLONAZEPAM 0.5 MG PO TABS: 0.5000 mg | ORAL_TABLET | Freq: Two times a day (BID) | ORAL | 0 refills | Status: DC | PRN

## 2018-01-19 NOTE — Telephone Encounter (Signed)
Controlled substance database reviewed. Sent to pharmacy.  Patient was supposed to see GI for her iron deficiency. It appears that they left a message. Please see if she was able to call them back to set up an appointment. If she has not I will place another referral. Thanks.

## 2018-01-21 ENCOUNTER — Emergency Department
Admission: EM | Admit: 2018-01-21 | Discharge: 2018-01-22 | Disposition: A | Discharge status: Home / Self Care | Payer: Managed Care, Other (non HMO) | Attending: Emergency Medicine | Admitting: Emergency Medicine

## 2018-01-21 ENCOUNTER — Other Ambulatory Visit: Payer: Self-pay

## 2018-01-21 ENCOUNTER — Encounter: Payer: Self-pay | Admitting: *Deleted

## 2018-01-21 DIAGNOSIS — I10 Essential (primary) hypertension: Secondary | ICD-10-CM | POA: Diagnosis not present

## 2018-01-21 DIAGNOSIS — Z96651 Presence of right artificial knee joint: Secondary | ICD-10-CM | POA: Insufficient documentation

## 2018-01-21 DIAGNOSIS — Z79899 Other long term (current) drug therapy: Secondary | ICD-10-CM | POA: Diagnosis not present

## 2018-01-21 DIAGNOSIS — R1013 Epigastric pain: Secondary | ICD-10-CM | POA: Diagnosis present

## 2018-01-21 DIAGNOSIS — Z9884 Bariatric surgery status: Secondary | ICD-10-CM | POA: Diagnosis not present

## 2018-01-21 DIAGNOSIS — K2920 Alcoholic gastritis without bleeding: Secondary | ICD-10-CM | POA: Diagnosis not present

## 2018-01-21 LAB — COMPREHENSIVE METABOLIC PANEL
ALK PHOS: 73 U/L (ref 38–126)
ALT: 18 U/L (ref 0–44)
AST: 24 U/L (ref 15–41)
Albumin: 3.7 g/dL (ref 3.5–5.0)
Anion gap: 12 (ref 5–15)
BILIRUBIN TOTAL: 0.5 mg/dL (ref 0.3–1.2)
BUN: 19 mg/dL (ref 6–20)
CALCIUM: 8.9 mg/dL (ref 8.9–10.3)
CHLORIDE: 105 mmol/L (ref 98–111)
CO2: 25 mmol/L (ref 22–32)
CREATININE: 0.74 mg/dL (ref 0.44–1.00)
GFR calc Af Amer: >60 mL/min (ref >60)
Glucose, Bld: 102 mg/dL — ABNORMAL HIGH (ref 70–99)
Potassium: 3.9 mmol/L (ref 3.5–5.1)
Sodium: 142 mmol/L (ref 135–145)
TOTAL PROTEIN: 6.5 g/dL (ref 6.5–8.1)

## 2018-01-21 LAB — LIPASE, BLOOD: LIPASE: 34 U/L (ref 11–51)

## 2018-01-21 LAB — CBC
HEMATOCRIT: 42.3 % (ref 36.0–46.0)
Hemoglobin: 13.2 g/dL (ref 12.0–15.0)
MCH: 28 pg (ref 26.0–34.0)
MCHC: 31.2 g/dL (ref 30.0–36.0)
MCV: 89.8 fL (ref 80.0–100.0)
NRBC: 0 % (ref 0.0–0.2)
PLATELETS: 322 10*3/uL (ref 150–400)
RBC: 4.71 MIL/uL (ref 3.87–5.11)
RDW: 14.9 % (ref 11.5–15.5)
WBC: 9.9 10*3/uL (ref 4.0–10.5)

## 2018-01-21 MED ORDER — ONDANSETRON HCL 4 MG/2ML IJ SOLN
4.0000 mg | Freq: Once | INTRAMUSCULAR | Status: AC
  Administered 2018-01-21: 4 mg via INTRAVENOUS
  Filled 2018-01-21: qty 2

## 2018-01-21 MED ORDER — SODIUM CHLORIDE 0.9 % IV SOLN
1000.0000 mL | Freq: Once | INTRAVENOUS | Status: AC
  Administered 2018-01-21: 1000 mL via INTRAVENOUS

## 2018-01-21 MED ORDER — METOCLOPRAMIDE HCL 5 MG/ML IJ SOLN
10.0000 mg | Freq: Once | INTRAMUSCULAR | Status: AC
  Administered 2018-01-22: 10 mg via INTRAVENOUS
  Filled 2018-01-21: qty 2

## 2018-01-21 MED ORDER — ALUM & MAG HYDROXIDE-SIMETH 200-200-20 MG/5ML PO SUSP
30.0000 mL | Freq: Once | ORAL | Status: AC
  Administered 2018-01-22: 30 mL via ORAL
  Filled 2018-01-21: qty 30

## 2018-01-21 MED ORDER — DIPHENHYDRAMINE HCL 50 MG/ML IJ SOLN
25.0000 mg | Freq: Once | INTRAMUSCULAR | Status: AC
  Administered 2018-01-22: 25 mg via INTRAVENOUS
  Filled 2018-01-21: qty 1

## 2018-01-21 MED ORDER — MORPHINE SULFATE (PF) 4 MG/ML IV SOLN
4.0000 mg | Freq: Once | INTRAVENOUS | Status: AC
  Administered 2018-01-21: 4 mg via INTRAVENOUS
  Filled 2018-01-21: qty 1

## 2018-01-21 MED ORDER — LIDOCAINE VISCOUS HCL 2 % MT SOLN
15.0000 mL | Freq: Once | OROMUCOSAL | Status: AC
  Administered 2018-01-22: 15 mL via ORAL
  Filled 2018-01-21: qty 15

## 2018-01-21 NOTE — ED Notes (Signed)
Pt assisted to wheelchair upon arrival; c/o upper abd pain that she says is worse than when she had her gallbladder removed over a year ago;

## 2018-01-21 NOTE — ED Triage Notes (Signed)
PT to ED reporting sharp and stabbing pains in her abd for the past three hours. Pt had her gallbladder removed last year but reports this pain feels very similar to that pain. No diarrhea. Nausea and vomiting with 7 episodes of vomiting.

## 2018-01-21 NOTE — ED Provider Notes (Signed)
[facility] Emergency Department Provider Note   ____________________________________________    I have reviewed the triage vital signs and the nursing notes.   HISTORY  Chief Complaint Abdominal Pain     HPI Anne James is a 47 y.o. female who presents with epigastric abdominal pain.  Patient reports symptoms started approximately 3 hours ago.  She reports sharp moderate/severe upper abdominal pain.  She has a history of cholecystectomy.  She does have a history of acid reflux.  No history of peptic ulcer disease.  Denies fevers or chills.  Has not taken anything for this.  Does admit to drinking moderate amounts of alcohol.  Past Medical History:  Diagnosis Date  . Anemia    low iron  . Anxiety   . 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 Chicken pox   . Depression   . Eating disorder   . GERD (gastroesophageal reflux disease)   . History of endometrial ablation 09/17/2016   In 2006. No periods since 2006.  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 Hypertension   . Phlebitis   . Primary localized osteoarthritis of left knee   . Primary localized osteoarthritis of right knee 01/06/2016  . 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 Ear popping, left 10/10/2017  . H/O bariatric surgery 12/14/2016  . Iron deficiency anemia secondary to inadequate dietary iron intake 12/14/2016  . Hot flashes 09/12/2016  . Status post right knee replacement 03/02/2016  . Primary localized osteoarthritis of right knee 01/06/2016  . Neck pain 11/30/2015  . 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 Primary localized osteoarthritis of left knee   . Anxiety and depression 06/08/2015  . Chronic fatigue 07/02/2014  . Right hip pain 02/23/2012  . Osteoarthritis of both knees 10/17/2011  . Obesity (BMI 30-39.9)  10/17/2011  . History of pulmonary embolus (PE) 03/14/2000    Past Surgical History:  Procedure Laterality Date  . CHOLECYSTECTOMY N/A 09/18/2016   Procedure: LAPAROSCOPIC CHOLECYSTECTOMY;  Surgeon: Leafy Ro, MD;  Location: [facility] ORS;  Service: General;  Laterality: N/A;  . GASTRIC BYPASS  2002   [facility], Dr. Kennedy Bucker  . NOVASURE ABLATION    . TOTAL KNEE ARTHROPLASTY Left 06/29/2015   Procedure: LEFT TOTAL KNEE ARTHROPLASTY;  Surgeon: Salvatore Marvel, MD;  Location: [facility] OR;  Service: Orthopedics;  Laterality: Left;  . TOTAL KNEE ARTHROPLASTY Right 01/18/2016   Procedure: TOTAL KNEE ARTHROPLASTY;  Surgeon: Salvatore Marvel, MD;  Location: [facility] OR;  Service: Orthopedics;  Laterality: Right;  . TUBAL LIGATION      Prior to Admission medications   Medication Sig Start Date End Date Taking? Authorizing Provider  celecoxib (CELEBREX) 200 MG capsule Take 1 capsule (200 mg total) by mouth daily. 11/01/17   Glori Luis, MD  clonazePAM (KLONOPIN) 0.5 MG tablet Take 1 tablet (0.5 mg total) by mouth 2 (two) times daily as needed for anxiety. 01/19/18   Glori Luis, MD  ferrous sulfate 325 (65 FE) MG EC tablet Take 1 tablet (325 mg total) by mouth 2 (two) times daily with a meal. 12/23/16   Glori Luis, MD  FLUoxetine (PROZAC) 20 MG tablet Take 1 tablet (20 mg total) by mouth daily. 10/02/17   Glori Luis, MD  FLUoxetine (PROZAC) 40 MG  capsule TAKE 1 CAPSULE BY MOUTH  DAILY 12/06/17   Glori Luis, MD  fluticasone [facility]) 50 MCG/ACT nasal spray Place 2 sprays into both nostrils daily. 07/13/16   Glori Luis, MD  Multiple Vitamin (MULTIVITAMIN) capsule Take 1 capsule by mouth daily.    [provider]  ranitidine (ZANTAC) 150 MG tablet Take 150 mg by mouth daily as needed for heartburn.    [provider]     Allergies Patient has no known allergies.  Family History  Problem Relation Age of Onset  . Alcohol abuse Mother   . Drug abuse Mother    . CAD Mother   . Arthritis Maternal Grandmother   . Heart disease Maternal Grandmother   . Stroke Maternal Grandmother   . Hypertension Maternal Grandmother   . Kidney disease Maternal Grandmother   . Diabetes Maternal Grandmother   . Anxiety disorder Brother   . Hypertension Father     Social History Social History   Tobacco Use  . Smoking status: Never Smoker  . Smokeless tobacco: Never Used  Substance Use Topics  . Alcohol use: Yes  . Drug use: No    Review of Systems  Constitutional: No fever/chills Eyes: No visual changes.  ENT: No sore throat. Cardiovascular: Denies chest pain. Respiratory: Denies shortness of breath. Gastrointestinal: As above Genitourinary: Negative for dysuria. Musculoskeletal: Negative for back pain. Skin: Negative for rash. Neurological: Negative for headaches or weakness   ____________________________________________   PHYSICAL EXAM:  VITAL SIGNS: ED Triage Vitals  Enc Vitals Group     BP 01/21/18 2236 (!) 138/115     Pulse Rate 01/21/18 2236 97     Resp 01/21/18 2236 20     Temp 01/21/18 2236 98.1 F (36.7 C)     Temp Source 01/21/18 2236 Oral     SpO2 01/21/18 2236 96 %     Weight 01/21/18 2236 90.7 kg (200 lb)     Height 01/21/18 2236 1.651 m (5\' 5" )     Head Circumference --      Peak Flow --      Pain Score 01/21/18 2238 10     Pain Loc --      Pain Edu? --      Excl. in GC? --     Constitutional: Alert and oriented. Eyes: Conjunctivae are normal.   Nose: No congestion/rhinnorhea. Mouth/Throat: Mucous membranes are moist.    Cardiovascular: Normal rate, regular rhythm. Grossly normal heart sounds.  Good peripheral circulation. Respiratory: Normal respiratory effort.  No retractions. Lungs CTAB. Gastrointestinal: Mild tenderness in the epigastrium, no distention  Musculoskeletal: No lower extremity tenderness nor edema.  Warm and well perfused Neurologic:  Normal speech and language. No gross focal neurologic  deficits are appreciated.  Skin:  Skin is warm, dry and intact. No rash noted. Psychiatric: Mood and affect are normal. Speech and behavior are normal.  ____________________________________________   LABS (all labs ordered are listed, but only abnormal results are displayed)  Labs Reviewed  LIPASE, BLOOD  COMPREHENSIVE METABOLIC PANEL  CBC  URINALYSIS, COMPLETE (UACMP) WITH MICROSCOPIC   ____________________________________________  EKG  None ____________________________________________  RADIOLOGY  None ____________________________________________   PROCEDURES  Procedure(s) performed: No  Procedures   Critical Care performed: No ____________________________________________   INITIAL IMPRESSION / ASSESSMENT AND PLAN / ED COURSE  Pertinent labs & imaging results that were available during my care of the patient were reviewed by me and considered in my medical decision making (see chart for details).  Patient presents with upper abdominal pain.  Differential includes gastritis, PUD, pancreatitis.  Pending labs, will give IV morphine, IV Zofran, IV fluids.  Have asked Dr. Scotty Court to follow-up on labs and reevaluate.    ____________________________________________   FINAL CLINICAL IMPRESSION(S) / ED DIAGNOSES  Upper abdominal pain     Note:  This document was prepared using Dragon voice recognition software and may include unintentional dictation errors.    Jene Every, MD 01/21/18 [phone]

## 2018-01-21 NOTE — ED Notes (Signed)
Pt with nausea and vomiting while waiting for triage

## 2018-01-22 LAB — URINALYSIS, COMPLETE (UACMP) WITH MICROSCOPIC
BACTERIA UA: NONE SEEN
Bilirubin Urine: NEGATIVE
Glucose, UA: NEGATIVE mg/dL
Hgb urine dipstick: NEGATIVE
Ketones, ur: 20 mg/dL — AB
Leukocytes, UA: NEGATIVE
Nitrite: NEGATIVE
PROTEIN: NEGATIVE mg/dL
Specific Gravity, Urine: 1.014 (ref 1.005–1.030)
pH: 5 (ref 5.0–8.0)

## 2018-01-22 MED ORDER — FAMOTIDINE 20 MG PO TABS: 20.0000 mg | ORAL_TABLET | Freq: Two times a day (BID) | ORAL | 0 refills | Status: DC

## 2018-01-22 MED ORDER — SUCRALFATE 1 G PO TABS: 1.0000 g | ORAL_TABLET | Freq: Four times a day (QID) | ORAL | 1 refills | Status: DC

## 2018-01-22 NOTE — ED Notes (Signed)
Patient tolerating po intake

## 2018-01-22 NOTE — ED Provider Notes (Signed)
 -----------------------------------------   1:37 AM on 01/22/2018 -----------------------------------------  Assumed care from Dr. Peter Minium at 11:00 PM.  Labs are all normal.  Vital signs remain normal.  Patient feels better after GI cocktail, tolerating oral intake.  Repeat abdominal exam performed which is benign without tenderness.  Consistent with alcoholic gastritis which is now improved.  Counseled the patient on avoiding alcohol and smoking, take antacids, follow-up with primary care.   Sharman Cheek, MD 01/22/18 [phone]

## 2018-01-30 ENCOUNTER — Other Ambulatory Visit: Payer: Self-pay

## 2018-01-30 ENCOUNTER — Encounter: Payer: Self-pay | Admitting: Emergency Medicine

## 2018-01-30 ENCOUNTER — Emergency Department
Admission: EM | Admit: 2018-01-30 | Discharge: 2018-01-30 | Disposition: A | Discharge status: Home / Self Care | Payer: Managed Care, Other (non HMO) | Source: Home / Self Care | Attending: Emergency Medicine | Admitting: Emergency Medicine

## 2018-01-30 ENCOUNTER — Emergency Department: Payer: Managed Care, Other (non HMO)

## 2018-01-30 DIAGNOSIS — I10 Essential (primary) hypertension: Secondary | ICD-10-CM | POA: Insufficient documentation

## 2018-01-30 DIAGNOSIS — Z79899 Other long term (current) drug therapy: Secondary | ICD-10-CM

## 2018-01-30 DIAGNOSIS — K2920 Alcoholic gastritis without bleeding: Secondary | ICD-10-CM

## 2018-01-30 DIAGNOSIS — K469 Unspecified abdominal hernia without obstruction or gangrene: Secondary | ICD-10-CM | POA: Diagnosis not present

## 2018-01-30 DIAGNOSIS — R1084 Generalized abdominal pain: Secondary | ICD-10-CM | POA: Diagnosis not present

## 2018-01-30 LAB — URINALYSIS, COMPLETE (UACMP) WITH MICROSCOPIC
Bilirubin Urine: NEGATIVE
GLUCOSE, UA: NEGATIVE mg/dL
KETONES UR: 80 mg/dL — AB
LEUKOCYTES UA: NEGATIVE
Nitrite: NEGATIVE
PH: 6 (ref 5.0–8.0)
Protein, ur: NEGATIVE mg/dL
Specific Gravity, Urine: 1.011 (ref 1.005–1.030)

## 2018-01-30 LAB — COMPREHENSIVE METABOLIC PANEL
ALK PHOS: 70 U/L (ref 38–126)
ALT: 18 U/L (ref 0–44)
ANION GAP: 15 (ref 5–15)
AST: 26 U/L (ref 15–41)
Albumin: 4.1 g/dL (ref 3.5–5.0)
BUN: 7 mg/dL (ref 6–20)
CALCIUM: 9.2 mg/dL (ref 8.9–10.3)
CO2: 23 mmol/L (ref 22–32)
Chloride: 97 mmol/L — ABNORMAL LOW (ref 98–111)
Creatinine, Ser: 0.51 mg/dL (ref 0.44–1.00)
GFR calc non Af Amer: >60 mL/min (ref >60)
Glucose, Bld: 104 mg/dL — ABNORMAL HIGH (ref 70–99)
Potassium: 3.9 mmol/L (ref 3.5–5.1)
SODIUM: 135 mmol/L (ref 135–145)
TOTAL PROTEIN: 7.4 g/dL (ref 6.5–8.1)
Total Bilirubin: 0.8 mg/dL (ref 0.3–1.2)

## 2018-01-30 LAB — CBC
HCT: 45 % (ref 36.0–46.0)
Hemoglobin: 14.1 g/dL (ref 12.0–15.0)
MCH: 28.4 pg (ref 26.0–34.0)
MCHC: 31.3 g/dL (ref 30.0–36.0)
MCV: 90.5 fL (ref 80.0–100.0)
NRBC: 0 % (ref 0.0–0.2)
PLATELETS: 402 10*3/uL — AB (ref 150–400)
RBC: 4.97 MIL/uL (ref 3.87–5.11)
RDW: 14.7 % (ref 11.5–15.5)
WBC: 7.7 10*3/uL (ref 4.0–10.5)

## 2018-01-30 LAB — LIPASE, BLOOD: Lipase: 27 U/L (ref 11–51)

## 2018-01-30 LAB — TROPONIN I: Troponin I: <0.03 ng/mL (ref <0.03)

## 2018-01-30 MED ORDER — ONDANSETRON HCL 4 MG/2ML IJ SOLN
INTRAMUSCULAR | Status: AC
  Administered 2018-01-30: 4 mg via INTRAVENOUS
  Filled 2018-01-30: qty 2

## 2018-01-30 MED ORDER — FAMOTIDINE IN NACL 20-0.9 MG/50ML-% IV SOLN
INTRAVENOUS | Status: AC
  Administered 2018-01-30: 20 mg via INTRAVENOUS
  Filled 2018-01-30: qty 50

## 2018-01-30 MED ORDER — FENTANYL CITRATE (PF) 100 MCG/2ML IJ SOLN
50.0000 ug | Freq: Once | INTRAMUSCULAR | Status: AC
  Administered 2018-01-30: 50 ug via INTRAVENOUS
  Filled 2018-01-30: qty 2

## 2018-01-30 MED ORDER — ONDANSETRON 4 MG PO TBDP
ORAL_TABLET | ORAL | Status: AC
  Filled 2018-01-30: qty 1

## 2018-01-30 MED ORDER — ALUM & MAG HYDROXIDE-SIMETH 200-200-20 MG/5ML PO SUSP: 30.0000 mL | Freq: Once | ORAL | Status: DC

## 2018-01-30 MED ORDER — OXYCODONE HCL 5 MG PO TABS
ORAL_TABLET | ORAL | Status: AC
  Filled 2018-01-30: qty 1

## 2018-01-30 MED ORDER — ACETAMINOPHEN 500 MG PO TABS
1000.0000 mg | ORAL_TABLET | Freq: Once | ORAL | Status: AC
  Administered 2018-01-30: 1000 mg via ORAL
  Filled 2018-01-30: qty 2

## 2018-01-30 MED ORDER — LIDOCAINE VISCOUS HCL 2 % MT SOLN: 15.0000 mL | Freq: Once | OROMUCOSAL | Status: DC

## 2018-01-30 MED ORDER — OXYCODONE HCL 5 MG PO TABS
5.0000 mg | ORAL_TABLET | Freq: Once | ORAL | Status: AC
  Administered 2018-01-30: 5 mg via ORAL

## 2018-01-30 MED ORDER — FAMOTIDINE IN NACL 20-0.9 MG/50ML-% IV SOLN
20.0000 mg | Freq: Once | INTRAVENOUS | Status: AC
  Administered 2018-01-30: 20 mg via INTRAVENOUS

## 2018-01-30 MED ORDER — SODIUM CHLORIDE 0.9 % IV BOLUS
1000.0000 mL | Freq: Once | INTRAVENOUS | Status: AC
  Administered 2018-01-30: 1000 mL via INTRAVENOUS

## 2018-01-30 MED ORDER — GI COCKTAIL ~~LOC~~
ORAL | Status: AC
  Administered 2018-01-30: 30 mL
  Filled 2018-01-30: qty 30

## 2018-01-30 MED ORDER — ONDANSETRON 4 MG PO TBDP: 4.0000 mg | ORAL_TABLET | Freq: Three times a day (TID) | ORAL | 0 refills | Status: DC | PRN

## 2018-01-30 MED ORDER — ONDANSETRON HCL 4 MG/2ML IJ SOLN
4.0000 mg | Freq: Once | INTRAMUSCULAR | Status: AC
  Administered 2018-01-30: 4 mg via INTRAVENOUS

## 2018-01-30 MED ORDER — ONDANSETRON 4 MG PO TBDP
4.0000 mg | ORAL_TABLET | Freq: Once | ORAL | Status: AC | PRN
  Administered 2018-01-30: 4 mg via ORAL

## 2018-01-30 NOTE — ED Notes (Signed)
Pt now c/o CP  

## 2018-01-30 NOTE — ED Notes (Signed)
This RN attempted to draw blood with no success. Lab notified

## 2018-01-30 NOTE — ED Provider Notes (Signed)
[facility] Emergency Department Provider Note  ____________________________________________  Time seen: Approximately 4:07 PM  I have reviewed the triage vital signs and the nursing notes.   HISTORY  Chief Complaint Abdominal Pain and Emesis   HPI Anne James is a 47 y.o. female with a history of alcohol abuse, gastric bypass, GERD, anemia who presents for evaluation of abdominal pain.  Patient reports 9 days of epigastric abdominal pain, pressure/ squeezing, non radiating. Pain improving since last visit to the ED 9 days ago however since last night started vomiting again. Several episodes of NBNB emesis. Last BM 2 days ago normal. No melena, no fever, no coffee-ground emesis or hematemesis. Has had chills. Denies drinking since last seen. No CP or SOB. Pain is currently 5/10. Prior surgeries include gastric bypass and cholecystectomy.   Past Medical History:  Diagnosis Date  . Anemia    low iron  . Anxiety   . 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 Chicken pox   . Depression   . Eating disorder   . GERD (gastroesophageal reflux disease)   . History of endometrial ablation 09/17/2016   In 2006. No periods since 2006.  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 Hypertension   . Phlebitis   . Primary localized osteoarthritis of left knee   . Primary localized osteoarthritis of right knee 01/06/2016  . 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 Ear popping, left 10/10/2017  . H/O bariatric surgery 12/14/2016  . Iron deficiency anemia secondary to inadequate dietary iron intake 12/14/2016  . Hot flashes 09/12/2016  . Status post right knee replacement 03/02/2016  . Primary localized osteoarthritis of right knee 01/06/2016  . Neck pain 11/30/2015  . 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  Primary localized osteoarthritis of left knee   . Anxiety and depression 06/08/2015  . Chronic fatigue 07/02/2014  . Right hip pain 02/23/2012  . Osteoarthritis of both knees 10/17/2011  . Obesity (BMI 30-39.9) 10/17/2011  . History of pulmonary embolus (PE) 03/14/2000    Past Surgical History:  Procedure Laterality Date  . CHOLECYSTECTOMY N/A 09/18/2016   Procedure: LAPAROSCOPIC CHOLECYSTECTOMY;  Surgeon: Leafy Ro, MD;  Location: [facility] ORS;  Service: General;  Laterality: N/A;  . GASTRIC BYPASS  2002   [facility], Dr. Kennedy Bucker  . NOVASURE ABLATION    . TOTAL KNEE ARTHROPLASTY Left 06/29/2015   Procedure: LEFT TOTAL KNEE ARTHROPLASTY;  Surgeon: Salvatore Marvel, MD;  Location: [facility] OR;  Service: Orthopedics;  Laterality: Left;  . TOTAL KNEE ARTHROPLASTY Right 01/18/2016   Procedure: TOTAL KNEE ARTHROPLASTY;  Surgeon: Salvatore Marvel, MD;  Location: [facility] OR;  Service: Orthopedics;  Laterality: Right;  . TUBAL LIGATION      Prior to Admission medications   Medication Sig Start Date End Date Taking? Authorizing Provider  celecoxib (CELEBREX) 200 MG capsule Take 1 capsule (200 mg total) by mouth daily. 11/01/17   Glori Luis, MD  clonazePAM (KLONOPIN) 0.5 MG tablet Take 1 tablet (0.5 mg total) by mouth 2 (two) times daily as needed for anxiety. 01/19/18   Glori Luis, MD  famotidine (PEPCID) 20 MG tablet Take 1 tablet (20 mg total) by mouth 2 (two) times daily. 01/22/18   Sharman Cheek, MD  ferrous sulfate 325 (65 FE) MG  EC tablet Take 1 tablet (325 mg total) by mouth 2 (two) times daily with a meal. 12/23/16   Glori Luis, MD  FLUoxetine (PROZAC) 20 MG tablet Take 1 tablet (20 mg total) by mouth daily. 10/02/17   Glori Luis, MD  FLUoxetine (PROZAC) 40 MG capsule TAKE 1 CAPSULE BY MOUTH  DAILY 12/06/17   Glori Luis, MD  fluticasone [facility]) 50 MCG/ACT nasal spray Place 2 sprays into both nostrils daily. 07/13/16   Glori Luis, MD  Multiple Vitamin  (MULTIVITAMIN) capsule Take 1 capsule by mouth daily.    [provider]  ondansetron (ZOFRAN ODT) 4 MG disintegrating tablet Take 1 tablet (4 mg total) by mouth every 8 (eight) hours as needed for nausea or vomiting. 01/30/18   Nita Sickle, MD  sucralfate (CARAFATE) 1 g tablet Take 1 tablet (1 g total) by mouth 4 (four) times daily. 01/22/18   Sharman Cheek, MD    Allergies Patient has no known allergies.  Family History  Problem Relation Age of Onset  . Alcohol abuse Mother   . Drug abuse Mother   . CAD Mother   . Arthritis Maternal Grandmother   . Heart disease Maternal Grandmother   . Stroke Maternal Grandmother   . Hypertension Maternal Grandmother   . Kidney disease Maternal Grandmother   . Diabetes Maternal Grandmother   . Anxiety disorder Brother   . Hypertension Father     Social History Social History   Tobacco Use  . Smoking status: Never Smoker  . Smokeless tobacco: Never Used  Substance Use Topics  . Alcohol use: Yes  . Drug use: No    Review of Systems  Constitutional: Negative for fever. Eyes: Negative for visual changes. ENT: Negative for sore throat. Neck: No neck pain  Cardiovascular: Negative for chest pain. Respiratory: Negative for shortness of breath. Gastrointestinal: + epigastric abdominal pain,nausea, and vomiting. No diarrhea. Genitourinary: Negative for dysuria. Musculoskeletal: Negative for back pain. Skin: Negative for rash. Neurological: Negative for headaches, weakness or numbness. Psych: No SI or HI  ____________________________________________   PHYSICAL EXAM:  VITAL SIGNS: ED Triage Vitals  Enc Vitals Group     BP 01/30/18 1357 132/77     Pulse Rate 01/30/18 1357 91     Resp 01/30/18 1357 16     Temp 01/30/18 1357 98.5 F (36.9 C)     Temp Source 01/30/18 1357 Oral     SpO2 01/30/18 1357 99 %     Weight --      Height --      Head Circumference --      Peak Flow --      Pain Score 01/30/18 1358 7      Pain Loc --      Pain Edu? --      Excl. in GC? --     Constitutional: Alert and oriented. Well appearing and in no apparent distress. HEENT:      Head: Normocephalic and atraumatic.         Eyes: Conjunctivae are normal. Sclera is non-icteric.       Mouth/Throat: Mucous membranes are moist.       Neck: Supple with no signs of meningismus. Cardiovascular: Regular rate and rhythm. No murmurs, gallops, or rubs. 2+ symmetrical distal pulses are present in all extremities. No JVD. Respiratory: Normal respiratory effort. Lungs are clear to auscultation bilaterally. No wheezes, crackles, or rhonchi.  Gastrointestinal: Soft, mild epigastric and LUQ ttp, and non distended with positive  bowel sounds. No rebound or guarding. Musculoskeletal: Nontender with normal range of motion in all extremities. No edema, cyanosis, or erythema of extremities. Neurologic: Normal speech and language. Face is symmetric. Moving all extremities. No gross focal neurologic deficits are appreciated. Skin: Skin is warm, dry and intact. No rash noted. Psychiatric: Mood and affect are normal. Speech and behavior are normal.  ____________________________________________   LABS (all labs ordered are listed, but only abnormal results are displayed)  Labs Reviewed  COMPREHENSIVE METABOLIC PANEL - Abnormal; Notable for the following components:      Result Value   Chloride 97 (*)    Glucose, Bld 104 (*)    All other components within normal limits  CBC - Abnormal; Notable for the following components:   Platelets 402 (*)    All other components within normal limits  URINALYSIS, COMPLETE (UACMP) WITH MICROSCOPIC - Abnormal; Notable for the following components:   Color, Urine YELLOW (*)    APPearance CLEAR (*)    Hgb urine dipstick SMALL (*)    Ketones, ur 80 (*)    Bacteria, UA RARE (*)    All other components within normal limits  LIPASE, BLOOD  TROPONIN I  POC URINE PREG, ED    ____________________________________________  EKG  ED ECG REPORT I, Nita Sickle, the attending physician, personally viewed and interpreted this ECG.  Normal sinus rhythm, rate of 86, normal intervals, normal axis, occasional PVCs, no ST elevations or depressions.  Unchanged from prior. ____________________________________________  RADIOLOGY  I have personally reviewed the images performed during this visit and I agree with the Radiologist's read.   Interpretation by Radiologist:  Dg Abdomen Acute W/chest  Result Date: 01/30/2018 CLINICAL DATA:  Recurrent abdominal pain, nausea and vomiting. Recent diagnosis of gastritis and gastric bypass. EXAM: DG ABDOMEN ACUTE W/ 1V CHEST COMPARISON:  Chest radiograph September 23, 2016 FINDINGS: Cardiomediastinal silhouette is normal. Lungs are clear, no pleural effusions. No pneumothorax. Soft tissue planes and included osseous structures are unremarkable. Bowel gas pattern is nondilated and nonobstructive. Surgical clips in the included right abdomen compatible with cholecystectomy. Surgical stable LEFT abdomen compatible with history of gastric bypass. But no intra-abdominal mass effect, pathologic calcifications or free air. Soft tissue planes and included osseous structures are non-suspicious. Lumbar levoscoliosis. IMPRESSION: Normal bowel gas pattern.  No acute cardiopulmonary disease. Electronically Signed   By: Awilda Metro M.D.   On: 01/30/2018 18:01      ____________________________________________   PROCEDURES  Procedure(s) performed: None Procedures Critical Care performed:  None ____________________________________________   INITIAL IMPRESSION / ASSESSMENT AND PLAN / ED COURSE   47 y.o. female with a history of alcohol abuse, gastric bypass, GERD, anemia who presents for evaluation of epigastric abdominal pain, nausea and vomiting.  Symptoms started after an episode of binge drinking.  Patient denies any further alcohol  intake however her family member in the room keeps gesticulating behind her back that she has been drinking.  Abdomen is soft with mild epigastric and left upper quadrant tenderness with no rebound or guarding.  Vitals are within normal limits with no fever or tachycardia.  Labs showing no leukocytosis, normal CMP and lipase.  EKG and troponin with no evidence of ischemia.  Differential diagnoses including gastritis versus peptic ulcer disease.  Less likely SBO with no distention, patient is passing flatus and having normal bowel movements.  No signs of appendicitis with no right lower quadrant tenderness.  Patient is status post cholecystectomy therefore less likely gallbladder disease.  Pancreas enzymes  are normal with no signs of pancreatitis.  Will treat with IV fluids, Zofran, Pepcid.   Clinical Course as of Jan 30 1853  Tue Jan 30, 2018  [id number] Patient's mother came out of the room to tell me that patient was having worsening pain and told me that patient did drink alcohol last night and that is when she started to feel worse.    [CV]  [id number] Patient feels better, tolerating p.o.  Abdominal exam remains benign.  Will discharge home on Pepcid, Zofran.  Discussed alcohol abstinence to allow for her stomach to heals and follow-up with primary care doctor.  Discussed standard return precautions.   [CV]    Clinical Course User Index [CV] Don PerkingVeronese, [place], MD     As part of my medical decision making, I reviewed the following data within the electronic MEDICAL RECORD NUMBER History obtained from family, Nursing notes reviewed and incorporated, Labs reviewed , EKG interpreted , Old EKG reviewed, Old chart reviewed, Radiograph reviewed , Notes from prior ED visits and [place] Controlled Substance Database    Pertinent labs & imaging results that were available during my care of the patient were reviewed by me and considered in my medical decision making (see chart for  details).    ____________________________________________   FINAL CLINICAL IMPRESSION(S) / ED DIAGNOSES  Final diagnoses:  Acute alcoholic gastritis without hemorrhage      NEW MEDICATIONS STARTED DURING THIS VISIT:  ED Discharge Orders         Ordered    ondansetron (ZOFRAN ODT) 4 MG disintegrating tablet  Every 8 hours PRN     01/30/18 1854           Note:  This document was prepared using Dragon voice recognition software and may include unintentional dictation errors.    Nita SickleVeronese, [place], MD 01/30/18 [phone]

## 2018-01-30 NOTE — ED Notes (Signed)
Patient transported to X-ray 

## 2018-01-30 NOTE — ED Triage Notes (Signed)
PT was seen here last week and dx with with Gastritis. PT states she began to feel better but abd pain, nausea and vomiting started again last night. PT given meds but no relief.  VSS , pt appears uncomfortable

## 2018-01-31 ENCOUNTER — Other Ambulatory Visit: Payer: Self-pay

## 2018-01-31 ENCOUNTER — Emergency Department: Payer: Managed Care, Other (non HMO)

## 2018-01-31 ENCOUNTER — Encounter: Payer: Self-pay | Admitting: Emergency Medicine

## 2018-01-31 ENCOUNTER — Inpatient Hospital Stay
Admission: EM | Admit: 2018-01-31 | Discharge: 2018-02-06 | DRG: 354 | Disposition: A | Discharge status: Home / Self Care | Payer: Managed Care, Other (non HMO) | Attending: Surgery | Admitting: Surgery

## 2018-01-31 DIAGNOSIS — R112 Nausea with vomiting, unspecified: Secondary | ICD-10-CM | POA: Diagnosis not present

## 2018-01-31 DIAGNOSIS — F329 Major depressive disorder, single episode, unspecified: Secondary | ICD-10-CM | POA: Diagnosis present

## 2018-01-31 DIAGNOSIS — Z9884 Bariatric surgery status: Secondary | ICD-10-CM

## 2018-01-31 DIAGNOSIS — K561 Intussusception: Secondary | ICD-10-CM | POA: Diagnosis not present

## 2018-01-31 DIAGNOSIS — Z9049 Acquired absence of other specified parts of digestive tract: Secondary | ICD-10-CM

## 2018-01-31 DIAGNOSIS — R1084 Generalized abdominal pain: Secondary | ICD-10-CM | POA: Diagnosis present

## 2018-01-31 DIAGNOSIS — K469 Unspecified abdominal hernia without obstruction or gangrene: Principal | ICD-10-CM | POA: Diagnosis present

## 2018-01-31 DIAGNOSIS — K56609 Unspecified intestinal obstruction, unspecified as to partial versus complete obstruction: Secondary | ICD-10-CM

## 2018-01-31 DIAGNOSIS — Z23 Encounter for immunization: Secondary | ICD-10-CM

## 2018-01-31 DIAGNOSIS — K5792 Diverticulitis of intestine, part unspecified, without perforation or abscess without bleeding: Secondary | ICD-10-CM | POA: Diagnosis present

## 2018-01-31 DIAGNOSIS — F411 Generalized anxiety disorder: Secondary | ICD-10-CM | POA: Diagnosis present

## 2018-01-31 DIAGNOSIS — Z86711 Personal history of pulmonary embolism: Secondary | ICD-10-CM

## 2018-01-31 DIAGNOSIS — Z823 Family history of stroke: Secondary | ICD-10-CM

## 2018-01-31 DIAGNOSIS — Z5331 Laparoscopic surgical procedure converted to open procedure: Secondary | ICD-10-CM | POA: Diagnosis not present

## 2018-01-31 DIAGNOSIS — E876 Hypokalemia: Secondary | ICD-10-CM | POA: Diagnosis present

## 2018-01-31 DIAGNOSIS — K219 Gastro-esophageal reflux disease without esophagitis: Secondary | ICD-10-CM | POA: Diagnosis present

## 2018-01-31 DIAGNOSIS — Z6833 Body mass index (BMI) 33.0-33.9, adult: Secondary | ICD-10-CM

## 2018-01-31 DIAGNOSIS — Z818 Family history of other mental and behavioral disorders: Secondary | ICD-10-CM

## 2018-01-31 DIAGNOSIS — Z8672 Personal history of thrombophlebitis: Secondary | ICD-10-CM

## 2018-01-31 DIAGNOSIS — M1990 Unspecified osteoarthritis, unspecified site: Secondary | ICD-10-CM | POA: Diagnosis present

## 2018-01-31 DIAGNOSIS — Z811 Family history of alcohol abuse and dependence: Secondary | ICD-10-CM

## 2018-01-31 DIAGNOSIS — Z8659 Personal history of other mental and behavioral disorders: Secondary | ICD-10-CM

## 2018-01-31 DIAGNOSIS — Z8249 Family history of ischemic heart disease and other diseases of the circulatory system: Secondary | ICD-10-CM | POA: Diagnosis not present

## 2018-01-31 DIAGNOSIS — M170 Bilateral primary osteoarthritis of knee: Secondary | ICD-10-CM | POA: Diagnosis present

## 2018-01-31 DIAGNOSIS — I10 Essential (primary) hypertension: Secondary | ICD-10-CM | POA: Diagnosis present

## 2018-01-31 DIAGNOSIS — Z813 Family history of other psychoactive substance abuse and dependence: Secondary | ICD-10-CM | POA: Diagnosis not present

## 2018-01-31 DIAGNOSIS — Z8261 Family history of arthritis: Secondary | ICD-10-CM | POA: Diagnosis not present

## 2018-01-31 DIAGNOSIS — Z96653 Presence of artificial knee joint, bilateral: Secondary | ICD-10-CM | POA: Diagnosis present

## 2018-01-31 DIAGNOSIS — Z833 Family history of diabetes mellitus: Secondary | ICD-10-CM

## 2018-01-31 DIAGNOSIS — Z79899 Other long term (current) drug therapy: Secondary | ICD-10-CM

## 2018-01-31 DIAGNOSIS — F419 Anxiety disorder, unspecified: Secondary | ICD-10-CM | POA: Diagnosis present

## 2018-01-31 DIAGNOSIS — R1110 Vomiting, unspecified: Secondary | ICD-10-CM

## 2018-01-31 HISTORY — DX: Intussusception: K56.1

## 2018-01-31 LAB — COMPREHENSIVE METABOLIC PANEL
ALBUMIN: 3.9 g/dL (ref 3.5–5.0)
ALT: 15 U/L (ref 0–44)
AST: 24 U/L (ref 15–41)
Alkaline Phosphatase: 64 U/L (ref 38–126)
Anion gap: 12 (ref 5–15)
BUN: 9 mg/dL (ref 6–20)
CHLORIDE: 96 mmol/L — AB (ref 98–111)
CO2: 27 mmol/L (ref 22–32)
CREATININE: 0.56 mg/dL (ref 0.44–1.00)
Calcium: 9 mg/dL (ref 8.9–10.3)
GFR calc Af Amer: >60 mL/min (ref >60)
GFR calc non Af Amer: >60 mL/min (ref >60)
GLUCOSE: 107 mg/dL — AB (ref 70–99)
Potassium: 3.8 mmol/L (ref 3.5–5.1)
SODIUM: 135 mmol/L (ref 135–145)
Total Bilirubin: 0.9 mg/dL (ref 0.3–1.2)
Total Protein: 7.4 g/dL (ref 6.5–8.1)

## 2018-01-31 LAB — URINALYSIS, COMPLETE (UACMP) WITH MICROSCOPIC
BACTERIA UA: NONE SEEN
BILIRUBIN URINE: NEGATIVE
Glucose, UA: 150 mg/dL — AB
HGB URINE DIPSTICK: NEGATIVE
Ketones, ur: 80 mg/dL — AB
LEUKOCYTES UA: NEGATIVE
NITRITE: NEGATIVE
PROTEIN: NEGATIVE mg/dL
Specific Gravity, Urine: >1.046 — ABNORMAL HIGH (ref 1.005–1.030)
WBC UA: NONE SEEN WBC/hpf (ref 0–5)
pH: 6 (ref 5.0–8.0)

## 2018-01-31 LAB — LIPASE, BLOOD: LIPASE: 28 U/L (ref 11–51)

## 2018-01-31 LAB — CBC
HEMATOCRIT: 43.3 % (ref 36.0–46.0)
HEMOGLOBIN: 14 g/dL (ref 12.0–15.0)
MCH: 28.2 pg (ref 26.0–34.0)
MCHC: 32.3 g/dL (ref 30.0–36.0)
MCV: 87.1 fL (ref 80.0–100.0)
NRBC: 0 % (ref 0.0–0.2)
Platelets: 452 10*3/uL — ABNORMAL HIGH (ref 150–400)
RBC: 4.97 MIL/uL (ref 3.87–5.11)
RDW: 14.5 % (ref 11.5–15.5)
WBC: 7.1 10*3/uL (ref 4.0–10.5)

## 2018-01-31 LAB — INFLUENZA PANEL BY PCR (TYPE A & B)
INFLAPCR: NEGATIVE
Influenza B By PCR: NEGATIVE

## 2018-01-31 MED ORDER — KETOROLAC TROMETHAMINE 30 MG/ML IJ SOLN: 30.0000 mg | Freq: Four times a day (QID) | INTRAMUSCULAR | Status: DC | PRN

## 2018-01-31 MED ORDER — DEXTROSE-NACL 5-0.45 % IV SOLN
Freq: Once | INTRAVENOUS | Status: AC
  Administered 2018-01-31: 10:00:00 via INTRAVENOUS

## 2018-01-31 MED ORDER — ONDANSETRON HCL 4 MG/2ML IJ SOLN
4.0000 mg | Freq: Once | INTRAMUSCULAR | Status: AC
  Administered 2018-01-31: 4 mg via INTRAVENOUS
  Filled 2018-01-31: qty 2

## 2018-01-31 MED ORDER — KETOROLAC TROMETHAMINE 30 MG/ML IJ SOLN
30.0000 mg | Freq: Four times a day (QID) | INTRAMUSCULAR | Status: DC
  Administered 2018-01-31 – 2018-02-01 (×4): 30 mg via INTRAVENOUS
  Filled 2018-01-31 (×4): qty 1

## 2018-01-31 MED ORDER — PROMETHAZINE HCL 25 MG/ML IJ SOLN
25.0000 mg | Freq: Once | INTRAMUSCULAR | Status: AC
  Administered 2018-01-31: 25 mg via INTRAVENOUS

## 2018-01-31 MED ORDER — SODIUM CHLORIDE 0.9 % IV BOLUS
1000.0000 mL | Freq: Once | INTRAVENOUS | Status: AC
  Administered 2018-01-31: 1000 mL via INTRAVENOUS

## 2018-01-31 MED ORDER — ENOXAPARIN SODIUM 40 MG/0.4ML ~~LOC~~ SOLN
40.0000 mg | SUBCUTANEOUS | Status: DC
  Administered 2018-02-01 – 2018-02-05 (×4): 40 mg via SUBCUTANEOUS
  Filled 2018-01-31 (×4): qty 0.4

## 2018-01-31 MED ORDER — METOPROLOL TARTRATE 5 MG/5ML IV SOLN: 5.0000 mg | Freq: Four times a day (QID) | INTRAVENOUS | Status: DC | PRN

## 2018-01-31 MED ORDER — PANTOPRAZOLE SODIUM 40 MG IV SOLR
40.0000 mg | Freq: Every day | INTRAVENOUS | Status: DC
  Administered 2018-01-31: 40 mg via INTRAVENOUS
  Filled 2018-01-31: qty 40

## 2018-01-31 MED ORDER — PROMETHAZINE HCL 25 MG/ML IJ SOLN
INTRAMUSCULAR | Status: AC
  Filled 2018-01-31: qty 1

## 2018-01-31 MED ORDER — ONDANSETRON HCL 4 MG/2ML IJ SOLN
4.0000 mg | Freq: Once | INTRAMUSCULAR | Status: AC
  Administered 2018-01-31: 4 mg via INTRAVENOUS

## 2018-01-31 MED ORDER — FAMOTIDINE IN NACL 20-0.9 MG/50ML-% IV SOLN
20.0000 mg | Freq: Once | INTRAVENOUS | Status: AC
  Administered 2018-01-31: 20 mg via INTRAVENOUS
  Filled 2018-01-31: qty 50

## 2018-01-31 MED ORDER — ZOLPIDEM TARTRATE 5 MG PO TABS
5.0000 mg | ORAL_TABLET | Freq: Once | ORAL | Status: AC
  Administered 2018-01-31: 5 mg via ORAL
  Filled 2018-01-31: qty 1

## 2018-01-31 MED ORDER — DEXTROSE IN LACTATED RINGERS 5 % IV SOLN
INTRAVENOUS | Status: DC
  Administered 2018-01-31: 16:00:00 via INTRAVENOUS
  Administered 2018-02-01: 100 mL/h via INTRAVENOUS
  Administered 2018-02-01 – 2018-02-05 (×9): via INTRAVENOUS

## 2018-01-31 MED ORDER — ONDANSETRON 4 MG PO TBDP: 4.0000 mg | ORAL_TABLET | Freq: Four times a day (QID) | ORAL | Status: DC | PRN

## 2018-01-31 MED ORDER — ONDANSETRON HCL 4 MG/2ML IJ SOLN
4.0000 mg | Freq: Four times a day (QID) | INTRAMUSCULAR | Status: DC | PRN
  Administered 2018-01-31 – 2018-02-02 (×3): 4 mg via INTRAVENOUS
  Filled 2018-01-31 (×3): qty 2

## 2018-01-31 MED ORDER — KETOROLAC TROMETHAMINE 30 MG/ML IJ SOLN
15.0000 mg | INTRAMUSCULAR | Status: AC
  Administered 2018-01-31: 15 mg via INTRAVENOUS
  Filled 2018-01-31: qty 1

## 2018-01-31 MED ORDER — IOHEXOL 300 MG/ML  SOLN
100.0000 mL | Freq: Once | INTRAMUSCULAR | Status: AC | PRN
  Administered 2018-01-31: 100 mL via INTRAVENOUS

## 2018-01-31 MED ORDER — ONDANSETRON HCL 4 MG/2ML IJ SOLN
INTRAMUSCULAR | Status: AC
  Administered 2018-01-31: 4 mg via INTRAVENOUS
  Filled 2018-01-31: qty 2

## 2018-01-31 MED ORDER — MORPHINE SULFATE (PF) 2 MG/ML IV SOLN
2.0000 mg | INTRAVENOUS | Status: DC | PRN
  Administered 2018-01-31 – 2018-02-04 (×10): 2 mg via INTRAVENOUS
  Filled 2018-01-31 (×10): qty 1

## 2018-01-31 NOTE — ED Provider Notes (Signed)
[facility] Emergency Department Provider Note  ____________________________________________  Time seen: Approximately 1:02 PM  I have reviewed the triage vital signs and the nursing notes.   HISTORY  Chief Complaint Abdominal Pain and Emesis    HPI Anne James is a 47 y.o. female with a history of GERD, bariatric surgery, pulmonary embolism who complains of upper abdominal pain nausea and vomiting for the past week.  This is her third visit to the emergency room over the past week for the same symptoms.   The first 2 times, she had normal vital signs and labs.  She was given fluids and medications and felt somewhat better and discharged home.  However, on being home symptoms would worsen again.  She reports that symptoms are particularly bad today, she is had 7 episodes of vomiting since 2 AM.  Upper abdominal pain is nonradiating, left upper quadrant, moderate intensity, aching.  No aggravating or alleviating factors.     Past Medical History:  Diagnosis Date  . Anemia    low iron  . Anxiety   . 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 Chicken pox   . Depression   . Eating disorder   . GERD (gastroesophageal reflux disease)   . History of endometrial ablation 09/17/2016   In 2006. No periods since 2006.  [name]. 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 Hypertension   . Phlebitis   . Primary localized osteoarthritis of left knee   . Primary localized osteoarthritis of right knee 01/06/2016  . 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 Ear popping, left 10/10/2017  . H/O bariatric surgery 12/14/2016  . Iron deficiency anemia secondary to inadequate dietary iron intake 12/14/2016  . Hot flashes 09/12/2016  . Status post right knee replacement 03/02/2016  . Primary localized osteoarthritis of right knee  01/06/2016  . Neck pain 11/30/2015  . 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 Primary localized osteoarthritis of left knee   . Anxiety and depression 06/08/2015  . Chronic fatigue 07/02/2014  . Right hip pain 02/23/2012  . Osteoarthritis of both knees 10/17/2011  . Obesity (BMI 30-39.9) 10/17/2011  . History of pulmonary embolus (PE) 03/14/2000     Past Surgical History:  Procedure Laterality Date  . CHOLECYSTECTOMY N/A 09/18/2016   Procedure: LAPAROSCOPIC CHOLECYSTECTOMY;  Surgeon: Leafy RoPabon, Diego F, MD;  Location: [facility] ORS;  Service: General;  Laterality: N/A;  . GASTRIC BYPASS  2002   [facility], Dr. Kennedy BuckerGrant  . NOVASURE ABLATION    . TOTAL KNEE ARTHROPLASTY Left 06/29/2015   Procedure: LEFT TOTAL KNEE ARTHROPLASTY;  Surgeon: Salvatore Marvelobert Wainer, MD;  [facility];  Service: Orthopedics;  Laterality: Left;  . TOTAL KNEE ARTHROPLASTY Right 01/18/2016   Procedure: TOTAL KNEE ARTHROPLASTY;  Surgeon: Salvatore Marvelobert Wainer, MD;  [facility];  Service: Orthopedics;  Laterality: Right;  . TUBAL LIGATION       Prior to Admission medications   Medication Sig Start Date End Date Taking? Authorizing Provider  celecoxib (CELEBREX) 200 MG capsule Take 1 capsule (200 mg total) by mouth daily. 11/01/17  Yes [name], Eric G, MD  clonazePAM (KLONOPIN) 0.5 MG tablet Take 1 tablet (0.5 mg total) by mouth 2 (two) times daily as needed for anxiety. 01/19/18  Yes [name], Eric G, MD  famotidine (PEPCID) 20  MG tablet Take 1 tablet (20 mg total) by mouth 2 (two) times daily. 01/22/18  Yes Sharman Cheek, MD  FLUoxetine (PROZAC) 40 MG capsule TAKE 1 CAPSULE BY MOUTH  DAILY 12/06/17  Yes Glori Luis, MD  Multiple Vitamin (MULTIVITAMIN) capsule Take 1 capsule by mouth daily.   Yes [provider]  ondansetron (ZOFRAN ODT) 4 MG disintegrating tablet Take 1 tablet (4 mg total) by mouth every 8 (eight) hours as needed for nausea or vomiting. 01/30/18  Yes Don Perking, [place], MD   sucralfate (CARAFATE) 1 g tablet Take 1 tablet (1 g total) by mouth 4 (four) times daily. 01/22/18  Yes Sharman Cheek, MD  ferrous sulfate 325 (65 FE) MG EC tablet Take 1 tablet (325 mg total) by mouth 2 (two) times daily with a meal. Patient not taking: Reported on 01/31/2018 12/23/16   Glori Luis, MD  FLUoxetine (PROZAC) 20 MG tablet Take 1 tablet (20 mg total) by mouth daily. Patient not taking: Reported on 01/31/2018 10/02/17   Glori Luis, MD  fluticasone [facility]) 50 MCG/ACT nasal spray Place 2 sprays into both nostrils daily. Patient not taking: Reported on 01/31/2018 07/13/16   Glori Luis, MD     Allergies Patient has no known allergies.   Family History  Problem Relation Age of Onset  . Alcohol abuse Mother   . Drug abuse Mother   . CAD Mother   . Arthritis Maternal Grandmother   . Heart disease Maternal Grandmother   . Stroke Maternal Grandmother   . Hypertension Maternal Grandmother   . Kidney disease Maternal Grandmother   . Diabetes Maternal Grandmother   . Anxiety disorder Brother   . Hypertension Father     Social History Social History   Tobacco Use  . Smoking status: Never Smoker  . Smokeless tobacco: Never Used  Substance Use Topics  . Alcohol use: Not Currently  . Drug use: No    Review of Systems  Constitutional:   No fever or chills.  ENT:   No sore throat. No rhinorrhea. Cardiovascular:   No chest pain or syncope. Respiratory:   No dyspnea or cough. Gastrointestinal:   Positive as above for abdominal pain and vomiting.  No constipation.  Normal bowel movement yesterday..  Musculoskeletal:   Negative for focal pain or swelling All other systems reviewed and are negative except as documented above in ROS and HPI.  ____________________________________________   PHYSICAL EXAM:  VITAL SIGNS: ED Triage Vitals  Enc Vitals Group     BP 01/31/18 0832 (!) 176/93     Pulse Rate 01/31/18 0832 74     Resp 01/31/18 0832 18      Temp 01/31/18 0832 98.4 F (36.9 C)     Temp Source 01/31/18 0832 Oral     SpO2 01/31/18 0832 100 %     Weight 01/31/18 0833 200 lb (90.7 kg)     Height 01/31/18 0833 5\' 5"  (1.651 m)     Head Circumference --      Peak Flow --      Pain Score 01/31/18 0832 5     Pain Loc --      Pain Edu? --      Excl. in GC? --     Vital signs reviewed, nursing assessments reviewed.   Constitutional:   Alert and oriented.  Ill-appearing Eyes:   Conjunctivae are normal. EOMI. PERRL. ENT      Head:   Normocephalic and atraumatic.  Nose:   No congestion/rhinnorhea.       Mouth/Throat:   Dry mucous membranes, no pharyngeal erythema. No peritonsillar mass.       Neck:   No meningismus. Full ROM. Hematological/Lymphatic/Immunilogical:   No cervical lymphadenopathy. Cardiovascular:   RRR. Symmetric bilateral radial and DP pulses.  No murmurs. Cap refill less than 2 seconds. Respiratory:   Normal respiratory effort without tachypnea/retractions. Breath sounds are clear and equal bilaterally. No wheezes/rales/rhonchi. Gastrointestinal:   Soft with left upper quadrant and epigastric tenderness. Non distended. There is no CVA tenderness.  No rebound, rigidity, or guarding. Musculoskeletal:   Normal range of motion in all extremities. No joint effusions.  No lower extremity tenderness.  No edema. Neurologic:   Normal speech and language.  Motor grossly intact. No acute focal neurologic deficits are appreciated.  Skin:    Skin is warm, dry and intact. No rash noted.  No petechiae, purpura, or bullae.  ____________________________________________    LABS (pertinent positives/negatives) (all labs ordered are listed, but only abnormal results are displayed) Labs Reviewed  COMPREHENSIVE METABOLIC PANEL - Abnormal; Notable for the following components:      Result Value   Chloride 96 (*)    Glucose, Bld 107 (*)    All other components within normal limits  CBC - Abnormal; Notable for the  following components:   Platelets 452 (*)    All other components within normal limits  URINALYSIS, COMPLETE (UACMP) WITH MICROSCOPIC - Abnormal; Notable for the following components:   Color, Urine STRAW (*)    APPearance CLEAR (*)    Specific Gravity, Urine >1.046 (*)    Glucose, UA 150 (*)    Ketones, ur 80 (*)    All other components within normal limits  LIPASE, BLOOD  INFLUENZA PANEL BY PCR (TYPE A & B)  POC URINE PREG, ED   ____________________________________________   EKG    ____________________________________________    RADIOLOGY  Ct Abdomen Pelvis W Contrast  Addendum Date: 01/31/2018   ADDENDUM REPORT: 01/31/2018 12:25 ADDENDUM: No internal hernia is delineated on this current examination. Given history of previous gastric bypass grafting, this patient is at increased risk for internal hernia development. If symptoms persist, repeat CT potentially could be helpful to further delineate in this regard. It is possible for bowel to intermittently become entrapped within an internal hernia in a circumstance of this nature. I discussed this case by phone on 01/31/2018 at 12:24 pm with Dr. Everlene Farrier, general surgery, who verbally acknowledged these results. Electronically Signed   By: Bretta Bang III M.D.   On: 01/31/2018 12:25   Result Date: 01/31/2018 CLINICAL DATA:  Generalized abdominal pain.  In the media. EXAM: CT ABDOMEN AND PELVIS WITH CONTRAST TECHNIQUE: Multidetector CT imaging of the abdomen and pelvis was performed using the standard protocol following bolus administration of intravenous contrast. Oral contrast was also administered. CONTRAST:  OMNIPAQUE IOHEXOL 300 MG/ML  SOLN COMPARISON:  September 22, 2016 FINDINGS: Lower chest: There is scarring in the posterior right lung base. There is no lung base edema or consolidation. Hepatobiliary: No focal liver lesions are evident. Fatty infiltration is noted near the fissure for the ligamentum teres. Gallbladder is  absent. There is mild intrahepatic biliary duct dilatation. Common bile duct does not appear appreciably enlarged. No biliary duct mass or calculus is appreciable by CT. Pancreas: There is a 9 x 9 mm focus of decreased attenuation in the body of the pancreas, best seen on axial slice 13 series 7.  No other evident pancreatic mass. No inflammatory focus evident. Pancreatic duct upper normal in size. Spleen: No splenic lesions are evident. Adrenals/Urinary Tract: Adrenals bilaterally appear unremarkable. There is a cyst arising from the medial mid right kidney measuring 1.8 x 1.6 cm. There is an apparent cyst in the medial mid left kidney measuring 1.1 x 1.0 cm. There is no appreciable hydronephrosis on either side. There is no evident renal or ureteral calculus on either side. Urinary bladder is midline with wall thickness within normal limits. Stomach/Bowel: The patient is status post gastric bypass procedure. There is no perioperative wall thickening or abnormal fluid. Elsewhere, there is no appreciable bowel wall or mesenteric thickening. There is a focal area intussusception involving a loop of jejunum in the left mid abdomen without appreciable fluid or wall thickening in this area. No similar changes elsewhere. There is no appreciable bowel obstruction. There is no evident free air or portal venous air. Vascular/Lymphatic: No abdominal aortic aneurysm. Aorta is mildly tortuous. No focal vascular lesions are evident. There is no demonstrable adenopathy in the abdomen or pelvis. Reproductive: Uterus is anteverted. The uterus is inhomogeneous in appearance, felt to represent underlying leiomyomatous change. There is a cystic area arising from the right ovary measuring 3.4 x 3.4 x 3.4 cm. No other extrauterine pelvic mass evident. Other: Appendix appears unremarkable. No abscess or ascites is evident in the abdomen or pelvis. Musculoskeletal: There is extensive degenerative change at L3-4, stable. No blastic or  lytic bone lesions are evident. No intramuscular or abdominal wall lesions are appreciable. IMPRESSION: 1. Focal area of intussusception involving a loop of jejunum in the left lateral abdomen without bowel obstruction. No bowel wall thickening in this area. Significance of this finding is uncertain. 2. 9 mm area of decreased attenuation in the body of the pancreas consistent with a small mass. Nonemergent MR of the pancreas pre and post-contrast advised to further evaluate. 3. Status post gastric bypass procedure without complicating features appreciable on this study. 4. Gallbladder absent. Mild intrahepatic biliary duct dilatation. No biliary duct mass or calculus evident. 5. No evident abscess in the abdomen or pelvis. Appendix region appears normal. 6. Apparent leiomyomatous change within the uterus. Cystic area arising from right ovary measuring 3.4 x 3.4 x 3.4 cm. Suspect dominant follicle. Electronically Signed: By: Bretta Bang III M.D. On: 01/31/2018 10:19   Dg Abdomen Acute W/chest  Result Date: 01/30/2018 CLINICAL DATA:  Recurrent abdominal pain, nausea and vomiting. Recent diagnosis of gastritis and gastric bypass. EXAM: DG ABDOMEN ACUTE W/ 1V CHEST COMPARISON:  Chest radiograph September 23, 2016 FINDINGS: Cardiomediastinal silhouette is normal. Lungs are clear, no pleural effusions. No pneumothorax. Soft tissue planes and included osseous structures are unremarkable. Bowel gas pattern is nondilated and nonobstructive. Surgical clips in the included right abdomen compatible with cholecystectomy. Surgical stable LEFT abdomen compatible with history of gastric bypass. But no intra-abdominal mass effect, pathologic calcifications or free air. Soft tissue planes and included osseous structures are non-suspicious. Lumbar levoscoliosis. IMPRESSION: Normal bowel gas pattern.  No acute cardiopulmonary disease. Electronically Signed   By: Awilda Metro M.D.   On: 01/30/2018 18:01     ____________________________________________   PROCEDURES Procedures  ____________________________________________  DIFFERENTIAL DIAGNOSIS   Bowel perforation, obstruction, pancreatitis, internal hernia  CLINICAL IMPRESSION / ASSESSMENT AND PLAN / ED COURSE  Pertinent labs & imaging results that were available during my care of the patient were reviewed by me and considered in my medical decision making (see chart for details).  Patient with prior bariatric surgery with intractable abdominal pain and vomiting.  This is her third ED visit.  Despite normal vital signs and reassuring lab work, due to the persistent symptoms and concerning exam, we will obtain a CT scan to further evaluate for occult intra-abdominal pathology.  Clinical Course as of Jan 31 1301  Wed Jan 31, 2018  1056 CT shows jejunal intussusception, consistent with the patient's intractable vomiting and pain.  Will page GI for evaluation.   [PS]  1114 Patient updated.  Pain 3/10, still nauseated.   [PS]  1121 D/w GI Dr. Norma Fredrickson, advises GI /EGD unable to assist with this issue. Will page gen. Surg.    [PS]  1123 Discussed with surgery who will evaluate.   [PS]  1201 UA consistent with dehydration and starvation due to her symptoms.  Urinalysis, Complete w Microscopic(!) [PS]  1301 D/w gen surg, will plan to admit   [PS]    Clinical Course User Index [PS] Sharman Cheek, MD     ____________________________________________   FINAL CLINICAL IMPRESSION(S) / ED DIAGNOSES    Final diagnoses:  Intussusception of jejunum (HCC)  Intractable vomiting with nausea, unspecified vomiting type     ED Discharge Orders    None      Portions of this note were generated with dragon dictation software. Dictation errors may occur despite best attempts at proofreading.    Sharman Cheek, MD 01/31/18 [id number]

## 2018-01-31 NOTE — H&P (Signed)
[facility] Admission Note  Anne James 10/23/1970  [id number].    Requesting MD: Dr Carrie Mew, MD Chief Complaint/Reason for Consult: Abdominal Pain (Intussusecption vs Internal hernia vs Marginal Ulceration)  HPI:  Anne James is a 47 y.o. female who presents to [facility] ED today with 1 week of diffuse abdominal pain. She notes that she has had about 1 week of constant waxing and waning cramping abdominal pain. Nothing has seemed to make the pain better or worse. She has associated nausea, emesis, and decreased PO intake with the abdominal pain. No complaints of fever, chills, SOB, CP, or melena/hematochezia. Her abdominal surgical history is remarkable for gastric bypass in 2002 and cholecystectomy in 2018 with Dr. Dahlia Byes. She was seen for this complaint in the ED twice in the last week and diagnosed with gastritis and managed accordingly. Work up in the ED today was concerning for possible intussusception involving a loop of jejunum.   General surgery is consulted by emergency medicine physician Dr. Carrie Mew, MD for evaluation and management of possible jejunal intussusception.     ROS: Review of Systems  Constitutional: Negative for chills and fever.       + Decreased Appetite  Respiratory: Negative for cough and shortness of breath.   Cardiovascular: Negative for chest pain, palpitations and leg swelling.  Gastrointestinal: Positive for abdominal pain, diarrhea, nausea and vomiting. Negative for blood in stool.  Genitourinary: Negative for dysuria and urgency.  Musculoskeletal: Negative for myalgias.  Skin: Negative for rash.  All other systems reviewed and are negative.   Family History  Problem Relation Age of Onset  . Alcohol abuse Mother   . Drug abuse Mother   . CAD Mother   . Arthritis Maternal Grandmother   . Heart disease Maternal Grandmother   . Stroke Maternal Grandmother   . Hypertension Maternal Grandmother   . Kidney disease  Maternal Grandmother   . Diabetes Maternal Grandmother   . Anxiety disorder Brother   . Hypertension Father     Past Medical History:  Diagnosis Date  . Anemia    low iron  . Anxiety   . 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 Chicken pox   . Depression   . Eating disorder   . GERD (gastroesophageal reflux disease)   . History of endometrial ablation 09/17/2016   In 2006. No periods since 2006.  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 Hypertension   . Phlebitis   . Primary localized osteoarthritis of left knee   . Primary localized osteoarthritis of right knee 01/06/2016  . Pulmonary embolism ([place])    after gastric bypass  . 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 CHOLECYSTECTOMY N/A 09/18/2016   Procedure: LAPAROSCOPIC CHOLECYSTECTOMY;  Surgeon: Jules Husbands, MD;  Location: [facility] ORS;  Service: General;  Laterality: N/A;  . GASTRIC BYPASS  2002   [facility], Dr. Fatima Sanger  . NOVASURE ABLATION    . TOTAL KNEE ARTHROPLASTY Left 06/29/2015   Procedure: LEFT TOTAL KNEE ARTHROPLASTY;  Surgeon: Elsie Saas, MD;  Location: [place];  Service: Orthopedics;  Laterality: Left;  . TOTAL KNEE ARTHROPLASTY Right 01/18/2016   Procedure: TOTAL KNEE ARTHROPLASTY;  Surgeon: Elsie Saas, MD;  Location: [place];  Service: Orthopedics;  Laterality: Right;  . TUBAL LIGATION      Social History:  reports that she has never smoked. She has  never used smokeless tobacco. She reports that she drank alcohol. She reports that she does not use drugs.  Allergies: No Known Allergies   (Not in a hospital admission)  Blood pressure (!) 156/97, pulse 79, temperature 98.4 F (36.9 C), temperature source Oral, resp. rate 18, height 5' 5" (1.651 m), weight 90.7 kg, SpO2 100 %. Physical Exam: Physical Exam  Constitutional: She appears well-developed and well-nourished.  Non-toxic appearance. She does not  appear ill.  Somnolent but arouses easily to verbal stimuli  HENT:  Head: Normocephalic and atraumatic.  Eyes: EOM are normal. No scleral icterus.  Cardiovascular: Normal rate, regular rhythm and normal heart sounds. Exam reveals no gallop and no friction rub.  No murmur heard. Pulmonary/Chest: Effort normal and breath sounds normal. No respiratory distress. She has no wheezes. She has no rhonchi. She has no rales.  Abdominal: Soft. Normal appearance. There is generalized tenderness. There is no rigidity, no rebound and no guarding. No hernia.  Genitourinary:  Genitourinary Comments: Deferred   Neurological: She is alert.  Skin: Skin is warm and dry.  Psychiatric: She has a normal mood and affect. Her behavior is normal.    Results for orders placed or performed during the hospital encounter of 01/31/18 (from the past 48 hour(s))  Lipase, blood     Status: None   Collection Time: 01/31/18  8:41 AM  Result Value Ref Range   Lipase 28 11 - 51 U/L    Comment: Performed at [facility], [address] [place], [place] [address]  Comprehensive metabolic panel     Status: Abnormal   Collection Time: 01/31/18  8:41 AM  Result Value Ref Range   Sodium 135 135 - 145 mmol/L   Potassium 3.8 3.5 - 5.1 mmol/L    Comment: HEMOLYSIS AT THIS LEVEL MAY AFFECT RESULT   Chloride 96 (L) 98 - 111 mmol/L   CO2 27 22 - 32 mmol/L   Glucose, Bld 107 (H) 70 - 99 mg/dL   BUN 9 6 - 20 mg/dL   Creatinine, Ser 0.56 0.44 - 1.00 mg/dL   Calcium 9.0 8.9 - 10.3 mg/dL   Total Protein 7.4 6.5 - 8.1 g/dL   Albumin 3.9 3.5 - 5.0 g/dL   AST 24 15 - 41 U/L   ALT 15 0 - 44 U/L   Alkaline Phosphatase 64 38 - 126 U/L   Total Bilirubin 0.9 0.3 - 1.2 mg/dL   GFR calc non Af Amer >60 >60 mL/min   GFR calc Af Amer >60 >60 mL/min    Comment: (NOTE) The eGFR has been calculated using the CKD EPI equation. This calculation has not been validated in all clinical situations. eGFR's persistently <60 mL/min  signify possible Chronic Kidney Disease.    Anion gap 12 5 - 15    Comment: Performed at [facility], [address] [place], [place] [address]  CBC     Status: Abnormal   Collection Time: 01/31/18  8:41 AM  Result Value Ref Range   WBC 7.1 4.0 - 10.5 K/uL   RBC 4.97 3.87 - 5.11 MIL/uL   Hemoglobin 14.0 12.0 - 15.0 g/dL   HCT 43.3 36.0 - 46.0 %   MCV 87.1 80.0 - 100.0 fL   MCH 28.2 26.0 - 34.0 pg   MCHC 32.3 30.0 - 36.0 g/dL   RDW 14.5 11.5 - 15.5 %   Platelets 452 (H) 150 - 400 K/uL   nRBC 0.0 0.0 - 0.2 %  Comment: Performed at [facility], [address] [place], [place] [address]  Urinalysis, Complete w Microscopic     Status: Abnormal   Collection Time: 01/31/18  8:41 AM  Result Value Ref Range   Color, Urine STRAW (A) YELLOW   APPearance CLEAR (A) CLEAR   Specific Gravity, Urine >1.046 (H) 1.005 - 1.030   pH 6.0 5.0 - 8.0   Glucose, UA 150 (A) NEGATIVE mg/dL   Hgb urine dipstick NEGATIVE NEGATIVE   Bilirubin Urine NEGATIVE NEGATIVE   Ketones, ur 80 (A) NEGATIVE mg/dL   Protein, ur NEGATIVE NEGATIVE mg/dL   Nitrite NEGATIVE NEGATIVE   Leukocytes, UA NEGATIVE NEGATIVE   RBC / HPF 0-5 0 - 5 RBC/hpf   WBC, UA NONE SEEN 0 - 5 WBC/hpf   Bacteria, UA NONE SEEN NONE SEEN   Squamous Epithelial / LPF 0-5 0 - 5   Mucus PRESENT     Comment: Performed at [facility], [address], [place], [place] [address]  Influenza panel by PCR (type A & B)     Status: None   Collection Time: 01/31/18 10:02 AM  Result Value Ref Range   Influenza A By PCR NEGATIVE NEGATIVE   Influenza B By PCR NEGATIVE NEGATIVE    Comment: (NOTE) The Xpert Xpress Flu assay is intended as an aid in the diagnosis of  influenza and should not be used as a sole basis for treatment.  This  assay is FDA approved for nasopharyngeal swab specimens only. Nasal  washings and aspirates are unacceptable for Xpert Xpress Flu testing. Performed at [facility], [address] [place], [place] [address]    Ct Abdomen Pelvis W Contrast  Addendum Date: 01/31/2018   ADDENDUM REPORT: 01/31/2018 12:25 ADDENDUM: No internal hernia is delineated on this current examination. Given history of previous gastric bypass grafting, this patient is at increased risk for internal hernia development. If symptoms persist, repeat CT potentially could be helpful to further delineate in this regard. It is possible for bowel to intermittently become entrapped within an internal hernia in a circumstance of this nature. I discussed this case by phone on 01/31/2018 at 12:24 pm with Dr. Dahlia Byes, general surgery, who verbally acknowledged these results. Electronically Signed   By: Lowella Grip III M.D.   On: 01/31/2018 12:25   Result Date: 01/31/2018 CLINICAL DATA:  Generalized abdominal pain.  In the media. EXAM: CT ABDOMEN AND PELVIS WITH CONTRAST TECHNIQUE: Multidetector CT imaging of the abdomen and pelvis was performed using the standard protocol following bolus administration of intravenous contrast. Oral contrast was also administered. CONTRAST:  1109m OMNIPAQUE IOHEXOL 300 MG/ML  SOLN COMPARISON:  September 22, 2016 FINDINGS: Lower chest: There is scarring in the posterior right lung base. There is no lung base edema or consolidation. Hepatobiliary: No focal liver lesions are evident. Fatty infiltration is noted near the fissure for the ligamentum teres. Gallbladder is absent. There is mild intrahepatic biliary duct dilatation. Common bile duct does not appear appreciably enlarged. No biliary duct mass or calculus is appreciable by CT. Pancreas: There is a 9 x 9 mm focus of decreased attenuation in the body of the pancreas, best seen on axial slice 13 series 7. No other evident pancreatic mass. No inflammatory focus evident. Pancreatic duct upper normal in size. Spleen: No splenic lesions are evident. Adrenals/Urinary Tract: Adrenals bilaterally appear unremarkable. There is a cyst arising from  the medial mid right kidney measuring 1.8 x 1.6 cm. There is  an apparent cyst in the medial mid left kidney measuring 1.1 x 1.0 cm. There is no appreciable hydronephrosis on either side. There is no evident renal or ureteral calculus on either side. Urinary bladder is midline with wall thickness within normal limits. Stomach/Bowel: The patient is status post gastric bypass procedure. There is no perioperative wall thickening or abnormal fluid. Elsewhere, there is no appreciable bowel wall or mesenteric thickening. There is a focal area intussusception involving a loop of jejunum in the left mid abdomen without appreciable fluid or wall thickening in this area. No similar changes elsewhere. There is no appreciable bowel obstruction. There is no evident free air or portal venous air. Vascular/Lymphatic: No abdominal aortic aneurysm. Aorta is mildly tortuous. No focal vascular lesions are evident. There is no demonstrable adenopathy in the abdomen or pelvis. Reproductive: Uterus is anteverted. The uterus is inhomogeneous in appearance, felt to represent underlying leiomyomatous change. There is a cystic area arising from the right ovary measuring 3.4 x 3.4 x 3.4 cm. No other extrauterine pelvic mass evident. Other: Appendix appears unremarkable. No abscess or ascites is evident in the abdomen or pelvis. Musculoskeletal: There is extensive degenerative change at L3-4, stable. No blastic or lytic bone lesions are evident. No intramuscular or abdominal wall lesions are appreciable. IMPRESSION: 1. Focal area of intussusception involving a loop of jejunum in the left lateral abdomen without bowel obstruction. No bowel wall thickening in this area. Significance of this finding is uncertain. 2. 9 mm area of decreased attenuation in the body of the pancreas consistent with a small mass. Nonemergent MR of the pancreas pre and post-contrast advised to further evaluate. 3. Status post gastric bypass procedure without complicating  features appreciable on this study. 4. Gallbladder absent. Mild intrahepatic biliary duct dilatation. No biliary duct mass or calculus evident. 5. No evident abscess in the abdomen or pelvis. Appendix region appears normal. 6. Apparent leiomyomatous change within the uterus. Cystic area arising from right ovary measuring 3.4 x 3.4 x 3.4 cm. Suspect dominant follicle. Electronically Signed: By: Lowella Grip III M.D. On: 01/31/2018 10:19   Dg Abdomen Acute W/chest  Result Date: 01/30/2018 CLINICAL DATA:  Recurrent abdominal pain, nausea and vomiting. Recent diagnosis of gastritis and gastric bypass. EXAM: DG ABDOMEN ACUTE W/ 1V CHEST COMPARISON:  Chest radiograph September 23, 2016 FINDINGS: Cardiomediastinal silhouette is normal. Lungs are clear, no pleural effusions. No pneumothorax. Soft tissue planes and included osseous structures are unremarkable. Bowel gas pattern is nondilated and nonobstructive. Surgical clips in the included right abdomen compatible with cholecystectomy. Surgical stable LEFT abdomen compatible with history of gastric bypass. But no intra-abdominal mass effect, pathologic calcifications or free air. Soft tissue planes and included osseous structures are non-suspicious. Lumbar levoscoliosis. IMPRESSION: Normal bowel gas pattern.  No acute cardiopulmonary disease. Electronically Signed   By: Elon Alas M.D.   On: 01/30/2018 18:01      Assessment/Plan  Generalized Abdominal Pain (Intussusecption vs Internal hernia vs Marginal Ulceration) Anne James is a 47 y.o. female with generalized abdominal which is concerning for intussusception vs internal herniation vs marginal ulceration based on presentation, imaging findings, and surgical history which is complicated by pertinent comorbidities including iron deficiency anemia, generalized anxiety and depression, HTN, history of pulmonary embolism following gastric bypass in 2002, and obesity.    - Admit to general surgery  -  NPO, IVF  - Pain Control PRN, Anti-Emetics PRN  - Monitor abdominal examination and ongoing bowel function  - Will consult GI for  possible EGD to evaluate for possible marginal ulceration with history of gastric bypass.   - Will consider diagnostic laparoscopy with possible laparotomy after reassessment tomorrow.   - Mobilize  - DVT Prophylaxis  -- Edison Simon, PA-C [facility] 01/31/2018, 1:04 PM [phone] M-F: 7am - 4pm

## 2018-01-31 NOTE — ED Notes (Signed)
Pt taken to CT, will administer remaining medications and swab for flu when pt returns.

## 2018-01-31 NOTE — ED Notes (Signed)
Pt reports pain and nausea. Pt assisted to bathroom and urine sent. MD in room to update pt.

## 2018-01-31 NOTE — ED Triage Notes (Signed)
Pt via pov from home with abdominal pain and emesis x 1 week. States this is her 3rd visit for the same this week. Was diagnosed with gastritis and is not getting better. States she has had 7x emesis since 0200. Pt alert & oriented; nad noted.

## 2018-01-31 NOTE — H&P (View-Only) (Signed)
[facility] GI Inpatient Consult Note   Jamey Reas, M.D.  Reason for Consult: Abdominal pain, nausea, vomiting   Attending Requesting Consult: Sterling Big, MD,General Surgery  Outpatient Primary Physician: Marikay Alar, M.D.  History of Present Illness: Anne James is a 47 y.o. female with a history of morbid obesity status post gastric bypass in 2002 followed by cholecystectomy 2018 presenting for 1 week history of progressive and intermittent epigastric pain with nausea and vomiting.  Patient was seen a total of about 3 times in the emergency room and admitted today after the latest visit due to intractable pain and vomiting.  Patient CT scan showed presumed small bowel intussusception in the jejunum along with possible internal hernia.  The patient admits to daily use of BC powders, however, and it was thought possible the patient is well could have a marginal ulcer and GI was consulted.  Past Medical History:  Past Medical History:  Diagnosis Date  . Anemia    low iron  . Anxiety   . 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 Chicken pox   . Depression   . Eating disorder   . GERD (gastroesophageal reflux disease)   . History of endometrial ablation 09/17/2016   In 2006. No periods since 2006.  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 Hypertension   . Phlebitis   . Primary localized osteoarthritis of left knee   . Primary localized osteoarthritis of right knee 01/06/2016  . 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 Intussusception of jejunum (HCC) 01/31/2018  . Ear popping, left 10/10/2017  . H/O bariatric surgery 12/14/2016  . Iron deficiency anemia secondary to inadequate dietary iron intake 12/14/2016  . Hot flashes 09/12/2016  . Status post right knee replacement 03/02/2016  . Primary  localized osteoarthritis of right knee 01/06/2016  . Neck pain 11/30/2015  . 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 Primary localized osteoarthritis of left knee   . Anxiety and depression 06/08/2015  . Chronic fatigue 07/02/2014  . Right hip pain 02/23/2012  . Osteoarthritis of both knees 10/17/2011  . Obesity (BMI 30-39.9) 10/17/2011  . History of pulmonary embolus (PE) 03/14/2000    Past Surgical History: Past Surgical History:  Procedure Laterality Date  . CHOLECYSTECTOMY N/A 09/18/2016   Procedure: LAPAROSCOPIC CHOLECYSTECTOMY;  Surgeon: Leafy Ro, MD;  Location: [facility] ORS;  Service: General;  Laterality: N/A;  . GASTRIC BYPASS  2002   [facility], Dr. Kennedy Bucker  . NOVASURE ABLATION    . TOTAL KNEE ARTHROPLASTY Left 06/29/2015   Procedure: LEFT TOTAL KNEE ARTHROPLASTY;  Surgeon: Salvatore Marvel, MD;  Location: [facility] OR;  Service: Orthopedics;  Laterality: Left;  . TOTAL KNEE ARTHROPLASTY Right 01/18/2016   Procedure: TOTAL KNEE ARTHROPLASTY;  Surgeon: Salvatore Marvel, MD;  Location: [facility] OR;  Service: Orthopedics;  Laterality: Right;  . TUBAL LIGATION      Allergies: No Known Allergies  Home Medications: Medications Prior to Admission  Medication Sig Dispense Refill Last Dose  . celecoxib (CELEBREX) 200 MG capsule Take 1 capsule (200 mg total) by mouth daily. 90 capsule 0 01/30/2018 at 0800  . clonazePAM (KLONOPIN) 0.5 MG tablet Take 1 tablet (0.5 mg total) by mouth 2 (two) times daily as needed for anxiety. 60 tablet 0 prn at prn  .  famotidine (PEPCID) 20 MG tablet Take 1 tablet (20 mg total) by mouth 2 (two) times daily. 60 tablet 0 01/30/2018 at 0800  . FLUoxetine (PROZAC) 40 MG capsule TAKE 1 CAPSULE BY MOUTH  DAILY 90 capsule 1 01/30/2018 at 0800  . Multiple Vitamin (MULTIVITAMIN) capsule Take 1 capsule by mouth daily.   01/30/2018 at 0800  . ondansetron (ZOFRAN ODT) 4 MG disintegrating tablet Take 1 tablet (4 mg total) by mouth every 8 (eight) hours as needed for nausea  or vomiting. 20 tablet 0 prn at prn  . sucralfate (CARAFATE) 1 g tablet Take 1 tablet (1 g total) by mouth 4 (four) times daily. 120 tablet 1 01/31/2018 at 0800  . ferrous sulfate 325 (65 FE) MG EC tablet Take 1 tablet (325 mg total) by mouth 2 (two) times daily with a meal. (Patient not taking: Reported on 01/31/2018) 60 tablet 3 Not Taking at Unknown time  . FLUoxetine (PROZAC) 20 MG tablet Take 1 tablet (20 mg total) by mouth daily. (Patient not taking: Reported on 01/31/2018) 30 tablet 3 Not Taking at 0800  . fluticasone (FLONASE) 50 MCG/ACT nasal spray Place 2 sprays into both nostrils daily. (Patient not taking: Reported on 01/31/2018) 16 g 6 Not Taking at Unknown time   Home medication reconciliation was completed with the patient.   Scheduled Inpatient Medications:   . enoxaparin (LOVENOX) injection  40 mg Subcutaneous Q24H  . ketorolac  30 mg Intravenous Q6H  . pantoprazole (PROTONIX) IV  40 mg Intravenous QHS    Continuous Inpatient Infusions:   . dextrose 5% lactated ringers Stopped (01/31/18 1729)    PRN Inpatient Medications:  ketorolac **FOLLOWED BY** [START ON 02/05/2018] ketorolac, metoprolol tartrate, morphine injection, ondansetron **OR** ondansetron (ZOFRAN) IV  Family History: family history includes Alcohol abuse in her mother; Anxiety disorder in her brother; Arthritis in her maternal grandmother; CAD in her mother; Diabetes in her maternal grandmother; Drug abuse in her mother; Heart disease in her maternal grandmother; Hypertension in her father and maternal grandmother; Kidney disease in her maternal grandmother; Stroke in her maternal grandmother.   GI Family History: Negative  Social History:   reports that she has never smoked. She has never used smokeless tobacco. She reports that she drank alcohol. She reports that she does not use drugs. The patient denies ETOH, tobacco, or drug use.    Review of Systems: Review of Systems - Negative except That history  of present illness  Physical Examination: BP (!) 166/92 (BP Location: Right Arm)   Pulse 69   Temp 99 F (37.2 C)   Resp 18   Ht 5\' 5"  (1.651 m)   Wt 90.7 kg   SpO2 100%   BMI 33.28 kg/m  Physical Exam  Constitutional: She is oriented to person, place, and time. She appears well-developed and well-nourished.  Non-toxic appearance. She appears ill. No distress.  HENT:  Head: Normocephalic and atraumatic.  Eyes: Pupils are equal, round, and reactive to light.  Cardiovascular: Normal rate and regular rhythm.  Pulmonary/Chest: Effort normal and breath sounds normal.  Abdominal: Normal appearance and bowel sounds are normal. There is tenderness in the periumbilical area. There is no rigidity, no rebound and no guarding. No hernia.  Neurological: She is alert and oriented to person, place, and time.  Skin: Skin is warm and dry. Capillary refill takes less than 2 seconds.    Data: Lab Results  Component Value Date   WBC 7.1 01/31/2018   HGB 14.0 01/31/2018  HCT 43.3 01/31/2018   MCV 87.1 01/31/2018   PLT 452 (H) 01/31/2018   Recent Labs  Lab 01/30/18 1433 01/31/18 0841  HGB 14.1 14.0   Lab Results  Component Value Date   NA 135 01/31/2018   K 3.8 01/31/2018   CL 96 (L) 01/31/2018   CO2 27 01/31/2018   BUN 9 01/31/2018   CREATININE 0.56 01/31/2018   Lab Results  Component Value Date   ALT 15 01/31/2018   AST 24 01/31/2018   ALKPHOS 64 01/31/2018   BILITOT 0.9 01/31/2018   No results for input(s): APTT, INR, PTT in the last 168 hours. CBC Latest Ref Rng & Units 01/31/2018 01/30/2018 01/21/2018  WBC 4.0 - 10.5 K/uL 7.1 7.7 9.9  Hemoglobin 12.0 - 15.0 g/dL 98.1 19.1 47.8  Hematocrit 36.0 - 46.0 % 43.3 45.0 42.3  Platelets 150 - 400 K/uL 452(H) 402(H) 322    STUDIES: Ct Abdomen Pelvis W Contrast  Addendum Date: 01/31/2018   ADDENDUM REPORT: 01/31/2018 12:25 ADDENDUM: No internal hernia is delineated on this current examination. Given history of previous  gastric bypass grafting, this patient is at increased risk for internal hernia development. If symptoms persist, repeat CT potentially could be helpful to further delineate in this regard. It is possible for bowel to intermittently become entrapped within an internal hernia in a circumstance of this nature. I discussed this case by phone on 01/31/2018 at 12:24 pm with Dr. Everlene Farrier, general surgery, who verbally acknowledged these results. Electronically Signed   By: Bretta Bang III M.D.   On: 01/31/2018 12:25   Result Date: 01/31/2018 CLINICAL DATA:  Generalized abdominal pain.  In the media. EXAM: CT ABDOMEN AND PELVIS WITH CONTRAST TECHNIQUE: Multidetector CT imaging of the abdomen and pelvis was performed using the standard protocol following bolus administration of intravenous contrast. Oral contrast was also administered. CONTRAST:  OMNIPAQUE IOHEXOL 300 MG/ML  SOLN COMPARISON:  September 22, 2016 FINDINGS: Lower chest: There is scarring in the posterior right lung base. There is no lung base edema or consolidation. Hepatobiliary: No focal liver lesions are evident. Fatty infiltration is noted near the fissure for the ligamentum teres. Gallbladder is absent. There is mild intrahepatic biliary duct dilatation. Common bile duct does not appear appreciably enlarged. No biliary duct mass or calculus is appreciable by CT. Pancreas: There is a 9 x 9 mm focus of decreased attenuation in the body of the pancreas, best seen on axial slice 13 series 7. No other evident pancreatic mass. No inflammatory focus evident. Pancreatic duct upper normal in size. Spleen: No splenic lesions are evident. Adrenals/Urinary Tract: Adrenals bilaterally appear unremarkable. There is a cyst arising from the medial mid right kidney measuring 1.8 x 1.6 cm. There is an apparent cyst in the medial mid left kidney measuring 1.1 x 1.0 cm. There is no appreciable hydronephrosis on either side. There is no evident renal or ureteral  calculus on either side. Urinary bladder is midline with wall thickness within normal limits. Stomach/Bowel: The patient is status post gastric bypass procedure. There is no perioperative wall thickening or abnormal fluid. Elsewhere, there is no appreciable bowel wall or mesenteric thickening. There is a focal area intussusception involving a loop of jejunum in the left mid abdomen without appreciable fluid or wall thickening in this area. No similar changes elsewhere. There is no appreciable bowel obstruction. There is no evident free air or portal venous air. Vascular/Lymphatic: No abdominal aortic aneurysm. Aorta is mildly tortuous. No focal vascular lesions  are evident. There is no demonstrable adenopathy in the abdomen or pelvis. Reproductive: Uterus is anteverted. The uterus is inhomogeneous in appearance, felt to represent underlying leiomyomatous change. There is a cystic area arising from the right ovary measuring 3.4 x 3.4 x 3.4 cm. No other extrauterine pelvic mass evident. Other: Appendix appears unremarkable. No abscess or ascites is evident in the abdomen or pelvis. Musculoskeletal: There is extensive degenerative change at L3-4, stable. No blastic or lytic bone lesions are evident. No intramuscular or abdominal wall lesions are appreciable. IMPRESSION: 1. Focal area of intussusception involving a loop of jejunum in the left lateral abdomen without bowel obstruction. No bowel wall thickening in this area. Significance of this finding is uncertain. 2. 9 mm area of decreased attenuation in the body of the pancreas consistent with a small mass. Nonemergent MR of the pancreas pre and post-contrast advised to further evaluate. 3. Status post gastric bypass procedure without complicating features appreciable on this study. 4. Gallbladder absent. Mild intrahepatic biliary duct dilatation. No biliary duct mass or calculus evident. 5. No evident abscess in the abdomen or pelvis. Appendix region appears normal.  6. Apparent leiomyomatous change within the uterus. Cystic area arising from right ovary measuring 3.4 x 3.4 x 3.4 cm. Suspect dominant follicle. Electronically Signed: By: Bretta BangWilliam  Woodruff III M.D. On: 01/31/2018 10:19   Dg Abdomen Acute W/chest  Result Date: 01/30/2018 CLINICAL DATA:  Recurrent abdominal pain, nausea and vomiting. Recent diagnosis of gastritis and gastric bypass. EXAM: DG ABDOMEN ACUTE W/ 1V CHEST COMPARISON:  Chest radiograph September 23, 2016 FINDINGS: Cardiomediastinal silhouette is normal. Lungs are clear, no pleural effusions. No pneumothorax. Soft tissue planes and included osseous structures are unremarkable. Bowel gas pattern is nondilated and nonobstructive. Surgical clips in the included right abdomen compatible with cholecystectomy. Surgical stable LEFT abdomen compatible with history of gastric bypass. But no intra-abdominal mass effect, pathologic calcifications or free air. Soft tissue planes and included osseous structures are non-suspicious. Lumbar levoscoliosis. IMPRESSION: Normal bowel gas pattern.  No acute cardiopulmonary disease. Electronically Signed   By: Awilda Metroourtnay  Bloomer M.D.   On: 01/30/2018 18:01   @IMAGES @  Assessment: 1.  Epigastric pain-differential diagnosis includes intermittent small bowel intussusception, peptic ulcer disease, gastric outlet obstruction secondary to adhesion versus mass versus ulceration.  2.  Nausea vomiting-secondary to possible diagnoses above as well.  3. 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  Recommendations: 1.  Acid suppression. 2.  Serial abdominal examinations. 3.  EGD in a.m.The patient understands the nature of the planned procedure, indications, risks, alternatives and potential complications including but not limited to bleeding, infection, perforation, damage to internal organs and possible oversedation/side effects from anesthesia. The patient agrees and gives consent to proceed.  Please  refer to procedure notes for findings, recommendations and patient disposition/instructions.  Thank you for the consult. Please call with questions or concerns.  Rosina Lowensteinoledo, Thaxton Pelley, "Mellody DanceKeith" MD [facility] [address] [place], [place] [address] [phone]  01/31/2018 8:16 PM

## 2018-01-31 NOTE — Consult Note (Signed)
[facility] GI Inpatient Consult Note   Jamey Reas, M.D.  Reason for Consult: Abdominal pain, nausea, vomiting   Attending Requesting Consult: Sterling Big, MD,General Surgery  Outpatient Primary Physician: Marikay Alar, M.D.  History of Present Illness: Anne James is a 47 y.o. female with a history of morbid obesity status post gastric bypass in 2002 followed by cholecystectomy 2018 presenting for 1 week history of progressive and intermittent epigastric pain with nausea and vomiting.  Patient was seen a total of about 3 times in the emergency room and admitted today after the latest visit due to intractable pain and vomiting.  Patient CT scan showed presumed small bowel intussusception in the jejunum along with possible internal hernia.  The patient admits to daily use of BC powders, however, and it was thought possible the patient is well could have a marginal ulcer and GI was consulted.  Past Medical History:  Past Medical History:  Diagnosis Date  . Anemia    low iron  . Anxiety   . 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 Chicken pox   . Depression   . Eating disorder   . GERD (gastroesophageal reflux disease)   . History of endometrial ablation 09/17/2016   In 2006. No periods since 2006.  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 Hypertension   . Phlebitis   . Primary localized osteoarthritis of left knee   . Primary localized osteoarthritis of right knee 01/06/2016  . 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 Intussusception of jejunum (HCC) 01/31/2018  . Ear popping, left 10/10/2017  . H/O bariatric surgery 12/14/2016  . Iron deficiency anemia secondary to inadequate dietary iron intake 12/14/2016  . Hot flashes 09/12/2016  . Status post right knee replacement 03/02/2016  . Primary  localized osteoarthritis of right knee 01/06/2016  . Neck pain 11/30/2015  . 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 Primary localized osteoarthritis of left knee   . Anxiety and depression 06/08/2015  . Chronic fatigue 07/02/2014  . Right hip pain 02/23/2012  . Osteoarthritis of both knees 10/17/2011  . Obesity (BMI 30-39.9) 10/17/2011  . History of pulmonary embolus (PE) 03/14/2000    Past Surgical History: Past Surgical History:  Procedure Laterality Date  . CHOLECYSTECTOMY N/A 09/18/2016   Procedure: LAPAROSCOPIC CHOLECYSTECTOMY;  Surgeon: Leafy Ro, MD;  Location: [facility] ORS;  Service: General;  Laterality: N/A;  . GASTRIC BYPASS  2002   [facility], Dr. Kennedy Bucker  . NOVASURE ABLATION    . TOTAL KNEE ARTHROPLASTY Left 06/29/2015   Procedure: LEFT TOTAL KNEE ARTHROPLASTY;  Surgeon: Salvatore Marvel, MD;  Location: [facility] OR;  Service: Orthopedics;  Laterality: Left;  . TOTAL KNEE ARTHROPLASTY Right 01/18/2016   Procedure: TOTAL KNEE ARTHROPLASTY;  Surgeon: Salvatore Marvel, MD;  Location: [facility] OR;  Service: Orthopedics;  Laterality: Right;  . TUBAL LIGATION      Allergies: No Known Allergies  Home Medications: Medications Prior to Admission  Medication Sig Dispense Refill Last Dose  . celecoxib (CELEBREX) 200 MG capsule Take 1 capsule (200 mg total) by mouth daily. 90 capsule 0 01/30/2018 at 0800  . clonazePAM (KLONOPIN) 0.5 MG tablet Take 1 tablet (0.5 mg total) by mouth 2 (two) times daily as needed for anxiety. 60 tablet 0 prn at prn  .  famotidine (PEPCID) 20 MG tablet Take 1 tablet (20 mg total) by mouth 2 (two) times daily. 60 tablet 0 01/30/2018 at 0800  . FLUoxetine (PROZAC) 40 MG capsule TAKE 1 CAPSULE BY MOUTH  DAILY 90 capsule 1 01/30/2018 at 0800  . Multiple Vitamin (MULTIVITAMIN) capsule Take 1 capsule by mouth daily.   01/30/2018 at 0800  . ondansetron (ZOFRAN ODT) 4 MG disintegrating tablet Take 1 tablet (4 mg total) by mouth every 8 (eight) hours as needed for nausea  or vomiting. 20 tablet 0 prn at prn  . sucralfate (CARAFATE) 1 g tablet Take 1 tablet (1 g total) by mouth 4 (four) times daily. 120 tablet 1 01/31/2018 at 0800  . ferrous sulfate 325 (65 FE) MG EC tablet Take 1 tablet (325 mg total) by mouth 2 (two) times daily with a meal. (Patient not taking: Reported on 01/31/2018) 60 tablet 3 Not Taking at Unknown time  . FLUoxetine (PROZAC) 20 MG tablet Take 1 tablet (20 mg total) by mouth daily. (Patient not taking: Reported on 01/31/2018) 30 tablet 3 Not Taking at 0800  . fluticasone (FLONASE) 50 MCG/ACT nasal spray Place 2 sprays into both nostrils daily. (Patient not taking: Reported on 01/31/2018) 16 g 6 Not Taking at Unknown time   Home medication reconciliation was completed with the patient.   Scheduled Inpatient Medications:   . enoxaparin (LOVENOX) injection  40 mg Subcutaneous Q24H  . ketorolac  30 mg Intravenous Q6H  . pantoprazole (PROTONIX) IV  40 mg Intravenous QHS    Continuous Inpatient Infusions:   . dextrose 5% lactated ringers Stopped (01/31/18 1729)    PRN Inpatient Medications:  ketorolac **FOLLOWED BY** [START ON 02/05/2018] ketorolac, metoprolol tartrate, morphine injection, ondansetron **OR** ondansetron (ZOFRAN) IV  Family History: family history includes Alcohol abuse in her mother; Anxiety disorder in her brother; Arthritis in her maternal grandmother; CAD in her mother; Diabetes in her maternal grandmother; Drug abuse in her mother; Heart disease in her maternal grandmother; Hypertension in her father and maternal grandmother; Kidney disease in her maternal grandmother; Stroke in her maternal grandmother.   GI Family History: Negative  Social History:   reports that she has never smoked. She has never used smokeless tobacco. She reports that she drank alcohol. She reports that she does not use drugs. The patient denies ETOH, tobacco, or drug use.    Review of Systems: Review of Systems - Negative except That history  of present illness  Physical Examination: BP (!) 166/92 (BP Location: Right Arm)   Pulse 69   Temp 99 F (37.2 C)   Resp 18   Ht 5\' 5"  (1.651 m)   Wt 90.7 kg   SpO2 100%   BMI 33.28 kg/m  Physical Exam  Constitutional: She is oriented to person, place, and time. She appears well-developed and well-nourished.  Non-toxic appearance. She appears ill. No distress.  HENT:  Head: Normocephalic and atraumatic.  Eyes: Pupils are equal, round, and reactive to light.  Cardiovascular: Normal rate and regular rhythm.  Pulmonary/Chest: Effort normal and breath sounds normal.  Abdominal: Normal appearance and bowel sounds are normal. There is tenderness in the periumbilical area. There is no rigidity, no rebound and no guarding. No hernia.  Neurological: She is alert and oriented to person, place, and time.  Skin: Skin is warm and dry. Capillary refill takes less than 2 seconds.    Data: Lab Results  Component Value Date   WBC 7.1 01/31/2018   HGB 14.0 01/31/2018  HCT 43.3 01/31/2018   MCV 87.1 01/31/2018   PLT 452 (H) 01/31/2018   Recent Labs  Lab 01/30/18 1433 01/31/18 0841  HGB 14.1 14.0   Lab Results  Component Value Date   NA 135 01/31/2018   K 3.8 01/31/2018   CL 96 (L) 01/31/2018   CO2 27 01/31/2018   BUN 9 01/31/2018   CREATININE 0.56 01/31/2018   Lab Results  Component Value Date   ALT 15 01/31/2018   AST 24 01/31/2018   ALKPHOS 64 01/31/2018   BILITOT 0.9 01/31/2018   No results for input(s): APTT, INR, PTT in the last 168 hours. CBC Latest Ref Rng & Units 01/31/2018 01/30/2018 01/21/2018  WBC 4.0 - 10.5 K/uL 7.1 7.7 9.9  Hemoglobin 12.0 - 15.0 g/dL 16.1 09.6 04.5  Hematocrit 36.0 - 46.0 % 43.3 45.0 42.3  Platelets 150 - 400 K/uL 452(H) 402(H) 322    STUDIES: Ct Abdomen Pelvis W Contrast  Addendum Date: 01/31/2018   ADDENDUM REPORT: 01/31/2018 12:25 ADDENDUM: No internal hernia is delineated on this current examination. Given history of previous  gastric bypass grafting, this patient is at increased risk for internal hernia development. If symptoms persist, repeat CT potentially could be helpful to further delineate in this regard. It is possible for bowel to intermittently become entrapped within an internal hernia in a circumstance of this nature. I discussed this case by phone on 01/31/2018 at 12:24 pm with Dr. Everlene Farrier, general surgery, who verbally acknowledged these results. Electronically Signed   By: Bretta Bang III M.D.   On: 01/31/2018 12:25   Result Date: 01/31/2018 CLINICAL DATA:  Generalized abdominal pain.  In the media. EXAM: CT ABDOMEN AND PELVIS WITH CONTRAST TECHNIQUE: Multidetector CT imaging of the abdomen and pelvis was performed using the standard protocol following bolus administration of intravenous contrast. Oral contrast was also administered. CONTRAST:  OMNIPAQUE IOHEXOL 300 MG/ML  SOLN COMPARISON:  September 22, 2016 FINDINGS: Lower chest: There is scarring in the posterior right lung base. There is no lung base edema or consolidation. Hepatobiliary: No focal liver lesions are evident. Fatty infiltration is noted near the fissure for the ligamentum teres. Gallbladder is absent. There is mild intrahepatic biliary duct dilatation. Common bile duct does not appear appreciably enlarged. No biliary duct mass or calculus is appreciable by CT. Pancreas: There is a 9 x 9 mm focus of decreased attenuation in the body of the pancreas, best seen on axial slice 13 series 7. No other evident pancreatic mass. No inflammatory focus evident. Pancreatic duct upper normal in size. Spleen: No splenic lesions are evident. Adrenals/Urinary Tract: Adrenals bilaterally appear unremarkable. There is a cyst arising from the medial mid right kidney measuring 1.8 x 1.6 cm. There is an apparent cyst in the medial mid left kidney measuring 1.1 x 1.0 cm. There is no appreciable hydronephrosis on either side. There is no evident renal or ureteral  calculus on either side. Urinary bladder is midline with wall thickness within normal limits. Stomach/Bowel: The patient is status post gastric bypass procedure. There is no perioperative wall thickening or abnormal fluid. Elsewhere, there is no appreciable bowel wall or mesenteric thickening. There is a focal area intussusception involving a loop of jejunum in the left mid abdomen without appreciable fluid or wall thickening in this area. No similar changes elsewhere. There is no appreciable bowel obstruction. There is no evident free air or portal venous air. Vascular/Lymphatic: No abdominal aortic aneurysm. Aorta is mildly tortuous. No focal vascular lesions  are evident. There is no demonstrable adenopathy in the abdomen or pelvis. Reproductive: Uterus is anteverted. The uterus is inhomogeneous in appearance, felt to represent underlying leiomyomatous change. There is a cystic area arising from the right ovary measuring 3.4 x 3.4 x 3.4 cm. No other extrauterine pelvic mass evident. Other: Appendix appears unremarkable. No abscess or ascites is evident in the abdomen or pelvis. Musculoskeletal: There is extensive degenerative change at L3-4, stable. No blastic or lytic bone lesions are evident. No intramuscular or abdominal wall lesions are appreciable. IMPRESSION: 1. Focal area of intussusception involving a loop of jejunum in the left lateral abdomen without bowel obstruction. No bowel wall thickening in this area. Significance of this finding is uncertain. 2. 9 mm area of decreased attenuation in the body of the pancreas consistent with a small mass. Nonemergent MR of the pancreas pre and post-contrast advised to further evaluate. 3. Status post gastric bypass procedure without complicating features appreciable on this study. 4. Gallbladder absent. Mild intrahepatic biliary duct dilatation. No biliary duct mass or calculus evident. 5. No evident abscess in the abdomen or pelvis. Appendix region appears normal.  6. Apparent leiomyomatous change within the uterus. Cystic area arising from right ovary measuring 3.4 x 3.4 x 3.4 cm. Suspect dominant follicle. Electronically Signed: By: Bretta BangWilliam  Woodruff III M.D. On: 01/31/2018 10:19   Dg Abdomen Acute W/chest  Result Date: 01/30/2018 CLINICAL DATA:  Recurrent abdominal pain, nausea and vomiting. Recent diagnosis of gastritis and gastric bypass. EXAM: DG ABDOMEN ACUTE W/ 1V CHEST COMPARISON:  Chest radiograph September 23, 2016 FINDINGS: Cardiomediastinal silhouette is normal. Lungs are clear, no pleural effusions. No pneumothorax. Soft tissue planes and included osseous structures are unremarkable. Bowel gas pattern is nondilated and nonobstructive. Surgical clips in the included right abdomen compatible with cholecystectomy. Surgical stable LEFT abdomen compatible with history of gastric bypass. But no intra-abdominal mass effect, pathologic calcifications or free air. Soft tissue planes and included osseous structures are non-suspicious. Lumbar levoscoliosis. IMPRESSION: Normal bowel gas pattern.  No acute cardiopulmonary disease. Electronically Signed   By: Awilda Metroourtnay  Bloomer M.D.   On: 01/30/2018 18:01   @IMAGES @  Assessment: 1.  Epigastric pain-differential diagnosis includes intermittent small bowel intussusception, peptic ulcer disease, gastric outlet obstruction secondary to adhesion versus mass versus ulceration.  2.  Nausea vomiting-secondary to possible diagnoses above as well.  3. 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  Recommendations: 1.  Acid suppression. 2.  Serial abdominal examinations. 3.  EGD in a.m.The patient understands the nature of the planned procedure, indications, risks, alternatives and potential complications including but not limited to bleeding, infection, perforation, damage to internal organs and possible oversedation/side effects from anesthesia. The patient agrees and gives consent to proceed.  Please  refer to procedure notes for findings, recommendations and patient disposition/instructions.  Thank you for the consult. Please call with questions or concerns.  Rosina Lowensteinoledo, Marzelle Rutten, "Mellody DanceKeith" MD [facility] [address] [place], [place] [address] [phone]  01/31/2018 8:16 PM

## 2018-02-01 ENCOUNTER — Encounter
Admission: EM | Disposition: A | Discharge status: Home / Self Care | Payer: Self-pay | Source: Home / Self Care | Attending: Surgery

## 2018-02-01 ENCOUNTER — Encounter: Payer: Self-pay | Admitting: Anesthesiology

## 2018-02-01 LAB — BASIC METABOLIC PANEL
Anion gap: 8 (ref 5–15)
BUN: 6 mg/dL (ref 6–20)
CO2: 29 mmol/L (ref 22–32)
Calcium: 8.8 mg/dL — ABNORMAL LOW (ref 8.9–10.3)
Chloride: 102 mmol/L (ref 98–111)
Creatinine, Ser: 0.5 mg/dL (ref 0.44–1.00)
GFR calc Af Amer: >60 mL/min (ref >60)
GLUCOSE: 96 mg/dL (ref 70–99)
POTASSIUM: 2.7 mmol/L — AB (ref 3.5–5.1)
Sodium: 139 mmol/L (ref 135–145)

## 2018-02-01 LAB — CBC
HCT: 42.2 % (ref 36.0–46.0)
Hemoglobin: 13.6 g/dL (ref 12.0–15.0)
MCH: 28.2 pg (ref 26.0–34.0)
MCHC: 32.2 g/dL (ref 30.0–36.0)
MCV: 87.4 fL (ref 80.0–100.0)
Platelets: 408 10*3/uL — ABNORMAL HIGH (ref 150–400)
RBC: 4.83 MIL/uL (ref 3.87–5.11)
RDW: 14.4 % (ref 11.5–15.5)
WBC: 6.6 10*3/uL (ref 4.0–10.5)
nRBC: 0 % (ref 0.0–0.2)

## 2018-02-01 SURGERY — ESOPHAGOGASTRODUODENOSCOPY (EGD) WITH PROPOFOL
Anesthesia: Monitor Anesthesia Care

## 2018-02-01 MED ORDER — POTASSIUM CHLORIDE 10 MEQ/100ML IV SOLN
10.0000 meq | Freq: Once | INTRAVENOUS | Status: AC
  Administered 2018-02-01: 10 meq via INTRAVENOUS

## 2018-02-01 MED ORDER — ZOLPIDEM TARTRATE 5 MG PO TABS
5.0000 mg | ORAL_TABLET | Freq: Once | ORAL | Status: AC
  Administered 2018-02-01: 5 mg via ORAL
  Filled 2018-02-01: qty 1

## 2018-02-01 MED ORDER — POTASSIUM CHLORIDE CRYS ER 20 MEQ PO TBCR
40.0000 meq | EXTENDED_RELEASE_TABLET | Freq: Two times a day (BID) | ORAL | Status: DC
  Administered 2018-02-01 – 2018-02-04 (×6): 40 meq via ORAL
  Filled 2018-02-01 (×7): qty 2

## 2018-02-01 MED ORDER — SODIUM CHLORIDE 0.9 % IV SOLN
INTRAVENOUS | Status: DC | PRN
  Administered 2018-02-01: 250 mL via INTRAVENOUS
  Administered 2018-02-02: 13:00:00 via INTRAVENOUS
  Administered 2018-02-03 – 2018-02-05 (×4): 250 mL via INTRAVENOUS

## 2018-02-01 MED ORDER — POTASSIUM CHLORIDE 10 MEQ/100ML IV SOLN
10.0000 meq | INTRAVENOUS | Status: AC
  Administered 2018-02-01 (×3): 10 meq via INTRAVENOUS
  Filled 2018-02-01 (×4): qty 100

## 2018-02-01 MED ORDER — PANTOPRAZOLE SODIUM 40 MG IV SOLR
40.0000 mg | Freq: Two times a day (BID) | INTRAVENOUS | Status: DC
  Administered 2018-02-01 – 2018-02-06 (×10): 40 mg via INTRAVENOUS
  Filled 2018-02-01 (×11): qty 40

## 2018-02-01 NOTE — Progress Notes (Signed)
[facility] Progress Note   Subjective: No acute events overnight. She reports significant improvement in her abdominal pain and she now rates it at a 2. She also endorses intermittent nausea but reports the anti-emetics are effective at controlling this. No complaints of fever, chills, cough, or emesis. She has remained NPO. Mobilizing well. Plan for EGD this morning.   Objective: Vital signs in last 24 hours: Temp:  [97.9 F (36.6 C)-99 F (37.2 C)] 98.2 F (36.8 C) (11/21 0428) Pulse Rate:  [62-79] 62 (11/21 0428) Resp:  [18] 18 (11/21 0428) BP: (129-166)/(76-97) 129/76 (11/21 0428) SpO2:  [97 %-100 %] 99 % (11/21 0428) Last BM Date: 02/01/18  Intake/Output from previous day: 11/20 0701 - 11/21 0700 In: 2228.6 [I.V.:1228.6; IV Piggyback:1000] Out: 750 [Urine:750] Intake/Output this shift: No intake/output data recorded.  PE: Gen:  Alert, NAD, pleasant Pulm:  Normal effort Abd: Soft, non-tender, non-distended Skin: warm and dry, no rashes  Psych: A&Ox3   Lab Results:  Recent Labs    01/31/18 0841 02/01/18 0552  WBC 7.1 6.6  HGB 14.0 13.6  HCT 43.3 42.2  PLT 452* 408*   BMET Recent Labs    01/31/18 0841 02/01/18 0545  NA 135 139  K 3.8 2.7*  CL 96* 102  CO2 27 29  GLUCOSE 107* 96  BUN 9 6  CREATININE 0.56 0.50  CALCIUM 9.0 8.8*   PT/INR No results for input(s): LABPROT, INR in the last 72 hours. CMP     Component Value Date/Time   NA 139 02/01/2018 0545   NA 139 10/10/2017 1426   NA 139 08/23/2011 1744   K 2.7 (LL) 02/01/2018 0545   K 4.0 08/23/2011 1744   CL 102 02/01/2018 0545   CL 105 08/23/2011 1744   CO2 29 02/01/2018 0545   CO2 26 08/23/2011 1744   GLUCOSE 96 02/01/2018 0545   GLUCOSE 84 08/23/2011 1744   BUN 6 02/01/2018 0545   BUN 14 10/10/2017 1426   BUN 15 08/23/2011 1744   CREATININE 0.50 02/01/2018 0545   CREATININE 0.84 08/23/2011 1744   CALCIUM 8.8 (L) 02/01/2018 0545   CALCIUM 8.8 08/23/2011 1744   PROT  7.4 01/31/2018 0841   PROT 6.6 10/10/2017 1426   PROT 7.0 08/23/2011 1744   ALBUMIN 3.9 01/31/2018 0841   ALBUMIN 4.1 10/10/2017 1426   ALBUMIN 3.6 08/23/2011 1744   AST 24 01/31/2018 0841   AST 23 08/23/2011 1744   ALT 15 01/31/2018 0841   ALT 17 08/23/2011 1744   ALKPHOS 64 01/31/2018 0841   ALKPHOS 74 08/23/2011 1744   BILITOT 0.9 01/31/2018 0841   BILITOT <0.2 10/10/2017 1426   BILITOT 0.5 08/23/2011 1744   GFRNONAA >60 02/01/2018 0545   GFRNONAA >60 08/23/2011 1744   GFRAA >60 02/01/2018 0545   GFRAA >60 08/23/2011 1744   Lipase     Component Value Date/Time   LIPASE 28 01/31/2018 0841       Studies/Results: Ct Abdomen Pelvis W Contrast  Addendum Date: 01/31/2018   ADDENDUM REPORT: 01/31/2018 12:25 ADDENDUM: No internal hernia is delineated on this current examination. Given history of previous gastric bypass grafting, this patient is at increased risk for internal hernia development. If symptoms persist, repeat CT potentially could be helpful to further delineate in this regard. It is possible for bowel to intermittently become entrapped within an internal hernia in a circumstance of this nature. I discussed this case by phone on 01/31/2018 at 12:24 pm with Dr. Everlene Farrier, general  surgery, who verbally acknowledged these results. Electronically Signed   By: Bretta BangWilliam  Woodruff III M.D.   On: 01/31/2018 12:25   Result Date: 01/31/2018 CLINICAL DATA:  Generalized abdominal pain.  In the media. EXAM: CT ABDOMEN AND PELVIS WITH CONTRAST TECHNIQUE: Multidetector CT imaging of the abdomen and pelvis was performed using the standard protocol following bolus administration of intravenous contrast. Oral contrast was also administered. CONTRAST:  100mL OMNIPAQUE IOHEXOL 300 MG/ML  SOLN COMPARISON:  September 22, 2016 FINDINGS: Lower chest: There is scarring in the posterior right lung base. There is no lung base edema or consolidation. Hepatobiliary: No focal liver lesions are evident. Fatty  infiltration is noted near the fissure for the ligamentum teres. Gallbladder is absent. There is mild intrahepatic biliary duct dilatation. Common bile duct does not appear appreciably enlarged. No biliary duct mass or calculus is appreciable by CT. Pancreas: There is a 9 x 9 mm focus of decreased attenuation in the body of the pancreas, best seen on axial slice 13 series 7. No other evident pancreatic mass. No inflammatory focus evident. Pancreatic duct upper normal in size. Spleen: No splenic lesions are evident. Adrenals/Urinary Tract: Adrenals bilaterally appear unremarkable. There is a cyst arising from the medial mid right kidney measuring 1.8 x 1.6 cm. There is an apparent cyst in the medial mid left kidney measuring 1.1 x 1.0 cm. There is no appreciable hydronephrosis on either side. There is no evident renal or ureteral calculus on either side. Urinary bladder is midline with wall thickness within normal limits. Stomach/Bowel: The patient is status post gastric bypass procedure. There is no perioperative wall thickening or abnormal fluid. Elsewhere, there is no appreciable bowel wall or mesenteric thickening. There is a focal area intussusception involving a loop of jejunum in the left mid abdomen without appreciable fluid or wall thickening in this area. No similar changes elsewhere. There is no appreciable bowel obstruction. There is no evident free air or portal venous air. Vascular/Lymphatic: No abdominal aortic aneurysm. Aorta is mildly tortuous. No focal vascular lesions are evident. There is no demonstrable adenopathy in the abdomen or pelvis. Reproductive: Uterus is anteverted. The uterus is inhomogeneous in appearance, felt to represent underlying leiomyomatous change. There is a cystic area arising from the right ovary measuring 3.4 x 3.4 x 3.4 cm. No other extrauterine pelvic mass evident. Other: Appendix appears unremarkable. No abscess or ascites is evident in the abdomen or pelvis.  Musculoskeletal: There is extensive degenerative change at L3-4, stable. No blastic or lytic bone lesions are evident. No intramuscular or abdominal wall lesions are appreciable. IMPRESSION: 1. Focal area of intussusception involving a loop of jejunum in the left lateral abdomen without bowel obstruction. No bowel wall thickening in this area. Significance of this finding is uncertain. 2. 9 mm area of decreased attenuation in the body of the pancreas consistent with a small mass. Nonemergent MR of the pancreas pre and post-contrast advised to further evaluate. 3. Status post gastric bypass procedure without complicating features appreciable on this study. 4. Gallbladder absent. Mild intrahepatic biliary duct dilatation. No biliary duct mass or calculus evident. 5. No evident abscess in the abdomen or pelvis. Appendix region appears normal. 6. Apparent leiomyomatous change within the uterus. Cystic area arising from right ovary measuring 3.4 x 3.4 x 3.4 cm. Suspect dominant follicle. Electronically Signed: By: Bretta BangWilliam  Woodruff III M.D. On: 01/31/2018 10:19   Dg Abdomen Acute W/chest  Result Date: 01/30/2018 CLINICAL DATA:  Recurrent abdominal pain, nausea and vomiting. Recent diagnosis  of gastritis and gastric bypass. EXAM: DG ABDOMEN ACUTE W/ 1V CHEST COMPARISON:  Chest radiograph September 23, 2016 FINDINGS: Cardiomediastinal silhouette is normal. Lungs are clear, no pleural effusions. No pneumothorax. Soft tissue planes and included osseous structures are unremarkable. Bowel gas pattern is nondilated and nonobstructive. Surgical clips in the included right abdomen compatible with cholecystectomy. Surgical stable LEFT abdomen compatible with history of gastric bypass. But no intra-abdominal mass effect, pathologic calcifications or free air. Soft tissue planes and included osseous structures are non-suspicious. Lumbar levoscoliosis. IMPRESSION: Normal bowel gas pattern.  No acute cardiopulmonary disease.  Electronically Signed   By: Awilda Metro M.D.   On: 01/30/2018 18:01    Anti-infectives: Anti-infectives (From admission, onward)   None       Assessment/Plan  Generalized Abdominal Pain (Intussusecption vs Internal hernia vs Marginal Ulceration) Anne James is a 47 y.o. female with improving generalized abdominal and nausea which is concerning for intussusception vs internal herniation vs marginal ulceration based on presentation, imaging findings, and surgical history which is complicated by pertinent comorbidities including iron deficiency anemia, generalized anxiety and depression, HTN, history of pulmonary embolism following gastric bypass in 2002, and obesity.               - NPO, IVF             - Pain Control PRN, Anti-Emetics PRN             - Monitor abdominal examination and ongoing bowel function             - GI consult (Dr Norma Fredrickson) appreciated, plan for EGD later today  - Will reassess following EGD this afternoon regarding possible diagnostic laparoscopy  - Hypokalemia, replete, continue to monitor             - Mobilize             - DVT Prophylaxis  -- Lynden Oxford , PA-C [facility] 02/01/2018, 10:18 AM [phone] M-F: 7am - 4pm

## 2018-02-01 NOTE — Progress Notes (Signed)
[facility] Progress [facility]  Subjective: Patient seen for follow-up of a presumed small bowel intussusception, possible marginal ulcer.  NSAID abuse history.  Patient feels better today with less nausea.  Objective: Vital signs in last 24 hours: Temp:  [97.9 F (36.6 C)-98.5 F (36.9 C)] 98.1 F (36.7 C) (11/21 1358) Pulse Rate:  [62-69] 64 (11/21 1358) Resp:  [18-20] 20 (11/21 1358) BP: (120-164)/(62-90) 120/62 (11/21 1358) SpO2:  [97 %-100 %] 99 % (11/21 1358) Blood pressure 120/62, pulse 64, temperature 98.1 F (36.7 C), resp. rate 20, height 5\' 5"  (1.651 m), weight 90.7 kg, SpO2 99 %.    Intake/Output from previous day: 11/20 0701 - 11/21 0700 In: 2228.6 [I.V.:1228.6; IV Piggyback:1000] Out: 750 [Urine:750]  Intake/Output this shift: Total I/O In: 1150.8 [I.V.:887.2; IV Piggyback:263.6] Out: -    General appearance: Alert, no distress Resp: Clear to auscultation bilaterally Cardio: Regular rate no gallop GI: Soft, diffusely tender mildly without rebound guarding or rigidity.  Bowel sounds are positive. Extremities: Edema   Lab Results: Results for orders placed or performed during the hospital encounter of 01/31/18 (from the past 24 hour(s))  Basic metabolic panel     Status: Abnormal   Collection Time: 02/01/18  5:45 AM  Result Value Ref Range   Sodium 139 135 - 145 mmol/L   Potassium 2.7 (LL) 3.5 - 5.1 mmol/L   Chloride 102 98 - 111 mmol/L   CO2 29 22 - 32 mmol/L   Glucose, Bld 96 70 - 99 mg/dL   BUN 6 6 - 20 mg/dL   Creatinine, Ser 9.520.50 0.44 - 1.00 mg/dL   Calcium 8.8 (L) 8.9 - 10.3 mg/dL   GFR calc non Af Amer >60 >60 mL/min   GFR calc Af Amer >60 >60 mL/min   Anion gap 8 5 - 15  CBC     Status: Abnormal   Collection Time: 02/01/18  5:52 AM  Result Value Ref Range   WBC 6.6 4.0 - 10.5 K/uL   RBC 4.83 3.87 - 5.11 MIL/uL   Hemoglobin 13.6 12.0 - 15.0 g/dL   HCT 84.142.2 32.436.0 - 40.146.0 %   MCV 87.4 80.0 - 100.0 fL   MCH 28.2 26.0  - 34.0 pg   MCHC 32.2 30.0 - 36.0 g/dL   RDW 02.714.4 25.311.5 - 66.415.5 %   Platelets 408 (H) 150 - 400 K/uL   nRBC 0.0 0.0 - 0.2 %     Recent Labs    01/30/18 1433 01/31/18 0841 02/01/18 0552  WBC 7.7 7.1 6.6  HGB 14.1 14.0 13.6  HCT 45.0 43.3 42.2  PLT 402* 452* 408*   BMET Recent Labs    01/30/18 1433 01/31/18 0841 02/01/18 0545  NA 135 135 139  K 3.9 3.8 2.7*  CL 97* 96* 102  CO2 23 27 29   GLUCOSE 104* 107* 96  BUN 7 9 6   CREATININE 0.51 0.56 0.50  CALCIUM 9.2 9.0 8.8*   LFT Recent Labs    01/31/18 0841  PROT 7.4  ALBUMIN 3.9  AST 24  ALT 15  ALKPHOS 64  BILITOT 0.9   PT/INR No results for input(s): LABPROT, INR in the last 72 hours. Hepatitis Panel No results for input(s): HEPBSAG, HCVAB, HEPAIGM, HEPBIGM in the last 72 hours. C-Diff No results for input(s): CDIFFTOX in the last 72 hours. No results for input(s): CDIFFPCR in the last 72 hours.   Studies/Results: Ct Abdomen Pelvis W Contrast  Addendum Date: 01/31/2018   ADDENDUM REPORT:  01/31/2018 12:25 ADDENDUM: No internal hernia is delineated on this current examination. Given history of previous gastric bypass grafting, this patient is at increased risk for internal hernia development. If symptoms persist, repeat CT potentially could be helpful to further delineate in this regard. It is possible for bowel to intermittently become entrapped within an internal hernia in a circumstance of this nature. I discussed this case by phone on 01/31/2018 at 12:24 pm with Dr. Everlene Farrier, general surgery, who verbally acknowledged these results. Electronically Signed   By: Bretta Bang III M.D.   On: 01/31/2018 12:25   Result Date: 01/31/2018 CLINICAL DATA:  Generalized abdominal pain.  In the media. EXAM: CT ABDOMEN AND PELVIS WITH CONTRAST TECHNIQUE: Multidetector CT imaging of the abdomen and pelvis was performed using the standard protocol following bolus administration of intravenous contrast. Oral contrast was also  administered. CONTRAST:  OMNIPAQUE IOHEXOL 300 MG/ML  SOLN COMPARISON:  September 22, 2016 FINDINGS: Lower chest: There is scarring in the posterior right lung base. There is no lung base edema or consolidation. Hepatobiliary: No focal liver lesions are evident. Fatty infiltration is noted near the fissure for the ligamentum teres. Gallbladder is absent. There is mild intrahepatic biliary duct dilatation. Common bile duct does not appear appreciably enlarged. No biliary duct mass or calculus is appreciable by CT. Pancreas: There is a 9 x 9 mm focus of decreased attenuation in the body of the pancreas, best seen on axial slice 13 series 7. No other evident pancreatic mass. No inflammatory focus evident. Pancreatic duct upper normal in size. Spleen: No splenic lesions are evident. Adrenals/Urinary Tract: Adrenals bilaterally appear unremarkable. There is a cyst arising from the medial mid right kidney measuring 1.8 x 1.6 cm. There is an apparent cyst in the medial mid left kidney measuring 1.1 x 1.0 cm. There is no appreciable hydronephrosis on either side. There is no evident renal or ureteral calculus on either side. Urinary bladder is midline with wall thickness within normal limits. Stomach/Bowel: The patient is status post gastric bypass procedure. There is no perioperative wall thickening or abnormal fluid. Elsewhere, there is no appreciable bowel wall or mesenteric thickening. There is a focal area intussusception involving a loop of jejunum in the left mid abdomen without appreciable fluid or wall thickening in this area. No similar changes elsewhere. There is no appreciable bowel obstruction. There is no evident free air or portal venous air. Vascular/Lymphatic: No abdominal aortic aneurysm. Aorta is mildly tortuous. No focal vascular lesions are evident. There is no demonstrable adenopathy in the abdomen or pelvis. Reproductive: Uterus is anteverted. The uterus is inhomogeneous in appearance, felt to  represent underlying leiomyomatous change. There is a cystic area arising from the right ovary measuring 3.4 x 3.4 x 3.4 cm. No other extrauterine pelvic mass evident. Other: Appendix appears unremarkable. No abscess or ascites is evident in the abdomen or pelvis. Musculoskeletal: There is extensive degenerative change at L3-4, stable. No blastic or lytic bone lesions are evident. No intramuscular or abdominal wall lesions are appreciable. IMPRESSION: 1. Focal area of intussusception involving a loop of jejunum in the left lateral abdomen without bowel obstruction. No bowel wall thickening in this area. Significance of this finding is uncertain. 2. 9 mm area of decreased attenuation in the body of the pancreas consistent with a small mass. Nonemergent MR of the pancreas pre and post-contrast advised to further evaluate. 3. Status post gastric bypass procedure without complicating features appreciable on this study. 4. Gallbladder absent. Mild intrahepatic  biliary duct dilatation. No biliary duct mass or calculus evident. 5. No evident abscess in the abdomen or pelvis. Appendix region appears normal. 6. Apparent leiomyomatous change within the uterus. Cystic area arising from right ovary measuring 3.4 x 3.4 x 3.4 cm. Suspect dominant follicle. Electronically Signed: By: Bretta Bang III M.D. On: 01/31/2018 10:19    Scheduled Inpatient Medications:   . enoxaparin (LOVENOX) injection  40 mg Subcutaneous Q24H  . pantoprazole (PROTONIX) IV  40 mg Intravenous Q12H  . potassium chloride  40 mEq Oral BID    Continuous Inpatient Infusions:   . sodium chloride Stopped (02/01/18 1515)  . dextrose 5% lactated ringers 100 mL/hr at 02/01/18 1615    PRN Inpatient Medications:  sodium chloride, metoprolol tartrate, morphine injection, ondansetron **OR** ondansetron (ZOFRAN) IV   Assessment:  1.  Status post gastric bypass several years ago. 2.  Nausea vomiting, resolving.  Possible self resolution of  intussusception versus improvement with acid suppression. 3.  Epigastric pain, improved. 4.  History of NSAID abuse.  Plan:  1.  Proceed with EGD.The patient understands the nature of the planned procedure, indications, risks, alternatives and potential complications including but not limited to bleeding, infection, perforation, damage to internal organs and possible oversedation/side effects from anesthesia. The patient agrees and gives consent to proceed.  Please refer to procedure notes for findings, recommendations and patient disposition/instructions.  Teodoro K. Norma Fredrickson, M.D. 02/01/2018, 6:53 PM

## 2018-02-02 ENCOUNTER — Inpatient Hospital Stay: Payer: Managed Care, Other (non HMO) | Admitting: Anesthesiology

## 2018-02-02 ENCOUNTER — Inpatient Hospital Stay: Payer: Managed Care, Other (non HMO)

## 2018-02-02 ENCOUNTER — Encounter
Admission: EM | Disposition: A | Discharge status: Home / Self Care | Payer: Self-pay | Source: Home / Self Care | Attending: Surgery

## 2018-02-02 ENCOUNTER — Encounter: Payer: Self-pay | Admitting: Anesthesiology

## 2018-02-02 DIAGNOSIS — R1110 Vomiting, unspecified: Secondary | ICD-10-CM

## 2018-02-02 DIAGNOSIS — R112 Nausea with vomiting, unspecified: Secondary | ICD-10-CM

## 2018-02-02 HISTORY — PX: LAPAROSCOPY: SHX197

## 2018-02-02 HISTORY — PX: ESOPHAGOGASTRODUODENOSCOPY (EGD) WITH PROPOFOL: SHX5813

## 2018-02-02 HISTORY — PX: EPIGASTRIC HERNIA REPAIR: SHX404

## 2018-02-02 LAB — BASIC METABOLIC PANEL
ANION GAP: 6 (ref 5–15)
BUN: 9 mg/dL (ref 6–20)
CHLORIDE: 108 mmol/L (ref 98–111)
CO2: 28 mmol/L (ref 22–32)
Calcium: 8.5 mg/dL — ABNORMAL LOW (ref 8.9–10.3)
Creatinine, Ser: 0.55 mg/dL (ref 0.44–1.00)
GFR calc non Af Amer: >60 mL/min (ref >60)
Glucose, Bld: 84 mg/dL (ref 70–99)
POTASSIUM: 3.6 mmol/L (ref 3.5–5.1)
Sodium: 142 mmol/L (ref 135–145)

## 2018-02-02 LAB — CBC
HEMATOCRIT: 41.3 % (ref 36.0–46.0)
HEMOGLOBIN: 12.7 g/dL (ref 12.0–15.0)
MCH: 27.6 pg (ref 26.0–34.0)
MCHC: 30.8 g/dL (ref 30.0–36.0)
MCV: 89.8 fL (ref 80.0–100.0)
NRBC: 0 % (ref 0.0–0.2)
Platelets: 370 10*3/uL (ref 150–400)
RBC: 4.6 MIL/uL (ref 3.87–5.11)
RDW: 14.5 % (ref 11.5–15.5)
WBC: 5.9 10*3/uL (ref 4.0–10.5)

## 2018-02-02 LAB — PREGNANCY, URINE: Preg Test, Ur: NEGATIVE

## 2018-02-02 LAB — HIV ANTIBODY (ROUTINE TESTING W REFLEX): HIV Screen 4th Generation wRfx: NONREACTIVE

## 2018-02-02 SURGERY — ESOPHAGOGASTRODUODENOSCOPY (EGD) WITH PROPOFOL
Anesthesia: General

## 2018-02-02 SURGERY — LAPAROSCOPY, DIAGNOSTIC
Anesthesia: General

## 2018-02-02 MED ORDER — SODIUM CHLORIDE FLUSH 0.9 % IV SOLN
INTRAVENOUS | Status: AC
  Filled 2018-02-02: qty 50

## 2018-02-02 MED ORDER — ONDANSETRON HCL 4 MG/2ML IJ SOLN
4.0000 mg | Freq: Four times a day (QID) | INTRAMUSCULAR | Status: DC
  Administered 2018-02-03 – 2018-02-06 (×13): 4 mg via INTRAVENOUS
  Filled 2018-02-02 (×13): qty 2

## 2018-02-02 MED ORDER — FENTANYL CITRATE (PF) 100 MCG/2ML IJ SOLN
INTRAMUSCULAR | Status: AC
  Filled 2018-02-02: qty 2

## 2018-02-02 MED ORDER — FENTANYL CITRATE (PF) 100 MCG/2ML IJ SOLN
25.0000 ug | INTRAMUSCULAR | Status: DC | PRN
  Administered 2018-02-02 (×4): 50 ug via INTRAVENOUS

## 2018-02-02 MED ORDER — FENTANYL CITRATE (PF) 100 MCG/2ML IJ SOLN
INTRAMUSCULAR | Status: DC | PRN
  Administered 2018-02-02 (×2): 50 ug via INTRAVENOUS
  Administered 2018-02-02: 100 ug via INTRAVENOUS

## 2018-02-02 MED ORDER — ROCURONIUM BROMIDE 50 MG/5ML IV SOLN
INTRAVENOUS | Status: AC
  Filled 2018-02-02: qty 1

## 2018-02-02 MED ORDER — LIDOCAINE HCL (CARDIAC) PF 100 MG/5ML IV SOSY
PREFILLED_SYRINGE | INTRAVENOUS | Status: DC | PRN
  Administered 2018-02-02: 100 mg via INTRAVENOUS

## 2018-02-02 MED ORDER — DEXMEDETOMIDINE HCL 200 MCG/2ML IV SOLN
INTRAVENOUS | Status: DC | PRN
  Administered 2018-02-02: 12 ug via INTRAVENOUS

## 2018-02-02 MED ORDER — PIPERACILLIN-TAZOBACTAM 3.375 G IVPB 30 MIN: 3.3750 g | Freq: Three times a day (TID) | INTRAVENOUS | Status: DC

## 2018-02-02 MED ORDER — MIDAZOLAM HCL 2 MG/2ML IJ SOLN
INTRAMUSCULAR | Status: AC
  Filled 2018-02-02: qty 2

## 2018-02-02 MED ORDER — SODIUM CHLORIDE 0.9 % IV SOLN
INTRAVENOUS | Status: DC | PRN
  Administered 2018-02-02: 70 mL

## 2018-02-02 MED ORDER — CEFAZOLIN SODIUM 1 G IJ SOLR
INTRAMUSCULAR | Status: AC
  Filled 2018-02-02: qty 20

## 2018-02-02 MED ORDER — FENTANYL CITRATE (PF) 100 MCG/2ML IJ SOLN
INTRAMUSCULAR | Status: AC
  Administered 2018-02-02: 50 ug via INTRAVENOUS
  Filled 2018-02-02: qty 2

## 2018-02-02 MED ORDER — METOPROLOL TARTRATE 5 MG/5ML IV SOLN
INTRAVENOUS | Status: AC
  Filled 2018-02-02: qty 5

## 2018-02-02 MED ORDER — OXYCODONE HCL 5 MG PO TABS
10.0000 mg | ORAL_TABLET | ORAL | Status: DC | PRN
  Administered 2018-02-02: 10 mg via ORAL
  Filled 2018-02-02: qty 2

## 2018-02-02 MED ORDER — LACTATED RINGERS IV SOLN
INTRAVENOUS | Status: DC
  Administered 2018-02-02: 17:00:00 via INTRAVENOUS

## 2018-02-02 MED ORDER — ONDANSETRON HCL 4 MG/2ML IJ SOLN
INTRAMUSCULAR | Status: DC | PRN
  Administered 2018-02-02: 4 mg via INTRAVENOUS

## 2018-02-02 MED ORDER — PIPERACILLIN-TAZOBACTAM 3.375 G IVPB
3.3750 g | Freq: Three times a day (TID) | INTRAVENOUS | Status: DC
  Administered 2018-02-02 – 2018-02-06 (×11): 3.375 g via INTRAVENOUS
  Filled 2018-02-02 (×12): qty 50

## 2018-02-02 MED ORDER — ACETAMINOPHEN 10 MG/ML IV SOLN
INTRAVENOUS | Status: DC | PRN
  Administered 2018-02-02: 1000 mg via INTRAVENOUS

## 2018-02-02 MED ORDER — PROPOFOL 10 MG/ML IV BOLUS
INTRAVENOUS | Status: DC | PRN
  Administered 2018-02-02: 50 mg via INTRAVENOUS

## 2018-02-02 MED ORDER — FENTANYL CITRATE (PF) 100 MCG/2ML IJ SOLN
INTRAMUSCULAR | Status: DC | PRN
  Administered 2018-02-02: 100 ug via INTRAVENOUS

## 2018-02-02 MED ORDER — OXYCODONE HCL 5 MG PO TABS: 5.0000 mg | ORAL_TABLET | Freq: Once | ORAL | Status: DC | PRN

## 2018-02-02 MED ORDER — PROPOFOL 10 MG/ML IV BOLUS
INTRAVENOUS | Status: AC
  Filled 2018-02-02: qty 20

## 2018-02-02 MED ORDER — OXYCODONE HCL 5 MG/5ML PO SOLN: 5.0000 mg | Freq: Once | ORAL | Status: DC | PRN

## 2018-02-02 MED ORDER — LIDOCAINE HCL (CARDIAC) PF 100 MG/5ML IV SOSY
PREFILLED_SYRINGE | INTRAVENOUS | Status: DC | PRN
  Administered 2018-02-02: 60 mg via INTRAVENOUS

## 2018-02-02 MED ORDER — DEXMEDETOMIDINE HCL IN NACL 200 MCG/50ML IV SOLN
INTRAVENOUS | Status: AC
  Filled 2018-02-02: qty 50

## 2018-02-02 MED ORDER — PROCHLORPERAZINE EDISYLATE 10 MG/2ML IJ SOLN
10.0000 mg | Freq: Four times a day (QID) | INTRAMUSCULAR | Status: DC | PRN
  Filled 2018-02-02: qty 2

## 2018-02-02 MED ORDER — METRONIDAZOLE IN NACL 5-0.79 MG/ML-% IV SOLN
INTRAVENOUS | Status: DC | PRN
  Administered 2018-02-02: 500 mg via INTRAVENOUS

## 2018-02-02 MED ORDER — DEXAMETHASONE SODIUM PHOSPHATE 10 MG/ML IJ SOLN
INTRAMUSCULAR | Status: DC | PRN
  Administered 2018-02-02: 10 mg via INTRAVENOUS

## 2018-02-02 MED ORDER — SUCCINYLCHOLINE CHLORIDE 20 MG/ML IJ SOLN
INTRAMUSCULAR | Status: DC | PRN
  Administered 2018-02-02: 100 mg via INTRAVENOUS

## 2018-02-02 MED ORDER — SUGAMMADEX SODIUM 200 MG/2ML IV SOLN
INTRAVENOUS | Status: AC
  Filled 2018-02-02: qty 2

## 2018-02-02 MED ORDER — ROCURONIUM BROMIDE 100 MG/10ML IV SOLN
INTRAVENOUS | Status: DC | PRN
  Administered 2018-02-02: 45 mg via INTRAVENOUS
  Administered 2018-02-02: 5 mg via INTRAVENOUS

## 2018-02-02 MED ORDER — CEFAZOLIN SODIUM-DEXTROSE 2-3 GM-%(50ML) IV SOLR
INTRAVENOUS | Status: DC | PRN
  Administered 2018-02-02: 2 g via INTRAVENOUS

## 2018-02-02 MED ORDER — BUPIVACAINE-EPINEPHRINE (PF) 0.25% -1:200000 IJ SOLN
INTRAMUSCULAR | Status: DC | PRN
  Administered 2018-02-02: 30 mL

## 2018-02-02 MED ORDER — PROPOFOL 10 MG/ML IV BOLUS
INTRAVENOUS | Status: DC | PRN
  Administered 2018-02-02: 150 mg via INTRAVENOUS

## 2018-02-02 MED ORDER — ACETAMINOPHEN 10 MG/ML IV SOLN
INTRAVENOUS | Status: AC
  Filled 2018-02-02: qty 100

## 2018-02-02 MED ORDER — METOPROLOL TARTRATE 5 MG/5ML IV SOLN
INTRAVENOUS | Status: DC | PRN
  Administered 2018-02-02: 2 mg via INTRAVENOUS

## 2018-02-02 MED ORDER — MIDAZOLAM HCL 2 MG/2ML IJ SOLN
INTRAMUSCULAR | Status: DC | PRN
  Administered 2018-02-02: 2 mg via INTRAVENOUS

## 2018-02-02 MED ORDER — PROPOFOL 500 MG/50ML IV EMUL
INTRAVENOUS | Status: DC | PRN
  Administered 2018-02-02: 150 ug/kg/min via INTRAVENOUS

## 2018-02-02 MED ORDER — ACETAMINOPHEN 500 MG PO TABS
1000.0000 mg | ORAL_TABLET | Freq: Four times a day (QID) | ORAL | Status: DC
  Administered 2018-02-02 – 2018-02-06 (×12): 1000 mg via ORAL
  Filled 2018-02-02 (×13): qty 2

## 2018-02-02 MED ORDER — DEXAMETHASONE SODIUM PHOSPHATE 10 MG/ML IJ SOLN
INTRAMUSCULAR | Status: AC
  Filled 2018-02-02: qty 1

## 2018-02-02 MED ORDER — BUPIVACAINE LIPOSOME 1.3 % IJ SUSP
INTRAMUSCULAR | Status: AC
  Filled 2018-02-02: qty 20

## 2018-02-02 MED ORDER — METRONIDAZOLE IN NACL 5-0.79 MG/ML-% IV SOLN
500.0000 mg | Freq: Once | INTRAVENOUS | Status: DC
  Filled 2018-02-02: qty 100

## 2018-02-02 MED ORDER — ONDANSETRON HCL 4 MG/2ML IJ SOLN
INTRAMUSCULAR | Status: AC
  Filled 2018-02-02: qty 2

## 2018-02-02 MED ORDER — SUGAMMADEX SODIUM 200 MG/2ML IV SOLN
INTRAVENOUS | Status: DC | PRN
  Administered 2018-02-02: 200 mg via INTRAVENOUS

## 2018-02-02 SURGICAL SUPPLY — 48 items
"PENCIL ELECTRO HAND CTR " (MISCELLANEOUS) ×2 IMPLANT
BLADE SURG 15 STRL LF DISP TIS (BLADE) ×2 IMPLANT
BLADE SURG 15 STRL SS (BLADE) ×2
CANISTER SUCT 1200ML W/VALVE (MISCELLANEOUS) ×4 IMPLANT
CHLORAPREP W/TINT 26ML (MISCELLANEOUS) ×4 IMPLANT
CLEANER CAUTERY TIP 5X5 PAD (MISCELLANEOUS) ×2 IMPLANT
CNTNR SPEC 2.5X3XGRAD LEK (MISCELLANEOUS)
CONT SPEC 4OZ STER OR WHT (MISCELLANEOUS)
CONTAINER SPEC 2.5X3XGRAD LEK (MISCELLANEOUS) ×2 IMPLANT
COVER WAND RF STERILE (DRAPES) ×2 IMPLANT
DERMABOND ADVANCED (GAUZE/BANDAGES/DRESSINGS) ×2
DERMABOND ADVANCED .7 DNX12 (GAUZE/BANDAGES/DRESSINGS) ×2 IMPLANT
DEVICE TROCAR PUNCTURE CLOSURE (ENDOMECHANICALS) ×4 IMPLANT
DRSG OPSITE POSTOP 4X10 (GAUZE/BANDAGES/DRESSINGS) ×2 IMPLANT
DRSG TEGADERM 2-3/8X2-3/4 SM (GAUZE/BANDAGES/DRESSINGS) ×4 IMPLANT
DRSG TELFA 4X3 1S NADH ST (GAUZE/BANDAGES/DRESSINGS) ×4 IMPLANT
ELECT REM PT RETURN 9FT ADLT (ELECTROSURGICAL) ×4
ELECTRODE REM PT RTRN 9FT ADLT (ELECTROSURGICAL) ×2 IMPLANT
GLOVE BIO SURGEON STRL SZ7 (GLOVE) ×4 IMPLANT
GOWN STRL REUS W/ TWL LRG LVL3 (GOWN DISPOSABLE) ×4 IMPLANT
GOWN STRL REUS W/TWL LRG LVL3 (GOWN DISPOSABLE) ×4
HANDLE YANKAUER SUCT BULB TIP (MISCELLANEOUS) ×2 IMPLANT
IRRIGATION STRYKERFLOW (MISCELLANEOUS) ×2 IMPLANT
IRRIGATOR STRYKERFLOW (MISCELLANEOUS)
IV NS 1000ML (IV SOLUTION) ×2
IV NS 1000ML BAXH (IV SOLUTION) ×2 IMPLANT
L-HOOK LAP DISP 36CM (ELECTROSURGICAL) ×4
LHOOK LAP DISP 36CM (ELECTROSURGICAL) ×2 IMPLANT
NEEDLE HYPO 22GX1.5 SAFETY (NEEDLE) ×4 IMPLANT
PACK LAP CHOLECYSTECTOMY (MISCELLANEOUS) ×4 IMPLANT
PAD CLEANER CAUTERY TIP 5X5 (MISCELLANEOUS) ×2
PENCIL ELECTRO HAND CTR (MISCELLANEOUS) ×8 IMPLANT
SCISSORS METZENBAUM CVD 33 (INSTRUMENTS) ×2 IMPLANT
SLEEVE ADV FIXATION 5X100MM (TROCAR) ×8 IMPLANT
SPONGE GAUZE 2X2 8PLY STER LF (GAUZE/BANDAGES/DRESSINGS) ×2
SPONGE GAUZE 2X2 8PLY STRL LF (GAUZE/BANDAGES/DRESSINGS) ×2 IMPLANT
SPONGE LAP 18X18 RF (DISPOSABLE) ×4 IMPLANT
STAPLER SKIN PROX 35W (STAPLE) ×2 IMPLANT
SUT MNCRL AB 4-0 PS2 18 (SUTURE) ×4 IMPLANT
SUT PDS AB 0 CT1 27 (SUTURE) ×2 IMPLANT
SUT VICRYL 0 AB UR-6 (SUTURE) ×4 IMPLANT
SYR 20CC LL (SYRINGE) ×4 IMPLANT
TRAP SPECIMEN MUCOUS 40CC (MISCELLANEOUS) ×2 IMPLANT
TROCAR XCEL BLUNT TIP 100MML (ENDOMECHANICALS) ×4 IMPLANT
TROCAR Z-THREAD OPTICAL 5X100M (TROCAR) ×4 IMPLANT
TUBING CONNECTING 10 (TUBING) ×1 IMPLANT
TUBING CONNECTING 10' (TUBING) ×1
TUBING INSUFFLATION (TUBING) ×4 IMPLANT

## 2018-02-02 NOTE — Transfer of Care (Signed)
Immediate Anesthesia Transfer of Care Note  Patient: Anne FlavorsLori D Brathwaite  Procedure(s) Performed: LAPAROSCOPY DIAGNOSTIC CONVERTED TO OPEN (N/A ) INTERNAL HERNIA  REDUCTION  Patient Location: PACU  Anesthesia Type:General  Level of Consciousness: awake and alert   Airway & Oxygen Therapy: Patient Spontanous Breathing and Patient connected to nasal cannula oxygen  Post-op Assessment: Report given to RN and Post -op Vital signs reviewed and stable  Post vital signs: Reviewed and stable  Last Vitals:  Vitals Value Taken Time  BP 146/77 02/02/2018  7:26 PM  Temp 36.4 C 02/02/2018  7:26 PM  Pulse 72 02/02/2018  7:31 PM  Resp 11 02/02/2018  7:31 PM  SpO2 100 % 02/02/2018  7:31 PM  Vitals shown include unvalidated device data.  Last Pain:  Vitals:   02/02/18 1926  TempSrc:   PainSc: Asleep      Patients Stated Pain Goal: 0 (02/02/18 1233)  Complications: No apparent anesthesia complications

## 2018-02-02 NOTE — Op Note (Signed)
PROCEDURES Diagnostic laparoscopy with conversion to laparotomy reduction of internal hernia and Lysis of adhesions  Pre-operative Diagnosis: Internal Hernia  Post-operative Diagnosis: Same  Surgeon: Merri Rayiego F Pabon    Anesthesia: General endotracheal anesthesia  ASA Class: 2  Surgeon: Sterling Bigiego Pabon , MD FACS  Anesthesia: Gen. with endotracheal tube  Findings: Internal hernia from an inflammatory band from the omentum to the mid transverse colon.  Inflammatory response around colon, from diverticulitis   Estimated Blood Loss: 20cc         Drains: none              Complications: none               Condition: stable  Procedure Details  The patient was seen again in the Holding Room. The benefits, complications, treatment options, and expected outcomes were discussed with the patient. The risks of bleeding, infection, recurrence of symptoms, failure to resolve symptoms,  bowel injury, any of which could require further surgery were reviewed with the patient.   The patient was taken to Operating Room, identified as Anne James and the procedure verified.  A Time Out was held and the above information confirmed.  Prior to the induction of general anesthesia, antibiotic prophylaxis was administered. VTE prophylaxis was in place. General endotracheal anesthesia was then administered and tolerated well. After the induction, the abdomen was prepped with Chloraprep and draped in the sterile fashion. The patient was positioned in the supine position.  If umbilical incision was created after the fascia was elevated and a Hassan trocar was placed.  Pneumoperitoneum was obtained with no hemodynamic changes.  2 additional 5 mm ports were placed to the right of the abdomen.  Diagnostic laparoscopy revealed an obvious internal hernia with inflammatory response and a transition point around the terminal ileum.  Because of the difficult anatomy and challenging anatomy from the previous gastric bypass  I decided to perform a laparotomy.  This was done extending my previous port incision with a 10 blade knife.  Electrocautery was used to dissect through the cutaneous tissue and incised the fascia.  The Balfour retractor was placed and the obvious area of the internal hernia was reduced.  The internal hernia was caused from a band from the omentum to the transverse colon.  There was significant inflammatory response around the transverse colon very suspicious for episode of diverticulitis.  We were able to assess the jejunojejunostomy as well as the gastrojejunostomy and they were patent.  There was no other defects or other internal hernias related to the gastric bypass. Bowel was examined and was found to be viable. Other intra-abdominal pathology was visualized. There was Excellent hemostasis at the conclusion of the case   We close the abdomen with a 0 PDS suture in a running fashion and the skin was closed with staples. Liposomal Marcaine was injected on all incision sites under direct visualization.   Needle and laparotomy count were correct and there were no immediate occasions  Sterling Bigiego Pabon, MD, FACS

## 2018-02-02 NOTE — Interval H&P Note (Signed)
History and Physical Interval Note:  02/02/2018 12:15 PM  Anne FlavorsLori D Stankovich  has presented today for surgery, with the diagnosis of N & V, ABDOMINAL PAIN  The various methods of treatment have been discussed with the patient and family. After consideration of risks, benefits and other options for treatment, the patient has consented to  Procedure(s): ESOPHAGOGASTRODUODENOSCOPY (EGD) WITH PROPOFOL (N/A) as a surgical intervention .  The patient's history has been reviewed, patient examined, no change in status, stable for surgery.  I have reviewed the patient's chart and labs.  Questions were answered to the patient's satisfaction.     [place], [place]

## 2018-02-02 NOTE — Transfer of Care (Signed)
Immediate Anesthesia Transfer of Care Note  Patient: Anne James  Procedure(s) Performed: ESOPHAGOGASTRODUODENOSCOPY (EGD) WITH PROPOFOL (N/A )  Patient Location: PACU  Anesthesia Type:General  Level of Consciousness: awake and alert   Airway & Oxygen Therapy: Pt spontaneously breathing  Post-op Assessment: Report given to RN and Post -op Vital signs reviewed and stable  Post vital signs: Reviewed and stable  Last Vitals:  Vitals Value Taken Time  BP    Temp    Pulse 77 02/02/2018  1:02 PM  Resp 9 02/02/2018  1:02 PM  SpO2 95 % 02/02/2018  1:02 PM  Vitals shown include unvalidated device data.  Last Pain:  Vitals:   02/02/18 1233  TempSrc: Tympanic  PainSc: 4       Patients Stated Pain Goal: 0 (02/02/18 1233)  Complications: No apparent anesthesia complications

## 2018-02-02 NOTE — Anesthesia Post-op Follow-up Note (Signed)
Anesthesia QCDR form completed.        

## 2018-02-02 NOTE — Anesthesia Preprocedure Evaluation (Signed)
Anesthesia Evaluation  Patient identified by MRN, date of birth, ID band Patient awake    Reviewed: Allergy & Precautions, NPO status , Patient's Chart, lab work & pertinent test results, reviewed documented beta blocker date and time   Airway Mallampati: III  TM Distance: >3 FB     Dental  (+) Chipped   Pulmonary           Cardiovascular hypertension, Pt. on medications      Neuro/Psych PSYCHIATRIC DISORDERS Anxiety Depression    GI/Hepatic GERD  ,  Endo/Other    Renal/GU      Musculoskeletal  (+) Arthritis ,   Abdominal   Peds  Hematology  (+) anemia ,   Anesthesia Other Findings Obese. Gastric bypass 2002. Hx of PE.  Reproductive/Obstetrics                             Anesthesia Physical Anesthesia Plan  ASA: III  Anesthesia Plan: General   Post-op Pain Management:    Induction: Intravenous  PONV Risk Score and Plan:   Airway Management Planned:   Additional Equipment:   Intra-op Plan:   Post-operative Plan:   Informed Consent: I have reviewed the patients History and Physical, chart, labs and discussed the procedure including the risks, benefits and alternatives for the proposed anesthesia with the patient or authorized representative who has indicated his/her understanding and acceptance.     Plan Discussed with: CRNA  Anesthesia Plan Comments:         Anesthesia Quick Evaluation

## 2018-02-02 NOTE — Anesthesia Procedure Notes (Signed)
Procedure Name: Intubation Date/Time: 02/02/2018 6:16 PM Performed by: Jonna Clark, CRNA Pre-anesthesia Checklist: Patient identified, Patient being monitored, Timeout performed, Emergency Drugs available and Suction available Patient Re-evaluated:Patient Re-evaluated prior to induction Oxygen Delivery Method: Circle system utilized Preoxygenation: Pre-oxygenation with 100% oxygen Induction Type: IV induction, Rapid sequence and Cricoid Pressure applied Ventilation: Mask ventilation without difficulty Laryngoscope Size: Mac and 3 Grade View: Grade I Tube type: Oral Tube size: 7.0 mm Number of attempts: 1 Airway Equipment and Method: Stylet Placement Confirmation: ETT inserted through vocal cords under direct vision,  positive ETCO2 and breath sounds checked- equal and bilateral Secured at: 21 cm Tube secured with: Tape Dental Injury: Teeth and Oropharynx as per pre-operative assessment

## 2018-02-02 NOTE — Anesthesia Preprocedure Evaluation (Addendum)
Anesthesia Evaluation  Patient identified by MRN, date of birth, ID band Patient awake    Reviewed: Allergy & Precautions, H&P , NPO status , Patient's Chart, lab work & pertinent test results  History of Anesthesia Complications Negative for: history of anesthetic complications  Airway Mallampati: III  TM Distance: <3 FB Neck ROM: full    Dental  (+) Chipped   Pulmonary neg pulmonary ROS, neg shortness of breath,           Cardiovascular Exercise Tolerance: Good hypertension, (-) angina(-) Past MI and (-) DOE      Neuro/Psych PSYCHIATRIC DISORDERS negative neurological ROS  negative psych ROS   GI/Hepatic Neg liver ROS, GERD  Medicated and Controlled,  Endo/Other  negative endocrine ROS  Renal/GU      Musculoskeletal  (+) Arthritis ,   Abdominal   Peds  Hematology negative hematology ROS (+)   Anesthesia Other Findings Past Medical History: No date: Anemia     Comment:  low iron No date: Anxiety No date: Arthritis     Comment:  bilateral, right>left No date: Chicken pox No date: Depression No date: Eating disorder No date: GERD (gastroesophageal reflux disease) 09/17/2016: History of endometrial ablation     Comment:  In 2006. No periods since 2006. 2002: History of pulmonary embolus (PE)     Comment:  after gastric bypass surgery No date: Hypertension No date: Phlebitis No date: Primary localized osteoarthritis of left knee 01/06/2016: Primary localized osteoarthritis of right knee No date: Pulmonary embolism (HCC)     Comment:  after gastric bypass 06/29/2015: S/P total knee arthroplasty, left  Past Surgical History: 09/18/2016: CHOLECYSTECTOMY; N/A     Comment:  Procedure: LAPAROSCOPIC CHOLECYSTECTOMY;  Surgeon:               Leafy RoPabon, Diego F, MD;  Location: [facility] ORS;  Service:               General;  Laterality: N/A; 2002: GASTRIC BYPASS     [facility] Bucker[facility] No date: NOVASURE  ABLATION 06/29/2015: TOTAL KNEE ARTHROPLASTY; Left     Comment:  Procedure: LEFT TOTAL KNEE ARTHROPLASTY;  Surgeon:               Salvatore Marvelobert Wainer, MD;  Location: [facility] OR;  Service:               Orthopedics;  Laterality: Left; 01/18/2016: TOTAL KNEE ARTHROPLASTY; Right     Comment:  Procedure: TOTAL KNEE ARTHROPLASTY;  Surgeon: Salvatore Marvelobert               Wainer, MD;  [facility];  Service: Orthopedics;                Laterality: Right; No date: TUBAL LIGATION  BMI    Body Mass Index:  33.28 kg/m      Reproductive/Obstetrics negative OB ROS                             Anesthesia Physical Anesthesia Plan  ASA: III  Anesthesia Plan: General ETT, Rapid Sequence and Cricoid Pressure   Post-op Pain Management:    Induction: Intravenous  PONV Risk Score and Plan: Ondansetron, Dexamethasone, Midazolam and Treatment may vary due to age or medical condition  Airway Management Planned: Oral ETT  Additional Equipment:   Intra-op Plan:   Post-operative Plan: Extubation in OR  Informed Consent: I have reviewed the patients History and Physical,  chart, labs and discussed the procedure including the risks, benefits and alternatives for the proposed anesthesia with the patient or authorized representative who has indicated his/her understanding and acceptance.   Dental Advisory Given  Plan Discussed with: Anesthesiologist, CRNA and Surgeon  Anesthesia Plan Comments: (Patient consented for risks of anesthesia including but not limited to:  - adverse reactions to medications - damage to teeth, lips or other oral mucosa - sore throat or hoarseness - Damage to heart, brain, lungs or loss of life  Patient voiced understanding.)       Anesthesia Quick Evaluation

## 2018-02-02 NOTE — Anesthesia Procedure Notes (Signed)
Performed by: Anahid Eskelson, CRNA Pre-anesthesia Checklist: Patient identified, Emergency Drugs available, Suction available, Patient being monitored and Timeout performed Patient Re-evaluated:Patient Re-evaluated prior to induction Oxygen Delivery Method: Nasal cannula Induction Type: IV induction       

## 2018-02-02 NOTE — Op Note (Signed)
[facility] Gastroenterology Patient Name: Anne James Procedure Date: 02/02/2018 12:48 PM MRN: [id number] Account #: [id number] Date of Birth: [birth year] Admit Type: Inpatient Age: 47 Room: [facility] ENDO ROOM 1 Gender: Female Note Status: Finalized Procedure:            Upper GI endoscopy Indications:          Epigastric abdominal pain, Abnormal CT of the GI tract,                        Persistent vomiting of unknown cause Providers:            Boykin Nearing. Norma Fredrickson MD, MD Referring MD:         Yehuda Mao. Birdie Sons (Referring MD) Medicines:            Propofol per Anesthesia Complications:        No immediate complications. Procedure:            Pre-Anesthesia Assessment:                       - The risks and benefits of the procedure and the                        sedation options and risks were discussed with the                        patient. All questions were answered and informed                        consent was obtained.                       - Patient identification and proposed procedure were                        verified prior to the procedure by the nurse. The                        procedure was verified in the procedure room.                       - ASA Grade Assessment: III - A patient with severe                        systemic disease.                       - After reviewing the risks and benefits, the patient                        was deemed in satisfactory condition to undergo the                        procedure.                       After obtaining informed consent, the endoscope was                        passed under direct vision. Throughout the procedure,  the patient's blood pressure, pulse, and oxygen                        saturations were monitored continuously. The Endoscope                        was introduced through the mouth, and advanced to the                        jejunum. The upper GI endoscopy was  accomplished                        without difficulty. The patient tolerated the procedure                        well. Findings:      The examined esophagus was normal.      Evidence of a gastric bypass was found. A gastric pouch with a small       size was found. The staple line appeared intact. The gastrojejunal       anastomosis was characterized by healthy appearing mucosa, an intact       appearance and the presence of no stomal ulceration. This was traversed.       The pouch-to-jejunum limb was characterized by healthy appearing mucosa.       The jejunojejunal anastomosis was characterized by healthy appearing       mucosa. The duodenum-to-jejunum limb was not examined as it could not be       found.      The exam was otherwise without abnormality. Impression:           - Normal esophagus.                       - Gastric bypass with a small-sized pouch and intact                        staple line. Gastrojejunal anastomosis characterized by                        no stomal ulceration, healthy appearing mucosa and an                        intact appearance.                       - The examination was otherwise normal.                       - No specimens collected. Recommendation:       - Return patient to hospital ward for ongoing care.                       - Return patient to hospital ward prior to surgery.                       - Resume previous diet.                       - NPO.                       - Continue  present medications. Procedure Code(s):    --- Professional ---                       [phone], Esophagogastroduodenoscopy, flexible, transoral;                        diagnostic, including collection of specimen(s) by                        brushing or washing, when performed (separate procedure) Diagnosis Code(s):    --- Professional ---                       R93.3, Abnormal findings on diagnostic imaging of other                        parts of digestive tract                        R11.10, Vomiting, unspecified                       R10.13, Epigastric pain                       Z98.84, Bariatric surgery status CPT copyright 2018 [employer]. All rights reserved. The codes documented in this report are preliminary and upon coder review may  be revised to meet current compliance requirements. Stanton Kidneyeodoro K Khali Albanese MD, MD 02/02/2018 1:00:14 PM This report has been signed electronically. Number of Addenda: 0 Note Initiated On: 02/02/2018 12:48 PM      [facility]

## 2018-02-02 NOTE — Progress Notes (Signed)
[facility] Progress Note  1 Day Post-Op  Subjective: No acute events overnight. This morning she reports her abdominal pain is again a 2 out of 10 however last night she had clear liquids which caused her an increase in her pain and nausea. Denied nay fevers, chills, or emesis. Is currently NPO awaiting EGD around noon.   Objective: Vital signs in last 24 hours: Temp:  [97.7 F (36.5 C)-98.2 F (36.8 C)] 97.7 F (36.5 C) (11/22 0512) Pulse Rate:  [57-76] 57 (11/22 0512) Resp:  [12-20] 12 (11/22 0512) BP: (100-121)/(55-65) 100/55 (11/22 0512) SpO2:  [97 %-100 %] 100 % (11/22 0512) Last BM Date: 02/01/18  Intake/Output from previous day: 11/21 0701 - 11/22 0700 In: 1150.8 [I.V.:887.2; IV Piggyback:263.6] Out: -  Intake/Output this shift: No intake/output data recorded.  PE: Gen:  Alert, NAD, pleasant Pulm:  Normal effort Abd: Soft, non-tender, non-distended Skin: warm and dry, no rashes  Psych: A&Ox3   Lab Results:  Recent Labs    02/01/18 0552 02/02/18 0444  WBC 6.6 5.9  HGB 13.6 12.7  HCT 42.2 41.3  PLT 408* 370   BMET Recent Labs    02/01/18 0545 02/02/18 0444  NA 139 142  K 2.7* 3.6  CL 102 108  CO2 29 28  GLUCOSE 96 84  BUN 6 9  CREATININE 0.50 0.55  CALCIUM 8.8* 8.5*   PT/INR No results for input(s): LABPROT, INR in the last 72 hours. CMP     Component Value Date/Time   NA 142 02/02/2018 0444   NA 139 10/10/2017 1426   NA 139 08/23/2011 1744   K 3.6 02/02/2018 0444   K 4.0 08/23/2011 1744   CL 108 02/02/2018 0444   CL 105 08/23/2011 1744   CO2 28 02/02/2018 0444   CO2 26 08/23/2011 1744   GLUCOSE 84 02/02/2018 0444   GLUCOSE 84 08/23/2011 1744   BUN 9 02/02/2018 0444   BUN 14 10/10/2017 1426   BUN 15 08/23/2011 1744   CREATININE 0.55 02/02/2018 0444   CREATININE 0.84 08/23/2011 1744   CALCIUM 8.5 (L) 02/02/2018 0444   CALCIUM 8.8 08/23/2011 1744   PROT 7.4 01/31/2018 0841   PROT 6.6 10/10/2017 1426   PROT 7.0  08/23/2011 1744   ALBUMIN 3.9 01/31/2018 0841   ALBUMIN 4.1 10/10/2017 1426   ALBUMIN 3.6 08/23/2011 1744   AST 24 01/31/2018 0841   AST 23 08/23/2011 1744   ALT 15 01/31/2018 0841   ALT 17 08/23/2011 1744   ALKPHOS 64 01/31/2018 0841   ALKPHOS 74 08/23/2011 1744   BILITOT 0.9 01/31/2018 0841   BILITOT <0.2 10/10/2017 1426   BILITOT 0.5 08/23/2011 1744   GFRNONAA >60 02/02/2018 0444   GFRNONAA >60 08/23/2011 1744   GFRAA >60 02/02/2018 0444   GFRAA >60 08/23/2011 1744   Lipase     Component Value Date/Time   LIPASE 28 01/31/2018 0841       Studies/Results: No results found.  Anti-infectives: Anti-infectives (From admission, onward)   None       Assessment/Plan  Generalized Abdominal Pain (Intussusecption vs Internal hernia vs Marginal Ulceration) Anne James FiscalLori D Rhodesis a47 y.o.femalewith clinically stable but not improving generalized abdominal and nausea which is concerning for intussusception vs internal herniation vs marginal ulceration based on presentation, imaging findings, and surgical history which is complicated by pertinent comorbidities including iron deficiency anemia, generalized anxiety and depression, HTN, history of pulmonary embolism following gastric bypass in 2002, and obesity.   - NPO, IVF -  Pain Control PRN, Anti-Emetics PRN - Monitor abdominal examination and ongoing bowel function  - Given increase in pain with PO intake, will plan for diagnostic laparoscopy. Timing per Dr. Everlene Farrier. All risks, benefits, and alternatives to above procedure(s) were discussed with the patient, all of her questions were answered to her expressed satisfaction, patient expresses she wishes to proceed, and informed consent was obtained. - GI consult (Dr Norma Fredrickson) appreciated, plan for EGD later today             - Hypokalemia, resolvd - Mobilize - DVT Prophylaxis  -- Lynden Oxford , PA-C [facility] 02/02/2018, 10:11 AM [phone] M-F: 7am - 4pm

## 2018-02-02 NOTE — Anesthesia Postprocedure Evaluation (Signed)
Anesthesia Post Note  Patient: Anne James  Procedure(s) Performed: ESOPHAGOGASTRODUODENOSCOPY (EGD) WITH PROPOFOL (N/A )  Patient location during evaluation: Endoscopy Anesthesia Type: General Level of consciousness: awake and alert Pain management: pain level controlled Vital Signs Assessment: post-procedure vital signs reviewed and stable Respiratory status: spontaneous breathing, nonlabored ventilation, respiratory function stable and patient connected to nasal cannula oxygen Cardiovascular status: blood pressure returned to baseline and stable Postop Assessment: no apparent nausea or vomiting Anesthetic complications: no     Last Vitals:  Vitals:   02/02/18 1332 02/02/18 1342  BP: 108/62 109/65  Pulse: 63 64  Resp: 14 10  Temp:    SpO2: 96% 96%    Last Pain:  Vitals:   02/02/18 1302  TempSrc: Tympanic  PainSc:                  Takoda Siedlecki S

## 2018-02-03 LAB — CBC
HCT: 40.1 % (ref 36.0–46.0)
Hemoglobin: 12.6 g/dL (ref 12.0–15.0)
MCH: 28.1 pg (ref 26.0–34.0)
MCHC: 31.4 g/dL (ref 30.0–36.0)
MCV: 89.5 fL (ref 80.0–100.0)
PLATELETS: 372 10*3/uL (ref 150–400)
RBC: 4.48 MIL/uL (ref 3.87–5.11)
RDW: 14.6 % (ref 11.5–15.5)
WBC: 12.4 10*3/uL — AB (ref 4.0–10.5)
nRBC: 0 % (ref 0.0–0.2)

## 2018-02-03 LAB — BASIC METABOLIC PANEL
ANION GAP: 6 (ref 5–15)
BUN: 8 mg/dL (ref 6–20)
CO2: 28 mmol/L (ref 22–32)
Calcium: 8.3 mg/dL — ABNORMAL LOW (ref 8.9–10.3)
Chloride: 105 mmol/L (ref 98–111)
Creatinine, Ser: 0.53 mg/dL (ref 0.44–1.00)
GFR calc Af Amer: >60 mL/min (ref >60)
GLUCOSE: 145 mg/dL — AB (ref 70–99)
POTASSIUM: 3.7 mmol/L (ref 3.5–5.1)
Sodium: 139 mmol/L (ref 135–145)

## 2018-02-03 MED ORDER — TRAMADOL HCL 50 MG PO TABS
50.0000 mg | ORAL_TABLET | Freq: Four times a day (QID) | ORAL | Status: DC | PRN
  Administered 2018-02-04: 50 mg via ORAL
  Filled 2018-02-03: qty 1

## 2018-02-03 MED ORDER — OXYCODONE HCL 5 MG PO TABS: 10.0000 mg | ORAL_TABLET | ORAL | Status: DC | PRN

## 2018-02-03 NOTE — Anesthesia Postprocedure Evaluation (Signed)
Anesthesia Post Note  Patient: Anne James  Procedure(s) Performed: LAPAROSCOPY DIAGNOSTIC CONVERTED TO OPEN (N/A ) INTERNAL HERNIA  REDUCTION  Patient location during evaluation: PACU Anesthesia Type: General Level of consciousness: awake and alert Pain management: pain level controlled Vital Signs Assessment: post-procedure vital signs reviewed and stable Respiratory status: spontaneous breathing, nonlabored ventilation, respiratory function stable and patient connected to nasal cannula oxygen Cardiovascular status: blood pressure returned to baseline and stable Postop Assessment: no apparent nausea or vomiting Anesthetic complications: no     Last Vitals:  Vitals:   02/02/18 2235 02/03/18 0424  BP: 117/74 139/74  Pulse: 89 73  Resp: 16 16  Temp: 36.9 C 36.6 C  SpO2: 98% 98%    Last Pain:  Vitals:   02/03/18 0425  TempSrc:   PainSc: 8                  Cleda MccreedyJoseph K Piscitello

## 2018-02-03 NOTE — Progress Notes (Signed)
CC: SBO, post-op day 1 from diagnostic laparoscopy converted to laparotomy with reduction of internal hernia and lysis of adhesions; also had diverticulitis appreciated during procedure.  Subjective: Tolerated operation well, pain well controlled. Tolerated liquid diet without pain, nausea, vomiting.  Objective: Vital signs in last 24 hours: Temp:  [97.5 F (36.4 C)-98.4 F (36.9 C)] 97.9 F (36.6 C) (11/23 0424) Pulse Rate:  [63-89] 73 (11/23 0424) Resp:  [10-16] 16 (11/23 0424) BP: (105-146)/(48-77) 139/74 (11/23 0424) SpO2:  [93 %-100 %] 98 % (11/23 0424) Last BM Date: 01/31/18  Intake/Output from previous day: 11/22 0701 - 11/23 0700 In: 2764.5 [I.V.:2764.5] Out: 1010 [Urine:1000; Blood:10] Intake/Output this shift: Total I/O In: -  Out: 350 [Urine:350]  Physical exam:  Physical Exam  Constitutional: She is oriented to person, place, and time.  HENT:  Head: Normocephalic and atraumatic.  Eyes: Pupils are equal, round, and reactive to light.  Neck: Normal range of motion.  Cardiovascular: Normal rate.  Pulmonary/Chest: Effort normal.  Abdominal: Soft.  Appropriately tender to palpation. Dressings in place.  Small amount of old blood on honeycomb dressing.  No peritonitis.  Neurological: She is alert and oriented to person, place, and time.  Skin: Skin is warm and dry.    Lab Results: CBC  Recent Labs    02/02/18 0444 02/03/18 0652  WBC 5.9 12.4*  HGB 12.7 12.6  HCT 41.3 40.1  PLT 370 372   BMET Recent Labs    02/02/18 0444 02/03/18 0652  NA 142 139  K 3.6 3.7  CL 108 105  CO2 28 28  GLUCOSE 84 145*  BUN 9 8  CREATININE 0.55 0.53  CALCIUM 8.5* 8.3*   PT/INR No results for input(s): LABPROT, INR in the last 72 hours. ABG No results for input(s): PHART, HCO3 in the last 72 hours.  Invalid input(s): PCO2, PO2  Studies/Results: Dg Abd Portable 2v  Result Date: 02/02/2018 CLINICAL DATA:  Followup jejunal intussusception without obstruction.  EXAM: PORTABLE ABDOMEN - 2 VIEW COMPARISON:  Abdomen and pelvis CT dated 01/31/2018. FINDINGS: Normal bowel gas pattern without free peritoneal air. Multiple mid and upper abdominal surgical clips. Thoracolumbar degenerative changes and scoliosis. IMPRESSION: No acute abnormality. Electronically Signed   By: Beckie SaltsSteven  Reid M.D.   On: 02/02/2018 10:54    Anti-infectives: Anti-infectives (From admission, onward)   Start     Dose/Rate Route Frequency Ordered Stop   02/02/18 2200  piperacillin-tazobactam (ZOSYN) IVPB 3.375 g  Status:  Discontinued     3.375 g 100 mL/hr over 30 Minutes Intravenous Every 8 hours 02/02/18 2116 02/02/18 2122   02/02/18 2200  piperacillin-tazobactam (ZOSYN) IVPB 3.375 g     3.375 g 12.5 mL/hr over 240 Minutes Intravenous Every 8 hours 02/02/18 2122     02/02/18 1900  metroNIDAZOLE (FLAGYL) IVPB 500 mg  Status:  Discontinued     500 mg 100 mL/hr over 60 Minutes Intravenous  Once 02/02/18 1855 02/02/18 2052      Assessment/Plan:  47 y/o F POD 1 from surgery for bowel obstruction due to internal hernia.  Doing well. -Advance diet to full liquids -Ambulate -Cont. IV abx, for now.  Monitor WBC (current elevation may be simply reactive).   Duanne GuessJennifer Tasheema Perrone  02/03/2018  12:16 PM

## 2018-02-04 ENCOUNTER — Encounter: Payer: Self-pay | Admitting: Surgery

## 2018-02-04 LAB — CBC
HEMATOCRIT: 40.9 % (ref 36.0–46.0)
Hemoglobin: 12.5 g/dL (ref 12.0–15.0)
MCH: 28 pg (ref 26.0–34.0)
MCHC: 30.6 g/dL (ref 30.0–36.0)
MCV: 91.7 fL (ref 80.0–100.0)
NRBC: 0 % (ref 0.0–0.2)
PLATELETS: 372 10*3/uL (ref 150–400)
RBC: 4.46 MIL/uL (ref 3.87–5.11)
RDW: 14.7 % (ref 11.5–15.5)
WBC: 11 10*3/uL — ABNORMAL HIGH (ref 4.0–10.5)

## 2018-02-04 LAB — PHOSPHORUS: PHOSPHORUS: 2.9 mg/dL (ref 2.5–4.6)

## 2018-02-04 LAB — BASIC METABOLIC PANEL
ANION GAP: 4 — AB (ref 5–15)
BUN: 7 mg/dL (ref 6–20)
CO2: 29 mmol/L (ref 22–32)
Calcium: 8.3 mg/dL — ABNORMAL LOW (ref 8.9–10.3)
Chloride: 106 mmol/L (ref 98–111)
Creatinine, Ser: 0.69 mg/dL (ref 0.44–1.00)
GLUCOSE: 97 mg/dL (ref 70–99)
POTASSIUM: 4.1 mmol/L (ref 3.5–5.1)
Sodium: 139 mmol/L (ref 135–145)

## 2018-02-04 LAB — MAGNESIUM: Magnesium: 1.7 mg/dL (ref 1.7–2.4)

## 2018-02-04 MED ORDER — INFLUENZA VAC SPLIT QUAD 0.5 ML IM SUSY
0.5000 mL | PREFILLED_SYRINGE | INTRAMUSCULAR | Status: AC
  Administered 2018-02-06: 0.5 mL via INTRAMUSCULAR
  Filled 2018-02-04: qty 0.5

## 2018-02-04 MED ORDER — KETOROLAC TROMETHAMINE 15 MG/ML IJ SOLN
15.0000 mg | Freq: Four times a day (QID) | INTRAMUSCULAR | Status: DC
  Administered 2018-02-04 – 2018-02-06 (×8): 15 mg via INTRAVENOUS
  Filled 2018-02-04 (×8): qty 1

## 2018-02-04 NOTE — Progress Notes (Signed)
CC: SBO, post-op day 2 from diagnostic laparoscopy converted to laparotomy with reduction of internal hernia and lysis of adhesions; also had diverticulitis appreciated during procedure.  Subjective: Tolerated operation well, pain fairly well controlled. She doesn't like the way oxycodone makes her feel; the tramadol started yesterday seems to help, but isn't covering her completely. Tolerated full liquid diet, but states her appetite is poor.  She points out a sore spot at the cranial part of her incision..  Objective: Vital signs in last 24 hours: Temp:  [97.9 F (36.6 C)-98.1 F (36.7 C)] 98 F (36.7 C) (11/24 1357) Pulse Rate:  [74-83] 77 (11/24 1357) Resp:  [16-17] 17 (11/24 1357) BP: (111-136)/(63-73) 111/73 (11/24 1357) SpO2:  [97 %-98 %] 97 % (11/24 1357) Last BM Date: 02/01/18  Intake/Output from previous day: 11/23 0701 - 11/24 0700 In: 3794.7 [P.O.:240; I.V.:3419.2; IV Piggyback:135.5] Out: 925 [Urine:925] Intake/Output this shift: Total I/O In: 292.5 [P.O.:180; I.V.:112.5] Out: 400 [Urine:400]  Physical exam:  Physical Exam  Constitutional: She is oriented to person, place, and time.  HENT:  Head: Normocephalic and atraumatic.  Eyes: Pupils are equal, round, and reactive to light.  Neck: Normal range of motion.  Cardiovascular: Normal rate.  Pulmonary/Chest: Effort normal.  Abdominal: Soft.  At the area of tenderness noted by the patient, I feel a small knot that seems compatible with suture material. Small amount of old dark blood on honeycomb, slightly more than yesterday. No peritoneal signs.   Neurological: She is alert and oriented to person, place, and time.  Skin: Skin is warm and dry.    Lab Results: CBC  Recent Labs    02/03/18 0652 02/04/18 0548  WBC 12.4* 11.0*  HGB 12.6 12.5  HCT 40.1 40.9  PLT 372 372   BMET Recent Labs    02/03/18 0652 02/04/18 0548  NA 139 139  K 3.7 4.1  CL 105 106  CO2 28 29  GLUCOSE 145* 97  BUN 8 7   CREATININE 0.53 0.69  CALCIUM 8.3* 8.3*   PT/INR No results for input(s): LABPROT, INR in the last 72 hours. ABG No results for input(s): PHART, HCO3 in the last 72 hours.  Invalid input(s): PCO2, PO2  Studies/Results: No results found.  Anti-infectives: Anti-infectives (From admission, onward)   Start     Dose/Rate Route Frequency Ordered Stop   02/02/18 2200  piperacillin-tazobactam (ZOSYN) IVPB 3.375 g  Status:  Discontinued     3.375 g 100 mL/hr over 30 Minutes Intravenous Every 8 hours 02/02/18 2116 02/02/18 2122   02/02/18 2200  piperacillin-tazobactam (ZOSYN) IVPB 3.375 g     3.375 g 12.5 mL/hr over 240 Minutes Intravenous Every 8 hours 02/02/18 2122     02/02/18 1900  metroNIDAZOLE (FLAGYL) IVPB 500 mg  Status:  Discontinued     500 mg 100 mL/hr over 60 Minutes Intravenous  Once 02/02/18 1855 02/02/18 2052      Assessment/Plan:  47 y/o F POD 1 from surgery for bowel obstruction due to internal hernia.  Doing well, but could have better pain control. -Continue full liquids per her request -Toradol for additional non-narcotic pain management -Ambulate -Cont. IV abx, for now.  Monitor WBC (current elevation may be simply reactive).   Duanne GuessJennifer Nur Krasinski  02/04/2018  4:11 PM

## 2018-02-05 ENCOUNTER — Ambulatory Visit: Payer: Managed Care, Other (non HMO) | Admitting: Family Medicine

## 2018-02-05 ENCOUNTER — Encounter: Payer: Self-pay | Admitting: Internal Medicine

## 2018-02-05 MED ORDER — SODIUM CHLORIDE 0.9 % IV SOLN
INTRAVENOUS | Status: DC | PRN
  Administered 2018-02-05 (×2): 250 mL via INTRAVENOUS

## 2018-02-05 NOTE — Progress Notes (Signed)
[facility] Progress [facility]  3 Days Post-Op  Subjective: No acute events overnight. She notes some peri-incisional soreness but otherwise denied any fevers, chills, nausea, or emesis. She has been tolerating full liquids. Has had multiple episodes of diarrhea since 330 AM this morning. Mobilizing well.   Objective: Vital signs in last 24 hours: Temp:  [97.6 F (36.4 C)-98.2 F (36.8 C)] 97.6 F (36.4 C) (11/25 0415) Pulse Rate:  [69-87] 69 (11/25 0415) Resp:  [16-17] 16 (11/25 0415) BP: (111-134)/(63-73) 134/70 (11/25 0415) SpO2:  [97 %-100 %] 100 % (11/25 0415) Last BM Date: 02/01/18  Intake/Output from previous day: 11/24 0701 - 11/25 0700 In: 2592.6 [P.O.:180; I.V.:2312.5; IV Piggyback:100.1] Out: 750 [Urine:750] Intake/Output this shift: No intake/output data recorded.  PE: Gen:  Alert, NAD, pleasant Pulm:  Normal effort, clear to auscultation bilaterally Abd: Soft, non-tender, non-distended, midline laparotomy incision is intact, honeycomb in place with old blood drainage in the middle of the wound Skin: warm and dry, no rashes  Psych: A&Ox3   Lab Results:  Recent Labs    02/03/18 0652 02/04/18 0548  WBC 12.4* 11.0*  HGB 12.6 12.5  HCT 40.1 40.9  PLT 372 372   BMET Recent Labs    02/03/18 0652 02/04/18 0548  NA 139 139  K 3.7 4.1  CL 105 106  CO2 28 29  GLUCOSE 145* 97  BUN 8 7  CREATININE 0.53 0.69  CALCIUM 8.3* 8.3*   PT/INR No results for input(s): LABPROT, INR in the last 72 hours. CMP     Component Value Date/Time   NA 139 02/04/2018 0548   NA 139 10/10/2017 1426   NA 139 08/23/2011 1744   K 4.1 02/04/2018 0548   K 4.0 08/23/2011 1744   CL 106 02/04/2018 0548   CL 105 08/23/2011 1744   CO2 29 02/04/2018 0548   CO2 26 08/23/2011 1744   GLUCOSE 97 02/04/2018 0548   GLUCOSE 84 08/23/2011 1744   BUN 7 02/04/2018 0548   BUN 14 10/10/2017 1426   BUN 15 08/23/2011 1744   CREATININE 0.69 02/04/2018 0548   CREATININE 0.84  08/23/2011 1744   CALCIUM 8.3 (L) 02/04/2018 0548   CALCIUM 8.8 08/23/2011 1744   PROT 7.4 01/31/2018 0841   PROT 6.6 10/10/2017 1426   PROT 7.0 08/23/2011 1744   ALBUMIN 3.9 01/31/2018 0841   ALBUMIN 4.1 10/10/2017 1426   ALBUMIN 3.6 08/23/2011 1744   AST 24 01/31/2018 0841   AST 23 08/23/2011 1744   ALT 15 01/31/2018 0841   ALT 17 08/23/2011 1744   ALKPHOS 64 01/31/2018 0841   ALKPHOS 74 08/23/2011 1744   BILITOT 0.9 01/31/2018 0841   BILITOT <0.2 10/10/2017 1426   BILITOT 0.5 08/23/2011 1744   GFRNONAA >60 02/04/2018 0548   GFRNONAA >60 08/23/2011 1744   GFRAA >60 02/04/2018 0548   GFRAA >60 08/23/2011 1744   Lipase     Component Value Date/Time   LIPASE 28 01/31/2018 0841       Studies/Results: No results found.  Anti-infectives: Anti-infectives (From admission, onward)   Start     Dose/Rate Route Frequency Ordered Stop   02/02/18 2200  piperacillin-tazobactam (ZOSYN) IVPB 3.375 g  Status:  Discontinued     3.375 g 100 mL/hr over 30 Minutes Intravenous Every 8 hours 02/02/18 2116 02/02/18 2122   02/02/18 2200  piperacillin-tazobactam (ZOSYN) IVPB 3.375 g     3.375 g 12.5 mL/hr over 240 Minutes Intravenous Every 8 hours 02/02/18 2122  02/02/18 1900  metroNIDAZOLE (FLAGYL) IVPB 500 mg  Status:  Discontinued     500 mg 100 mL/hr over 60 Minutes Intravenous  Once 02/02/18 1855 02/02/18 2052       Assessment/Plan  Anne James is a 47 y.o. female who is overall doing well 3 days s/p exploratory laparotomy with reduction of internal hernia and lysis of adhesions     - Advance to soft diet, wean IVF  - Continue to monitor abdominal exam and ongoing bowel function  - Pain control, anti-emetics prn  - Encourage mobilization   - DVT Prophylaxis   - Discharge Planning: Likely home in next 24-48 hours   Lynden OxfordZachary Camyah Pultz , PA-C [facility] 02/05/2018, 10:05 AM [phone] M-F: 7am - 4pm

## 2018-02-06 ENCOUNTER — Telehealth: Payer: Self-pay | Admitting: Family Medicine

## 2018-02-06 MED ORDER — METRONIDAZOLE 500 MG PO TABS: 500.0000 mg | ORAL_TABLET | Freq: Three times a day (TID) | ORAL | 0 refills | Status: AC

## 2018-02-06 MED ORDER — CIPROFLOXACIN HCL 500 MG PO TABS: 500.0000 mg | ORAL_TABLET | Freq: Two times a day (BID) | ORAL | 0 refills | Status: AC

## 2018-02-06 MED ORDER — OXYCODONE HCL 10 MG PO TABS: 5.0000 mg | ORAL_TABLET | ORAL | 0 refills | Status: DC | PRN

## 2018-02-06 NOTE — Discharge Summary (Signed)
Discharge Summary  Patient ID: Anne James MRN: [id number] DOB/AGE: August 24, 1970 47 y.o.  Admit date: 01/31/2018 Discharge date: 02/06/2018  Discharge Diagnoses Internal Hernia  Consultants Gastroenterology (Dr Norma Fredricksonoledo)  Procedures EGD on 11/22 Diagnostic laparoscopy with conversion to laparotomy reduction of internal hernia and Lysis of adhesions on 11/22  HPI: Anne James is a 47 y.o. female who presents to [facility] ED  11/20 with 1 week of diffuse abdominal pain. She notes that she has had about 1 week of constant waxing and waning cramping abdominal pain. Nothing has seemed to make the pain better or worse. She has associated nausea, emesis, and decreased PO intake with the abdominal pain. No complaints of fever, chills, SOB, CP, or melena/hematochezia. Her abdominal surgical history is remarkable for gastric bypass in 2002 and cholecystectomy in 2018 with Dr. Everlene FarrierPabon. She was seen for this complaint in the ED twice in the last week and diagnosed with gastritis and managed accordingly. Work up in the ED today was concerning for possible intussusception involving a loop of jejunum.   Hospital Course: Anne James was admitted to general surgery and a consult to gastroenterology was placed for possible EGD. Patient remained NPO until 11/22 after failing trial of PO intake. Informed consent was obtained and documented, and patient underwent uneventful EGD (Dr Norma Fredricksonoledo, MD, 02/02/2018) followed by a diagnostic laparoscopy with conversion to laparotomy reduction of internal hernia and Lysis of adhesions (Dr Everlene FarrierPabon, MD 02/02/2018).  Post-operatively, patient's pain and nausea improved/resolved and advancement of patient's diet and ambulation were well-tolerated. The remainder of patient's hospital course was essentially unremarkable, and discharge planning was initiated accordingly with patient safely able to be discharged home with appropriate discharge instructions, pain control, and outpatient follow-up  after all of her questions were answered to her expressed satisfaction.   Discharge Condition: Good   Physical Examination:  Gen:  Alert, NAD, pleasant Pulm:  Normal effort, clear to auscultation bilaterally Abd: Soft, non-tender, non-distended, midline laparotomy incision is CDI without erythema or drainage Skin: warm and dry, no rashes  Psych: A&Ox3     Allergies as of 02/06/2018   No Known Allergies     Medication List    TAKE these medications   celecoxib 200 MG capsule Commonly known as:  CELEBREX Take 1 capsule (200 mg total) by mouth daily.   ciprofloxacin 500 MG tablet Commonly known as:  CIPRO Take 1 tablet (500 mg total) by mouth 2 (two) times daily for 5 days.   clonazePAM 0.5 MG tablet Commonly known as:  KLONOPIN Take 1 tablet (0.5 mg total) by mouth 2 (two) times daily as needed for anxiety.   famotidine 20 MG tablet Commonly known as:  PEPCID Take 1 tablet (20 mg total) by mouth 2 (two) times daily.   ferrous sulfate 325 (65 FE) MG EC tablet Take 1 tablet (325 mg total) by mouth 2 (two) times daily with a meal.   FLUoxetine 20 MG tablet Commonly known as:  PROZAC Take 1 tablet (20 mg total) by mouth daily.   FLUoxetine 40 MG capsule Commonly known as:  PROZAC TAKE 1 CAPSULE BY MOUTH  DAILY   fluticasone 50 MCG/ACT nasal spray Commonly known as:  FLONASE Place 2 sprays into both nostrils daily.   metroNIDAZOLE 500 MG tablet Commonly known as:  FLAGYL Take 1 tablet (500 mg total) by mouth 3 (three) times daily for 5 days.   multivitamin capsule Take 1 capsule by mouth daily.   ondansetron 4 MG disintegrating tablet  Commonly known as:  ZOFRAN-ODT Take 1 tablet (4 mg total) by mouth every 8 (eight) hours as needed for nausea or vomiting.   Oxycodone HCl 10 MG Tabs Take 0.5 tablets (5 mg total) by mouth every 4 (four) hours as needed for severe pain.   sucralfate 1 g tablet Commonly known as:  CARAFATE Take 1 tablet (1 g total) by mouth 4  (four) times daily.        Follow-up Information    Pabon, [place] F, MD. Schedule an appointment as soon as possible for a visit in 1 week(s).   Specialty:  General Surgery Why:  1 week follow up, s/p ex lap reduction internal hernia, needs staple removed Contact information: [address] [place] [address] [phone]           Signed: Lynden Oxford , PA-C [facility]  02/06/2018, 9:17 AM [phone] M-F: 7am - 4pm

## 2018-02-06 NOTE — Progress Notes (Signed)
Patient discharge teaching given, including activity, diet, follow-up appoints, and medications. Patient verbalized understanding of all discharge instructions. IV access was d/c'd. Vitals are stable. Skin is intact except as charted in most recent assessments. Pt to be escorted out by volunteer, to be driven home by family.  Shelitha Magley  

## 2018-02-06 NOTE — Discharge Instructions (Signed)
In addition to included general post-operative instructions for laparotomy,  Diet: Resume home diet.   Activity: No heavy lifting >20 pounds (children, pets, laundry, garbage) or strenuous activity until follow-up, but light activity and walking are encouraged. Do not drive or drink alcohol if taking narcotic pain medications.  Wound care: You may shower/get incision wet with soapy water and pat dry (do not rub incisions), but no baths or submerging incision underwater until follow-up.   Medications: Resume all home medications. For mild to moderate pain: acetaminophen (Tylenol) or ibuprofen/naproxen (if no kidney disease). Combining Tylenol with alcohol can substantially increase your risk of causing liver disease. Narcotic pain medications, if prescribed, can be used for severe pain, though may cause nausea, constipation, and drowsiness. Do not combine Tylenol and Percocet (or similar) within a 6 hour period as Percocet (and similar) contain(s) Tylenol. If you do not need the narcotic pain medication, you do not need to fill the prescription.  Call office [phone]) at any time if any questions, worsening pain, fevers/chills, bleeding, drainage from incision site, or other concerns.

## 2018-02-06 NOTE — Telephone Encounter (Signed)
TCM call attempted no answer left message to call office, patient needs HFU appointment . [facility] Nursemay schedule and advise how patient is feeling, and advise office.

## 2018-02-07 ENCOUNTER — Telehealth: Payer: Self-pay

## 2018-02-07 NOTE — Telephone Encounter (Signed)
Second attempt to reach patient for transitional care management.  No answer. Left a message on her voice mail to schedule hospital follow up with her doctor.  [facility] nurse may schedule and advise how patient is feeling, and advise office.

## 2018-02-12 ENCOUNTER — Ambulatory Visit (INDEPENDENT_AMBULATORY_CARE_PROVIDER_SITE_OTHER): Payer: Managed Care, Other (non HMO) | Admitting: Surgery

## 2018-02-12 ENCOUNTER — Other Ambulatory Visit: Payer: Self-pay

## 2018-02-12 ENCOUNTER — Encounter: Payer: Self-pay | Admitting: Surgery

## 2018-02-12 VITALS — BP 124/87 | HR 88 | Temp 97.7°F | Resp 14 | Ht 65.0 in | Wt 193.0 lb

## 2018-02-12 DIAGNOSIS — K458 Other specified abdominal hernia without obstruction or gangrene: Secondary | ICD-10-CM

## 2018-02-12 NOTE — Patient Instructions (Addendum)
Return in two weeks . Return to work on 02/19/2018. 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

## 2018-02-12 NOTE — Progress Notes (Signed)
S/p laparotomy for internal hernia Doing ok' Having some mild pain Taking PO  PE NAD Abd: soft, mild TTP, no peritonitis,. No infection. Staples removed  A/P Doing well Recovering slowly RTC 2 weeks No evidence of complications at this time

## 2018-02-15 NOTE — Progress Notes (Unsigned)
Patients job requesting more information for disability claim, information on dates for the patient has been provided and faxed over. Will be scanned into the chart.

## 2018-02-22 ENCOUNTER — Other Ambulatory Visit: Payer: Self-pay

## 2018-02-22 ENCOUNTER — Emergency Department
Admission: EM | Admit: 2018-02-22 | Discharge: 2018-02-23 | Disposition: A | Discharge status: Home / Self Care | Payer: Managed Care, Other (non HMO) | Attending: Emergency Medicine | Admitting: Emergency Medicine

## 2018-02-22 DIAGNOSIS — Z96652 Presence of left artificial knee joint: Secondary | ICD-10-CM | POA: Diagnosis not present

## 2018-02-22 DIAGNOSIS — R109 Unspecified abdominal pain: Secondary | ICD-10-CM | POA: Diagnosis not present

## 2018-02-22 DIAGNOSIS — R112 Nausea with vomiting, unspecified: Secondary | ICD-10-CM | POA: Diagnosis not present

## 2018-02-22 DIAGNOSIS — I10 Essential (primary) hypertension: Secondary | ICD-10-CM | POA: Insufficient documentation

## 2018-02-22 DIAGNOSIS — Z96651 Presence of right artificial knee joint: Secondary | ICD-10-CM | POA: Insufficient documentation

## 2018-02-22 DIAGNOSIS — R197 Diarrhea, unspecified: Secondary | ICD-10-CM | POA: Diagnosis not present

## 2018-02-22 DIAGNOSIS — Z9884 Bariatric surgery status: Secondary | ICD-10-CM | POA: Diagnosis not present

## 2018-02-22 DIAGNOSIS — Z79899 Other long term (current) drug therapy: Secondary | ICD-10-CM | POA: Diagnosis not present

## 2018-02-22 LAB — COMPREHENSIVE METABOLIC PANEL
ALBUMIN: 4.1 g/dL (ref 3.5–5.0)
ALT: 17 U/L (ref 0–44)
ANION GAP: 14 (ref 5–15)
AST: 25 U/L (ref 15–41)
Alkaline Phosphatase: 56 U/L (ref 38–126)
BILIRUBIN TOTAL: 0.6 mg/dL (ref 0.3–1.2)
BUN: 13 mg/dL (ref 6–20)
CO2: 18 mmol/L — ABNORMAL LOW (ref 22–32)
Calcium: 9.1 mg/dL (ref 8.9–10.3)
Chloride: 106 mmol/L (ref 98–111)
Creatinine, Ser: 0.67 mg/dL (ref 0.44–1.00)
GFR calc non Af Amer: >60 mL/min (ref >60)
Glucose, Bld: 117 mg/dL — ABNORMAL HIGH (ref 70–99)
Potassium: 3.6 mmol/L (ref 3.5–5.1)
SODIUM: 138 mmol/L (ref 135–145)
TOTAL PROTEIN: 7.2 g/dL (ref 6.5–8.1)

## 2018-02-22 LAB — CBC
HEMATOCRIT: 44 % (ref 36.0–46.0)
Hemoglobin: 13.6 g/dL (ref 12.0–15.0)
MCH: 27.8 pg (ref 26.0–34.0)
MCHC: 30.9 g/dL (ref 30.0–36.0)
MCV: 89.8 fL (ref 80.0–100.0)
NRBC: 0 % (ref 0.0–0.2)
PLATELETS: 355 10*3/uL (ref 150–400)
RBC: 4.9 MIL/uL (ref 3.87–5.11)
RDW: 15 % (ref 11.5–15.5)
WBC: 6 10*3/uL (ref 4.0–10.5)

## 2018-02-22 LAB — LIPASE, BLOOD: Lipase: 67 U/L — ABNORMAL HIGH (ref 11–51)

## 2018-02-22 MED ORDER — HALOPERIDOL LACTATE 5 MG/ML IJ SOLN
1.0000 mg | Freq: Once | INTRAMUSCULAR | Status: AC
  Administered 2018-02-23: 1 mg via INTRAVENOUS
  Filled 2018-02-22: qty 1

## 2018-02-22 MED ORDER — SODIUM CHLORIDE 0.9 % IV BOLUS
1000.0000 mL | Freq: Once | INTRAVENOUS | Status: AC
  Administered 2018-02-23: 1000 mL via INTRAVENOUS

## 2018-02-22 MED ORDER — DIPHENHYDRAMINE HCL 50 MG/ML IJ SOLN
12.5000 mg | INTRAMUSCULAR | Status: AC
  Administered 2018-02-23: 12.5 mg via INTRAVENOUS
  Filled 2018-02-22: qty 1

## 2018-02-22 NOTE — ED Triage Notes (Signed)
Pt had hernia surgery on 11/22. States diarrhea this AM. Vomiting the rest of the day, started an hour after lunch (chicken sandwich from [employer]). A&O, in wheelchair.

## 2018-02-23 ENCOUNTER — Emergency Department: Payer: Managed Care, Other (non HMO)

## 2018-02-23 ENCOUNTER — Telehealth: Payer: Self-pay | Admitting: *Deleted

## 2018-02-23 LAB — URINALYSIS, COMPLETE (UACMP) WITH MICROSCOPIC
BACTERIA UA: NONE SEEN
Bilirubin Urine: NEGATIVE
Glucose, UA: NEGATIVE mg/dL
KETONES UR: 20 mg/dL — AB
Leukocytes, UA: NEGATIVE
Nitrite: NEGATIVE
PROTEIN: NEGATIVE mg/dL
SPECIFIC GRAVITY, URINE: 1.039 — AB (ref 1.005–1.030)
pH: 5 (ref 5.0–8.0)

## 2018-02-23 LAB — LACTIC ACID, PLASMA
LACTIC ACID, VENOUS: 2 mmol/L — AB (ref 0.5–1.9)
Lactic Acid, Venous: 2.1 mmol/L (ref 0.5–1.9)

## 2018-02-23 LAB — CG4 I-STAT (LACTIC ACID): Lactic Acid, Venous: 1.04 mmol/L (ref 0.5–1.9)

## 2018-02-23 LAB — POCT PREGNANCY, URINE: Preg Test, Ur: NEGATIVE

## 2018-02-23 MED ORDER — METOCLOPRAMIDE HCL 5 MG/ML IJ SOLN
10.0000 mg | INTRAMUSCULAR | Status: AC
  Administered 2018-02-23: 10 mg via INTRAVENOUS
  Filled 2018-02-23: qty 2

## 2018-02-23 MED ORDER — ONDANSETRON HCL 4 MG/2ML IJ SOLN
4.0000 mg | INTRAMUSCULAR | Status: AC
  Administered 2018-02-23: 4 mg via INTRAVENOUS
  Filled 2018-02-23: qty 2

## 2018-02-23 MED ORDER — PROMETHAZINE HCL 25 MG RE SUPP: 25.0000 mg | Freq: Four times a day (QID) | RECTAL | 1 refills | Status: DC | PRN

## 2018-02-23 MED ORDER — SODIUM CHLORIDE 0.9 % IV BOLUS
1000.0000 mL | Freq: Once | INTRAVENOUS | Status: AC
  Administered 2018-02-23: 1000 mL via INTRAVENOUS

## 2018-02-23 MED ORDER — IOPAMIDOL (ISOVUE-300) INJECTION 61%
100.0000 mL | Freq: Once | INTRAVENOUS | Status: AC | PRN
  Administered 2018-02-23: 100 mL via INTRAVENOUS

## 2018-02-23 MED ORDER — SODIUM CHLORIDE 0.9 % IV BOLUS
500.0000 mL | Freq: Once | INTRAVENOUS | Status: AC
  Administered 2018-02-23: 500 mL via INTRAVENOUS

## 2018-02-23 NOTE — ED Notes (Addendum)
PO challenged per MD York CeriseForbach. Pt given Ginger Ale to drink.

## 2018-02-23 NOTE — Discharge Instructions (Signed)

## 2018-02-23 NOTE — ED Provider Notes (Signed)
[facility] Emergency Department Provider Note  ____________________________________________   First MD Initiated Contact with Patient 02/22/18 2306     (approximate)  I have reviewed the triage vital signs and the nursing notes.   HISTORY  Chief Complaint Emesis    HPI Anne James is a 47 y.o. female with medical history as listed below which notably includes a history about 17 years ago of bariatric surgery at [facility] and a surgery less than 1 month ago for an abdominal hernia.  She had had multiple episodes of intractable vomiting with some abdominal discomfort and was taken for laparoscopic surgery which was converted to open.  She has been doing generally well since the surgery but had acute onset of nausea and vomiting after eating a chicken sandwich today for lunch.  Her symptoms have been persistent and severe and she cannot take anything by mouth without throwing up.  She denies any abdominal pain, she just feels sore from all the vomiting.  She was not having additional abdominal pain prior to the onset of the vomiting and emesis.  She had one episode of loose stools when the vomiting began but has not had any more diarrhea.  She has felt very gassy and had a lot of burping but has not been passing much gas from below.  She denies fever/chills, chest pain, shortness of breath, and dysuria.  Past Medical History:  Diagnosis Date  . Anemia    low iron  . Anxiety   . 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 Chicken pox   . Depression   . Eating disorder   . GERD (gastroesophageal reflux disease)   . History of endometrial ablation 09/17/2016   In 2006. No periods since 2006.  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 Hypertension   . Phlebitis   . Primary localized osteoarthritis of left knee   . Primary localized osteoarthritis of right knee 01/06/2016  . 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 S/P  total knee arthroplasty, 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    Patient Active Problem List   Diagnosis Date Noted  . Intractable vomiting   . Intussusception of jejunum (HCC) 01/31/2018  . Ear popping, left 10/10/2017  . H/O bariatric surgery 12/14/2016  . Iron deficiency anemia secondary to inadequate dietary iron intake 12/14/2016  . Hot flashes 09/12/2016  . Status post right knee replacement 03/02/2016  . Primary localized osteoarthritis of right knee 01/06/2016  . Neck pain 11/30/2015  . 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 Primary localized osteoarthritis of left knee   . Anxiety and depression 06/08/2015  . Chronic fatigue 07/02/2014  . Right hip pain 02/23/2012  . Osteoarthritis of both knees 10/17/2011  . Obesity (BMI 30-39.9) 10/17/2011  . History of pulmonary embolus (PE) 03/14/2000    Past Surgical History:  Procedure Laterality Date  . CHOLECYSTECTOMY N/A 09/18/2016   Procedure: LAPAROSCOPIC CHOLECYSTECTOMY;  Surgeon: Leafy Ro, MD;  Location: [facility] ORS;  Service: General;  Laterality: N/A;  . EPIGASTRIC HERNIA REPAIR  02/02/2018   Procedure: INTERNAL HERNIA  REDUCTION;  Surgeon: Leafy Ro, MD;  Location: [facility] ORS;  Service: General;;  . ESOPHAGOGASTRODUODENOSCOPY (EGD) WITH PROPOFOL N/A 02/02/2018   Procedure: ESOPHAGOGASTRODUODENOSCOPY (EGD) WITH PROPOFOL;  Surgeon: Toledo, Boykin Nearing, MD;  Location: [facility] ENDOSCOPY;  Service: Gastroenterology;  Laterality: N/A;  . GASTRIC BYPASS  2002   [facility], Dr. Kennedy Bucker  .  LAPAROSCOPY N/A 02/02/2018   Procedure: LAPAROSCOPY DIAGNOSTIC CONVERTED TO OPEN;  Surgeon: Leafy Ro, MD;  Location: [facility] ORS;  Service: General;  Laterality: N/A;  . NOVASURE ABLATION    . TOTAL KNEE ARTHROPLASTY Left 06/29/2015   Procedure: LEFT TOTAL KNEE ARTHROPLASTY;  Surgeon: Salvatore Marvel, MD;  Location: [facility] OR;  Service: Orthopedics;  Laterality: Left;  . TOTAL KNEE ARTHROPLASTY Right 01/18/2016   Procedure: TOTAL KNEE ARTHROPLASTY;  Surgeon:  Salvatore Marvel, MD;  Location: [facility] OR;  Service: Orthopedics;  Laterality: Right;  . TUBAL LIGATION      Prior to Admission medications   Medication Sig Start Date End Date Taking? Authorizing Provider  celecoxib (CELEBREX) 200 MG capsule Take 1 capsule (200 mg total) by mouth daily. 11/01/17   Glori Luis, MD  clonazePAM (KLONOPIN) 0.5 MG tablet Take 1 tablet (0.5 mg total) by mouth 2 (two) times daily as needed for anxiety. 01/19/18   Glori Luis, MD  famotidine (PEPCID) 20 MG tablet Take 1 tablet (20 mg total) by mouth 2 (two) times daily. 01/22/18   Sharman Cheek, MD  ferrous sulfate 325 (65 FE) MG EC tablet Take 1 tablet (325 mg total) by mouth 2 (two) times daily with a meal. 12/23/16   Glori Luis, MD  FLUoxetine (PROZAC) 20 MG tablet Take 1 tablet (20 mg total) by mouth daily. 10/02/17   Glori Luis, MD  FLUoxetine (PROZAC) 40 MG capsule TAKE 1 CAPSULE BY MOUTH  DAILY 12/06/17   Glori Luis, MD  fluticasone [facility]) 50 MCG/ACT nasal spray Place 2 sprays into both nostrils daily. 07/13/16   Glori Luis, MD  Multiple Vitamin (MULTIVITAMIN) capsule Take 1 capsule by mouth daily.    [provider]  ondansetron (ZOFRAN ODT) 4 MG disintegrating tablet Take 1 tablet (4 mg total) by mouth every 8 (eight) hours as needed for nausea or vomiting. 01/30/18   Nita Sickle, MD  oxyCODONE 10 MG TABS Take 0.5 tablets (5 mg total) by mouth every 4 (four) hours as needed for severe pain. 02/06/18   Donovan Kail, PA-C  promethazine (PHENERGAN) 25 MG suppository Place 1 suppository (25 mg total) rectally every 6 (six) hours as needed for nausea. 02/23/18 02/23/19  Loleta Rose, MD  sucralfate (CARAFATE) 1 g tablet Take 1 tablet (1 g total) by mouth 4 (four) times daily. 01/22/18   Sharman Cheek, MD    Allergies Patient has no known allergies.  Family History  Problem Relation Age of Onset  . Alcohol abuse Mother   . Drug abuse Mother   .  CAD Mother   . Arthritis Maternal Grandmother   . Heart disease Maternal Grandmother   . Stroke Maternal Grandmother   . Hypertension Maternal Grandmother   . Kidney disease Maternal Grandmother   . Diabetes Maternal Grandmother   . Anxiety disorder Brother   . Hypertension Father     Social History Social History   Tobacco Use  . Smoking status: Never Smoker  . Smokeless tobacco: Never Used  Substance Use Topics  . Alcohol use: Not Currently  . Drug use: No    Review of Systems Constitutional: No fever/chills Eyes: No visual changes. ENT: No sore throat. Cardiovascular: Denies chest pain. Respiratory: Denies shortness of breath. Gastrointestinal: Severe and persistent nausea and vomiting for about 12 hours.  One episode of loose stool.  No abdominal pain.   Genitourinary: Negative for dysuria. Musculoskeletal: Negative for neck pain.  Negative for back pain. Integumentary:  Negative for rash. Neurological: Negative for headaches, focal weakness or numbness.   ____________________________________________   PHYSICAL EXAM:  VITAL SIGNS: ED Triage Vitals  Enc Vitals Group     BP 02/22/18 1823 (!) 143/94     Pulse Rate 02/22/18 1823 94     Resp 02/22/18 1823 20     Temp 02/22/18 1823 97.7 F (36.5 C)     Temp Source 02/22/18 1823 Oral     SpO2 02/22/18 1823 100 %     Weight 02/22/18 1824 87.5 kg (193 lb)     Height 02/22/18 1824 1.651 m (5\' 5" )     Head Circumference --      Peak Flow --      Pain Score 02/22/18 1832 7     Pain Loc --      Pain Edu? --      Excl. in GC? --     Constitutional: Alert and oriented. Well appearing and in no acute distress. Eyes: Conjunctivae are normal.  Head: Atraumatic. Nose: No congestion/rhinnorhea. Mouth/Throat: Mucous membranes are moist. Neck: No stridor.  No meningeal signs.   Cardiovascular: Normal rate, regular rhythm. Good peripheral circulation. Grossly normal heart sounds. Respiratory: Normal respiratory effort.   No retractions. Lungs CTAB. Gastrointestinal: Soft and nontender. No distention.  Well-appearing surgical scars on the abdomen consistent with recent laparotomy.  No fluctuance or induration around the wound Musculoskeletal: No lower extremity tenderness nor edema. No gross deformities of extremities. Neurologic:  Normal speech and language. No gross focal neurologic deficits are appreciated.  Skin:  Skin is warm, dry and intact. No rash noted. Psychiatric: Mood and affect are normal. Speech and behavior are normal.  ____________________________________________   LABS (all labs ordered are listed, but only abnormal results are displayed)  Labs Reviewed  LIPASE, BLOOD - Abnormal; Notable for the following components:      Result Value   Lipase 67 (*)    All other components within normal limits  COMPREHENSIVE METABOLIC PANEL - Abnormal; Notable for the following components:   CO2 18 (*)    Glucose, Bld 117 (*)    All other components within normal limits  URINALYSIS, COMPLETE (UACMP) WITH MICROSCOPIC - Abnormal; Notable for the following components:   Color, Urine STRAW (*)    APPearance CLEAR (*)    Specific Gravity, Urine 1.039 (*)    Hgb urine dipstick SMALL (*)    Ketones, ur 20 (*)    All other components within normal limits  LACTIC ACID, PLASMA - Abnormal; Notable for the following components:   Lactic Acid, Venous 2.0 (*)    All other components within normal limits  LACTIC ACID, PLASMA - Abnormal; Notable for the following components:   Lactic Acid, Venous 2.1 (*)    All other components within normal limits  CBC  POC URINE PREG, ED  I-STAT CG4 LACTIC ACID, ED  CG4 I-STAT (LACTIC ACID)  POCT PREGNANCY, URINE   ____________________________________________  EKG  No indication for EKG ____________________________________________  RADIOLOGY   ED MD interpretation: No acute abnormality including no obstruction or evidence of postsurgical infection  Official  radiology report(s): Ct Abdomen Pelvis W Contrast  Result Date: 02/23/2018 CLINICAL DATA:  47 y/o F; hernia surgery 02/02/2018. Patient complains of diarrhea this a.m. and vomiting the rest of the day. EXAM: CT ABDOMEN AND PELVIS WITH CONTRAST TECHNIQUE: Multidetector CT imaging of the abdomen and pelvis was performed using the standard protocol following bolus administration of intravenous contrast. CONTRAST:  100mL ISOVUE-300  IOPAMIDOL (ISOVUE-300) INJECTION 61% COMPARISON:  01/31/2018 CT abdomen and pelvis. FINDINGS: Lower chest: No acute abnormality. Hepatobiliary: No focal liver abnormality is seen. Status post cholecystectomy. No biliary dilatation. Pancreas: Hypodensity within the body of the spleen is better characterized on the prior study (series 7, image 9). No pancreatic inflammatory changes or pancreatic ductal dilatation. Spleen: Normal in size without focal abnormality. Adrenals/Urinary Tract: Normal adrenal glands. 18 mm right kidney interpolar cyst. No additional focal kidney lesion. No urinary stone disease. No hydronephrosis. Normal bladder. Stomach/Bowel: Status post gastric bypass. Patent anastomoses. No findings of obstruction. No inflammatory changes of the small or large bowel. Mild sigmoid diverticulosis, no findings of acute diverticulitis. Appendix not identified, no pericecal inflammation. Vascular/Lymphatic: No significant vascular findings are present. No enlarged abdominal or pelvic lymph nodes. Reproductive: Simple right adnexal cyst is mildly increased in size measuring 4.7 cm. Multiple subcentimeter uterine hypodensities, probably small myomata. Other: Small focus of fat stranding within the right upper quadrant mesentery (series 2, image 32)., edema within the midline anterior abdominal wall is new from the prior study compatible with history of recent hernia repair. No recurrent hernia identified. Musculoskeletal: No fracture is seen. Moderate levocurvature of the lumbar spine  with apex at L3. Advanced L3-4 discogenic degenerative changes and lower lumbar facet arthropathy. IMPRESSION: 1. Postsurgical changes within the midline anterior abdominal wall. No recurrent hernia or fluid collection identified. 2. Focus of fat stranding within the anterior right upper quadrant, probably interval small omental infarct or postsurgical changes. 3. Hypodensity within the body of the pancreas again noted, better characterized on the prior study. Follow-up with nonemergent MRI of the pancreas with and without contrast is recommended as per prior study. Electronically Signed   By: Mitzi Hansen M.D.   On: 02/23/2018 01:38    ____________________________________________   PROCEDURES  Critical Care performed: No   Procedure(s) performed:   Procedures   ____________________________________________   INITIAL IMPRESSION / ASSESSMENT AND PLAN / ED COURSE  As part of my medical decision making, I reviewed the following data within the electronic MEDICAL RECORD NUMBER History obtained from family, Nursing notes reviewed and incorporated, Labs reviewed , Old chart reviewed and Notes from prior ED visits    Differential diagnosis includes, but is not limited to, viral GI illness, SBO/ileus, surgical complication including abscess or infection.  The patient's CMP is within normal limits and her CBC is normal with no leukocytosis.  Lipase is slightly elevated which likely is just due to the persistent vomiting although could be indicative of an acute pancreatitis although this seems unlikely.  The patient reports that she does have a couple of drinks a week but she has not had any recently and is not a "regular drinker".  I will provide a liter bolus of IV fluids, Haldol 1 mg IV and Benadryl 12.5 mg IV for persistent and intractable vomiting, and we will obtain a CT scan with IV contrast only to eval for SBO or surgical complications.  Clinical Course as of Feb 23 733  Fri Feb 23, 2018  0101 The patient is noted to have a lactate = 2. With the current information available to me, I don't think the patient is in sepsis nor septic shock. The lactate of 2 is related to vomiting and volume depletion.  Repleting with fluids and will redraw lactic after fluid bolus.   Lactic Acid, Venous(!!): 2.0 [CF]  0135 Patient no longer vomiting but still nauseated.  Administering Reglan 10 mg IV.  Awaiting  CT results.   [CF]  0212 Patient is feeling better after the Reglan.  Repeat lactic acid is being sent.  She is continued to have no pain and only some mild residual nausea.    CT scan was reassuring with no evidence of acute abnormality, obstruction, infection, etc.  I suspect viral or foodborne illness.  She is comfortable with the plan for discharge home if she passes a p.o. challenge.  She already has Zofran at home so I am giving her prescription for Phenergan suppositories.  She already has an appointment in 3 days with Dr. Everlene Farrier.  I gave my usual and customary return precautions.   [CF]  0400 Lactic actually went up slightly.  Unclear significance except that PO has been minimal.  Still afebrile, still persistent nausea, but no more vomiting.  Giving another 1.5 L (target of 30 mL/kg) and will check another lactic.  Now giving Zofran 4 mg IV for refractory nausea.   [CF]  0550 The patient is fast asleep.  After another liter and half of IV fluids her lactic acid is come all the way down to 1.04.  I will let her sleep for a little while and then reassess, but I anticipate discharge and outpatient follow-up as originally planned.   [CF]  [phone] The patient is now awake and states that she feels "so much better".  Nausea has resolved and she got a good few hours of sleep.  She is comfortable with the plan for discharge and outpatient follow-up.  I gave my usual and customary return precautions.   [CF]    Clinical Course User Index [CF] Loleta Rose, MD     ____________________________________________  FINAL CLINICAL IMPRESSION(S) / ED DIAGNOSES  Final diagnoses:  Non-intractable vomiting with nausea, unspecified vomiting type     MEDICATIONS GIVEN DURING THIS VISIT:  Medications  haloperidol lactate (HALDOL) injection 1 mg (1 mg Intravenous Given 02/23/18 0024)  diphenhydrAMINE (BENADRYL) injection 12.5 mg (12.5 mg Intravenous Given 02/23/18 0017)  sodium chloride 0.9 % bolus 1,000 mL (0 mLs Intravenous Stopped 02/23/18 0155)  iopamidol (ISOVUE-300) 61 % injection 100 mL (100 mLs Intravenous Contrast Given 02/23/18 0107)  metoCLOPramide (REGLAN) injection 10 mg (10 mg Intravenous Given 02/23/18 0137)  sodium chloride 0.9 % bolus 1,000 mL (0 mLs Intravenous Stopped 02/23/18 0516)  sodium chloride 0.9 % bolus 500 mL (0 mLs Intravenous Stopped 02/23/18 0516)  ondansetron (ZOFRAN) injection 4 mg (4 mg Intravenous Given 02/23/18 0338)     ED Discharge Orders         Ordered    promethazine (PHENERGAN) 25 MG suppository  Every 6 hours PRN     02/23/18 0214           Note:  This document was prepared using Dragon voice recognition software and may include unintentional dictation errors.    Loleta Rose, MD 02/23/18 [phone]

## 2018-02-23 NOTE — ED Notes (Signed)
Pt able to keep Ginger Ale down. MD notified.

## 2018-02-23 NOTE — Telephone Encounter (Signed)
Patient wanted you to know she was in the hospital last night. She will see you on Monday

## 2018-02-26 ENCOUNTER — Ambulatory Visit (INDEPENDENT_AMBULATORY_CARE_PROVIDER_SITE_OTHER): Payer: Managed Care, Other (non HMO) | Admitting: Surgery

## 2018-02-26 ENCOUNTER — Other Ambulatory Visit: Payer: Self-pay

## 2018-02-26 ENCOUNTER — Encounter: Payer: Self-pay | Admitting: Surgery

## 2018-02-26 VITALS — BP 152/96 | HR 77 | Temp 97.8°F | Resp 12 | Ht 67.0 in | Wt 195.0 lb

## 2018-02-26 DIAGNOSIS — Z09 Encounter for follow-up examination after completed treatment for conditions other than malignant neoplasm: Secondary | ICD-10-CM

## 2018-02-26 NOTE — Patient Instructions (Addendum)
Return in three weeks. The patient is aware to call back for any questions or concerns.  

## 2018-02-26 NOTE — Progress Notes (Signed)
S/p Lap for internal hernia Doing much better No abd pain Taking PO Had some nausea and vomiting a few days ago , CT no acute findings   PE NAD Abd: soft, nt , incision c/d/i, she had a staple that I removed  A/p doing well No acute issues RTC 4 weeks per pt request

## 2018-03-01 ENCOUNTER — Other Ambulatory Visit: Payer: Self-pay | Admitting: Family Medicine

## 2018-03-01 NOTE — Telephone Encounter (Signed)
Please call the patient.  She needs follow-up scheduled to receive a refill of this medication.

## 2018-03-02 ENCOUNTER — Other Ambulatory Visit: Payer: Self-pay | Admitting: Family Medicine

## 2018-03-02 ENCOUNTER — Telehealth: Payer: Self-pay | Admitting: Surgery

## 2018-03-02 NOTE — Telephone Encounter (Signed)
Patient has called and would like a return to work note. Patient had a return to work note on 02/19/18, however patient states that she had a set back. She would like a return to work note with the return date of 02/27/18. This can be faxed to [phone] per patient's request. Please call patient once completed.

## 2018-03-02 NOTE — Telephone Encounter (Signed)
Return to work note faxed

## 2018-03-06 NOTE — Telephone Encounter (Signed)
LMTCB to inform patient of below & try to get patient scheduled.

## 2018-03-06 NOTE — Telephone Encounter (Signed)
LMTCB. Patient needs appointment.

## 2018-03-08 NOTE — Telephone Encounter (Signed)
Last OV 10/10/2017   Last refilled 01/19/2018 disp 60 with no refills   Sent to PCP for approval

## 2018-03-09 MED ORDER — CLONAZEPAM 0.5 MG PO TABS: 0.5000 mg | ORAL_TABLET | Freq: Two times a day (BID) | ORAL | 0 refills | Status: DC | PRN

## 2018-03-09 NOTE — Telephone Encounter (Signed)
Short-term refill sent to pharmacy.  Patient needs to schedule follow-up for further refills.

## 2018-03-09 NOTE — Telephone Encounter (Signed)
Controlled substance database reviewed.

## 2018-03-19 ENCOUNTER — Ambulatory Visit: Payer: Managed Care, Other (non HMO) | Admitting: Surgery

## 2018-03-20 MED ORDER — CLONAZEPAM 0.5 MG PO TABS: ORAL_TABLET | ORAL | 0 refills | Status: DC

## 2018-03-20 NOTE — Addendum Note (Signed)
Addended by: Birdie Sons, Adonay Scheier G on: 03/20/2018 10:01 AM   Modules accepted: Orders

## 2018-03-20 NOTE — Telephone Encounter (Addendum)
Please call the patient to get her scheduled for follow-up to discuss refills of her clonazepam. Tapering dose can be called in to pharmacy. Please see the prescription signed today for instructions and call this in to her pharmacy.  Thanks.

## 2018-04-04 ENCOUNTER — Ambulatory Visit: Payer: Managed Care, Other (non HMO) | Admitting: Surgery

## 2018-04-19 ENCOUNTER — Other Ambulatory Visit: Payer: Self-pay | Admitting: Family Medicine

## 2018-05-08 ENCOUNTER — Telehealth: Payer: Self-pay

## 2018-05-08 ENCOUNTER — Other Ambulatory Visit: Payer: Self-pay

## 2018-05-08 NOTE — Telephone Encounter (Signed)
A fax request came in for Prozac, pt needs an appt to refill this medication.  Faxed the denial to [facility] to let the pt know.  Coralee North, CMA

## 2018-05-08 NOTE — Telephone Encounter (Signed)
A fax request for Prozac was sent in from [facility], this refill was denied per the provider, pt needs an appt, called and left a message on VM to call and make that appt for this medication.  Coralee North, CMA

## 2018-05-09 ENCOUNTER — Other Ambulatory Visit: Payer: Self-pay

## 2018-06-03 ENCOUNTER — Other Ambulatory Visit: Payer: Self-pay | Admitting: Family Medicine

## 2018-06-27 ENCOUNTER — Other Ambulatory Visit: Payer: Self-pay | Admitting: Family Medicine

## 2018-06-27 ENCOUNTER — Ambulatory Visit (INDEPENDENT_AMBULATORY_CARE_PROVIDER_SITE_OTHER): Payer: Managed Care, Other (non HMO) | Admitting: Family Medicine

## 2018-06-27 ENCOUNTER — Encounter: Payer: Self-pay | Admitting: Family Medicine

## 2018-06-27 ENCOUNTER — Other Ambulatory Visit: Payer: Self-pay

## 2018-06-27 DIAGNOSIS — F329 Major depressive disorder, single episode, unspecified: Secondary | ICD-10-CM | POA: Diagnosis not present

## 2018-06-27 DIAGNOSIS — D508 Other iron deficiency anemias: Secondary | ICD-10-CM | POA: Diagnosis not present

## 2018-06-27 DIAGNOSIS — F32A Depression, unspecified: Secondary | ICD-10-CM

## 2018-06-27 DIAGNOSIS — F419 Anxiety disorder, unspecified: Secondary | ICD-10-CM | POA: Diagnosis not present

## 2018-06-27 MED ORDER — CLONAZEPAM 0.5 MG PO TABS: 0.2500 mg | ORAL_TABLET | Freq: Two times a day (BID) | ORAL | 0 refills | Status: DC | PRN

## 2018-06-27 NOTE — Progress Notes (Signed)
Virtual Visit via telephone note  This visit type was conducted due to national recommendations for restrictions regarding the COVID-19 pandemic (e.g. social distancing).  This format is felt to be most appropriate for this patient at this time.  All issues noted in this document were discussed and addressed.  No physical exam was performed (except for noted visual exam findings with Video Visits).   I connected with Anne James on 07/01/18 at  8:00 AM EDT by telephone and verified that I am speaking with the correct person using two identifiers. Location patient: home Location provider: work Persons participating in the virtual visit: patient, provider, grandchildren are in the room  I discussed the limitations, risks, security and privacy concerns of performing an evaluation and management service by video and the availability of in person appointments. I also discussed with the patient that there may be a patient responsible charge related to this service. The patient expressed understanding and agreed to proceed.  Interactive audio and video telecommunications were attempted between this provider and patient, however failed, due to patient having technical difficulties OR patient did not have access to video capability.  We continued and completed visit with audio only.   Reason for visit: follow-up  HPI: Anxiety/depression: Patient notes her anxiety is up there currently.  She had been doing fairly well and notes she went to the grocery store recently and she felt increasingly anxious at that time.  She has been working from home and that has also contributed.  She does note some depression though no SI.  She notes she has good days and bad days.  She continues on Prozac.  She takes clonazepam half a tablet at a time.  She notes she does not get drowsy with this though she takes a whole tablet she will get drowsy.  No alcohol intake with this.  She has tried Wellbutrin in the past.  Iron  deficiency: Patient has had an upper endoscopy with her prior abdominal pain admission though has not undergone a colonoscopy.  She notes no issues with bleeding or blood in her stool.   ROS: See pertinent positives and negatives per HPI.  Past Medical History:  Diagnosis Date  . Anemia    low iron  . Anxiety   . 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 Chicken pox   . Depression   . Eating disorder   . GERD (gastroesophageal reflux disease)   . History of endometrial ablation 09/17/2016   In 2006. No periods since 2006.  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 Hypertension   . Phlebitis   . Primary localized osteoarthritis of left knee   . Primary localized osteoarthritis of right knee 01/06/2016  . 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 CHOLECYSTECTOMY N/A 09/18/2016   Procedure: LAPAROSCOPIC CHOLECYSTECTOMY;  Surgeon: Leafy Ro, MD;  Location: [facility] ORS;  Service: General;  Laterality: N/A;  . EPIGASTRIC HERNIA REPAIR  02/02/2018   Procedure: INTERNAL HERNIA  REDUCTION;  Surgeon: Leafy Ro, MD;  Location: [facility] ORS;  Service: General;;  . ESOPHAGOGASTRODUODENOSCOPY (EGD) WITH PROPOFOL N/A 02/02/2018   Procedure: ESOPHAGOGASTRODUODENOSCOPY (EGD) WITH PROPOFOL;  Surgeon: Toledo, Boykin Nearing, MD;  Location: [facility];  Service: Gastroenterology;  Laterality: N/A;  . GASTRIC BYPASS  2002   [facility], Dr. Kennedy Bucker  . LAPAROSCOPY  N/A 02/02/2018   Procedure: LAPAROSCOPY DIAGNOSTIC CONVERTED TO OPEN;  Surgeon: Leafy Ro, MD;  Location: [facility] ORS;  Service: General;  Laterality: N/A;  . NOVASURE ABLATION    . TOTAL KNEE ARTHROPLASTY Left 06/29/2015   Procedure: LEFT TOTAL KNEE ARTHROPLASTY;  Surgeon: Salvatore Marvel, MD;  Location: [facility] OR;  Service: Orthopedics;  Laterality: Left;  . TOTAL KNEE ARTHROPLASTY Right 01/18/2016    Procedure: TOTAL KNEE ARTHROPLASTY;  Surgeon: Salvatore Marvel, MD;  Location: [facility] OR;  Service: Orthopedics;  Laterality: Right;  . TUBAL LIGATION      Family History  Problem Relation Age of Onset  . Alcohol abuse Mother   . Drug abuse Mother   . CAD Mother   . Arthritis Maternal Grandmother   . Heart disease Maternal Grandmother   . Stroke Maternal Grandmother   . Hypertension Maternal Grandmother   . Kidney disease Maternal Grandmother   . Diabetes Maternal Grandmother   . Anxiety disorder Brother   . Hypertension Father     SOCIAL HX: Non-smoker.   Current Outpatient Medications:  .  clonazePAM (KLONOPIN) 0.5 MG tablet, Take 0.5 tablets (0.25 mg total) by mouth 2 (two) times daily as needed for anxiety., Disp: 10 tablet, Rfl: 0 .  famotidine (PEPCID) 20 MG tablet, Take 1 tablet (20 mg total) by mouth 2 (two) times daily., Disp: 60 tablet, Rfl: 0 .  ferrous sulfate 325 (65 FE) MG EC tablet, Take 1 tablet (325 mg total) by mouth 2 (two) times daily with a meal., Disp: 60 tablet, Rfl: 3 .  FLUoxetine (PROZAC) 40 MG capsule, TAKE 1 CAPSULE BY MOUTH  DAILY, Disp: 30 capsule, Rfl: 1 .  ipratropium (ATROVENT) 0.06 % nasal spray, USE 2 SPRAY(S) IN EACH NOSTRIL 3 TO 4 TIMES DAILY AS NEEDED FOR ALLERGIES, Disp: , Rfl:  .  Multiple Vitamin (MULTIVITAMIN) capsule, Take 1 capsule by mouth daily., Disp: , Rfl:  .  ondansetron (ZOFRAN ODT) 4 MG disintegrating tablet, Take 1 tablet (4 mg total) by mouth every 8 (eight) hours as needed for nausea or vomiting., Disp: 20 tablet, Rfl: 0 .  promethazine (PHENERGAN) 25 MG suppository, Place 1 suppository (25 mg total) rectally every 6 (six) hours as needed for nausea., Disp: 12 suppository, Rfl: 1 .  sucralfate (CARAFATE) 1 g tablet, Take 1 tablet (1 g total) by mouth 4 (four) times daily., Disp: 120 tablet, Rfl: 1  EXAM: This was a telehealth telephone visit and thus no physical exam was completed.  ASSESSMENT AND PLAN:  Discussed the following  assessment and plan:  Anxiety and depression  Iron deficiency anemia secondary to inadequate dietary iron intake - Plan: Ambulatory referral to Gastroenterology  Anxiety and depression Patient with some increased anxiety.  I discussed changing her Prozac to a different medication.  We will check with our clinical pharmacist regarding a tapering regimen and then contact the patient.  Iron deficiency anemia secondary to inadequate dietary iron intake Discussed that she needs to have a colonoscopy and a referral to GI will be placed for this to be completed.    I discussed the assessment and treatment plan with the patient. The patient was provided an opportunity to ask questions and all were answered. The patient agreed with the plan and demonstrated an understanding of the instructions.   The patient was advised to call back or seek an in-person evaluation if the symptoms worsen or if the condition fails to improve as anticipated.  I provided 14 minutes of non-face-to-face time  during this encounter.   Marikay AlarEric Keshaun Dubey, MD

## 2018-06-27 NOTE — Telephone Encounter (Signed)
Last OV 10/10/2017   Last refilled 03/30/2018 disp 14 with no refills   Next OV 08/10/2018  Pt still taking this medication ?

## 2018-06-29 ENCOUNTER — Encounter: Payer: Self-pay | Admitting: Family Medicine

## 2018-06-29 NOTE — Telephone Encounter (Signed)
Sent to PCP to advise pt did a virtual visit with you on 06/27/2018

## 2018-07-01 NOTE — Assessment & Plan Note (Signed)
Discussed that she needs to have a colonoscopy and a referral to GI will be placed for this to be completed.

## 2018-07-01 NOTE — Assessment & Plan Note (Signed)
Patient with some increased anxiety.  I discussed changing her Prozac to a different medication.  We will check with our clinical pharmacist regarding a tapering regimen and then contact the patient.

## 2018-07-02 ENCOUNTER — Telehealth: Payer: Self-pay | Admitting: Family Medicine

## 2018-07-02 MED ORDER — ESCITALOPRAM OXALATE 10 MG PO TABS: 10.0000 mg | ORAL_TABLET | Freq: Every day | ORAL | 3 refills | Status: DC

## 2018-07-02 NOTE — Telephone Encounter (Signed)
Please let the patient know I heard back from the pharmacist. I would like for the patient to stop the fluoxetine for 1 week and then after one week of being off the fluoxetine she will start on the lexapro. I will send lexapro to her pharmacy.

## 2018-07-02 NOTE — Telephone Encounter (Signed)
-----   Message from Lourena Simmonds, [facility] sent at 07/02/2018  7:59 AM EDT ----- Hey  Yes, I think stopping fluoxetine 40 x 1 week then starting escitalopram 10 mg is good!  Catie ----- Message ----- From: Glori Luis, MD Sent: 07/01/2018   8:25 AM EDT To: Lourena Simmonds, [facility]  Hey Catie,   This patient is on prozac and I would like to changed her to lexapro. Would it be best to stop the prozac for a week and then start her on lexapro at that time? Thanks.   Minerva Areola

## 2018-07-02 NOTE — Telephone Encounter (Signed)
Called and spoke with pt. Pt advised and voiced understanding.  

## 2018-08-10 ENCOUNTER — Other Ambulatory Visit: Payer: Self-pay

## 2018-08-10 ENCOUNTER — Ambulatory Visit (INDEPENDENT_AMBULATORY_CARE_PROVIDER_SITE_OTHER): Payer: Managed Care, Other (non HMO) | Admitting: Family Medicine

## 2018-08-10 ENCOUNTER — Encounter: Payer: Self-pay | Admitting: Family Medicine

## 2018-08-10 DIAGNOSIS — F329 Major depressive disorder, single episode, unspecified: Secondary | ICD-10-CM

## 2018-08-10 DIAGNOSIS — D508 Other iron deficiency anemias: Secondary | ICD-10-CM | POA: Diagnosis not present

## 2018-08-10 DIAGNOSIS — E669 Obesity, unspecified: Secondary | ICD-10-CM | POA: Diagnosis not present

## 2018-08-10 DIAGNOSIS — F419 Anxiety disorder, unspecified: Secondary | ICD-10-CM

## 2018-08-10 MED ORDER — ESCITALOPRAM OXALATE 20 MG PO TABS: 20.0000 mg | ORAL_TABLET | Freq: Every day | ORAL | 1 refills | Status: DC

## 2018-08-10 NOTE — Assessment & Plan Note (Signed)
Symptoms have improved to a certain degree on the Lexapro.  We will increase the dose.  She will monitor her symptoms.  Follow-up in 3 months.

## 2018-08-10 NOTE — Progress Notes (Signed)
Virtual Visit via video Note  This visit type was conducted due to national recommendations for restrictions regarding the COVID-19 pandemic (e.g. social distancing).  This format is felt to be most appropriate for this patient at this time.  All issues noted in this document were discussed and addressed.  No physical exam was performed (except for noted visual exam findings with Video Visits).   I connected with Anne James today at  2:15 PM EDT by a video enabled telemedicine application and verified that I am speaking with the correct person using two identifiers. Location patient: home Location provider: work Persons participating in the virtual visit: patient, provider  I discussed the limitations, risks, security and privacy concerns of performing an evaluation and management service by telephone and the availability of in person appointments. I also discussed with the patient that there may be a patient responsible charge related to this service. The patient expressed understanding and agreed to proceed.  Reason for visit: follow-up  HPI: Obesity: Patient notes she has gained 15 pounds since being out of work starting in April.  She has been less active and has been snacking more.  She has not been getting any significant exercise.  She has been eating more popcorn and snack foods.  She did start on Lexapro around that time as well.  Anxiety/depression: Patient notes these things have improved to a certain degree.  She can definitely tell a difference on the Lexapro compared to other medication that she has tried.  She takes the clonazepam as needed.  She notes no drowsiness or alcohol intake.  No SI.  Iron deficiency: She was scheduled with GI for colonoscopy though it appears she canceled her procedure the day of the procedure.  She has the contact information to call them to get this rescheduled.  She notes no blood in her stool.  She has had an EGD.   ROS: See pertinent positives  and negatives per HPI.  Past Medical History:  Diagnosis Date  . Anemia    low iron  . Anxiety   . 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 Chicken pox   . Depression   . Eating disorder   . GERD (gastroesophageal reflux disease)   . History of endometrial ablation 09/17/2016   In 2006. No periods since 2006.  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 Hypertension   . Phlebitis   . Primary localized osteoarthritis of left knee   . Primary localized osteoarthritis of right knee 01/06/2016  . 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 CHOLECYSTECTOMY N/A 09/18/2016   Procedure: LAPAROSCOPIC CHOLECYSTECTOMY;  Surgeon: Leafy Ro, MD;  Location: [facility] ORS;  Service: General;  Laterality: N/A;  . EPIGASTRIC HERNIA REPAIR  02/02/2018   Procedure: INTERNAL HERNIA  REDUCTION;  Surgeon: Leafy Ro, MD;  Location: [facility] ORS;  Service: General;;  . ESOPHAGOGASTRODUODENOSCOPY (EGD) WITH PROPOFOL N/A 02/02/2018   Procedure: ESOPHAGOGASTRODUODENOSCOPY (EGD) WITH PROPOFOL;  Surgeon: Toledo, Boykin Nearing, MD;  Location: [facility];  Service: Gastroenterology;  Laterality: N/A;  . GASTRIC BYPASS  2002   [facility], Dr. Kennedy Bucker  . LAPAROSCOPY N/A 02/02/2018   Procedure: LAPAROSCOPY DIAGNOSTIC CONVERTED TO OPEN;  Surgeon: Leafy Ro, MD;  Location: [facility] ORS;  Service: General;  Laterality: N/A;  . NOVASURE ABLATION    .  TOTAL KNEE ARTHROPLASTY Left 06/29/2015   Procedure: LEFT TOTAL KNEE ARTHROPLASTY;  Surgeon: Salvatore Marvel, MD;  Location: [facility] OR;  Service: Orthopedics;  Laterality: Left;  . TOTAL KNEE ARTHROPLASTY Right 01/18/2016   Procedure: TOTAL KNEE ARTHROPLASTY;  Surgeon: Salvatore Marvel, MD;  Location: [facility] OR;  Service: Orthopedics;  Laterality: Right;  . TUBAL LIGATION      Family History  Problem Relation Age of Onset  . Alcohol abuse  Mother   . Drug abuse Mother   . CAD Mother   . Arthritis Maternal Grandmother   . Heart disease Maternal Grandmother   . Stroke Maternal Grandmother   . Hypertension Maternal Grandmother   . Kidney disease Maternal Grandmother   . Diabetes Maternal Grandmother   . Anxiety disorder Brother   . Hypertension Father     SOCIAL HX: Non-smoker.   Current Outpatient Medications:  .  clonazePAM (KLONOPIN) 0.5 MG tablet, Take 0.5 tablets (0.25 mg total) by mouth 2 (two) times daily as needed for anxiety., Disp: 10 tablet, Rfl: 0 .  escitalopram (LEXAPRO) 20 MG tablet, Take 1 tablet (20 mg total) by mouth daily., Disp: 90 tablet, Rfl: 1 .  famotidine (PEPCID) 20 MG tablet, Take 1 tablet (20 mg total) by mouth 2 (two) times daily., Disp: 60 tablet, Rfl: 0 .  ferrous sulfate 325 (65 FE) MG EC tablet, Take 1 tablet (325 mg total) by mouth 2 (two) times daily with a meal., Disp: 60 tablet, Rfl: 3 .  ipratropium (ATROVENT) 0.06 % nasal spray, USE 2 SPRAY(S) IN EACH NOSTRIL 3 TO 4 TIMES DAILY AS NEEDED FOR ALLERGIES, Disp: , Rfl:  .  Multiple Vitamin (MULTIVITAMIN) capsule, Take 1 capsule by mouth daily., Disp: , Rfl:  .  ondansetron (ZOFRAN ODT) 4 MG disintegrating tablet, Take 1 tablet (4 mg total) by mouth every 8 (eight) hours as needed for nausea or vomiting. (Patient not taking: Reported on 08/10/2018), Disp: 20 tablet, Rfl: 0 .  promethazine (PHENERGAN) 25 MG suppository, Place 1 suppository (25 mg total) rectally every 6 (six) hours as needed for nausea. (Patient not taking: Reported on 08/10/2018), Disp: 12 suppository, Rfl: 1 .  sucralfate (CARAFATE) 1 g tablet, Take 1 tablet (1 g total) by mouth 4 (four) times daily. (Patient not taking: Reported on 08/10/2018), Disp: 120 tablet, Rfl: 1  EXAM:  VITALS per patient if applicable: None.  GENERAL: alert, oriented, appears well and in no acute distress  HEENT: atraumatic, conjunttiva clear, no obvious abnormalities on inspection of external nose  and ears  NECK: normal movements of the head and neck  LUNGS: on inspection no signs of respiratory distress, breathing rate appears normal, no obvious gross SOB, gasping or wheezing  CV: no obvious cyanosis  MS: moves all visible extremities without noticeable abnormality  PSYCH/NEURO: pleasant and cooperative, no obvious depression or anxiety, speech and thought processing grossly intact  ASSESSMENT AND PLAN:  Discussed the following assessment and plan:  Anxiety and depression  Iron deficiency anemia secondary to inadequate dietary iron intake  Obesity (BMI 30-39.9)  Anxiety and depression Symptoms have improved to a certain degree on the Lexapro.  We will increase the dose.  She will monitor her symptoms.  Follow-up in 3 months.  Iron deficiency anemia secondary to inadequate dietary iron intake I discussed that she needs to follow-up with GI and get a colonoscopy scheduled.  I discussed the reasoning behind this procedure and that it is to help rule out any underlying lesion that could be contributing  to her iron deficiency.  Most likely her iron deficiency is related to inadequate absorption though we do need to evaluate with colonoscopy to rule out other causes.  The patient noted she would contact GI to schedule this.  Obesity (BMI 30-39.9) Weight has trended up.  I suspect this has to do with inactivity and worsened dietary intake.  Lexapro could be contributing though that has been beneficial for her depression and anxiety.  We will have her work on exercise and dietary changes for this.  If weight trends up further despite these changes we could consider changing Lexapro to an alternative medication.  Front office staff will contact the patient to get her scheduled for labs and follow-up.  Social distancing precautions and sick precautions given regarding COVID-19.   I discussed the assessment and treatment plan with the patient. The patient was provided an opportunity  to ask questions and all were answered. The patient agreed with the plan and demonstrated an understanding of the instructions.   The patient was advised to call back or seek an in-person evaluation if the symptoms worsen or if the condition fails to improve as anticipated.   Marikay AlarEric Houa Nie, MD

## 2018-08-10 NOTE — Assessment & Plan Note (Signed)
I discussed that she needs to follow-up with GI and get a colonoscopy scheduled.  I discussed the reasoning behind this procedure and that it is to help rule out any underlying lesion that could be contributing to her iron deficiency.  Most likely her iron deficiency is related to inadequate absorption though we do need to evaluate with colonoscopy to rule out other causes.  The patient noted she would contact GI to schedule this.

## 2018-08-10 NOTE — Assessment & Plan Note (Signed)
Weight has trended up.  I suspect this has to do with inactivity and worsened dietary intake.  Lexapro could be contributing though that has been beneficial for her depression and anxiety.  We will have her work on exercise and dietary changes for this.  If weight trends up further despite these changes we could consider changing Lexapro to an alternative medication.

## 2018-10-17 ENCOUNTER — Other Ambulatory Visit: Payer: Self-pay | Admitting: Family Medicine

## 2018-10-18 NOTE — Telephone Encounter (Signed)
Refilled: 06/27/2018 Last OV: 08/10/2018 Next OV: not scheduled

## 2018-10-19 MED ORDER — CLONAZEPAM 0.5 MG PO TABS: 0.2500 mg | ORAL_TABLET | Freq: Two times a day (BID) | ORAL | 0 refills | Status: DC | PRN

## 2018-10-19 NOTE — Telephone Encounter (Signed)
Sent to pharmacy.  Patient needs to be scheduled for follow-up and for lab work.  Orders for labs were placed after her last visit though it does not appear that she was scheduled.  Please get her scheduled or forward to Rasheedah to get her scheduled.  Thanks.

## 2019-02-07 IMAGING — CT CT ABD-PELV W/ CM
2 of 5 series · 15 of 46 positions shown, 17 images · IV contrast (APPLIED)
Comparison: 01/31/2018 CT abdomen and pelvis.

CLINICAL DATA: 47 y/o F; hernia surgery 02/02/2018. Patient
complains of diarrhea this a.m. and vomiting the rest of the day.

EXAM:
CT ABDOMEN AND PELVIS WITH CONTRAST
TECHNIQUE: Multidetector CT imaging of the abdomen and pelvis was performed
using the standard protocol following bolus administration of
intravenous contrast.
CONTRAST:  100mL TBV02E-ICC IOPAMIDOL (TBV02E-ICC) INJECTION 61%

[Series 2: axial st · axial · 0.86mm/px · z∈[-842,-422]mm · 12 of 94 slices shown, 14 images]
[im 5/94  soft-tissue]
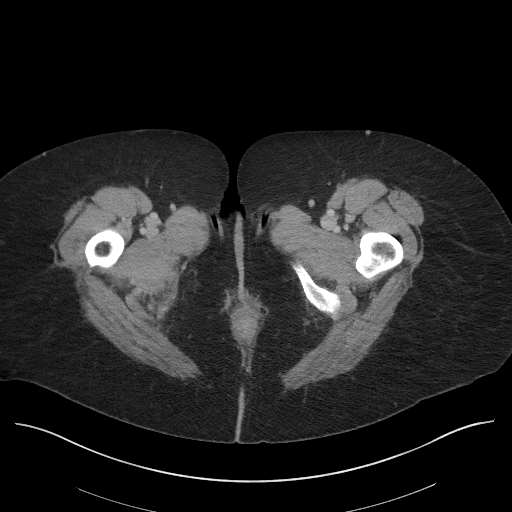
[im 5/94  bone]
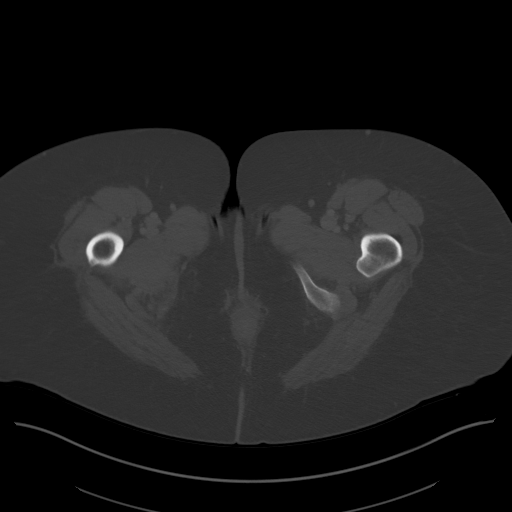
[im 15/94  soft-tissue]
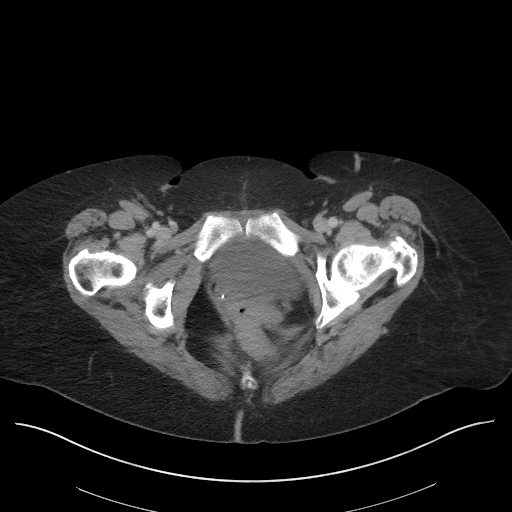
[im 20/94  soft-tissue]
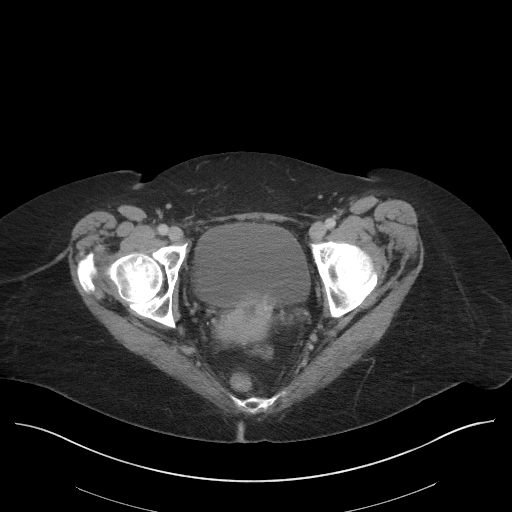
[im 30/94  soft-tissue]
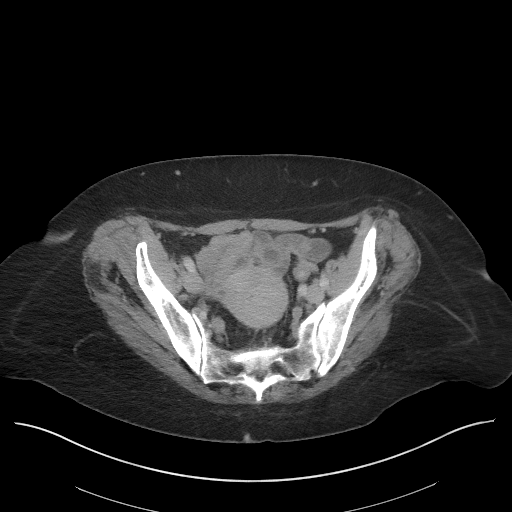
[im 35/94  soft-tissue]
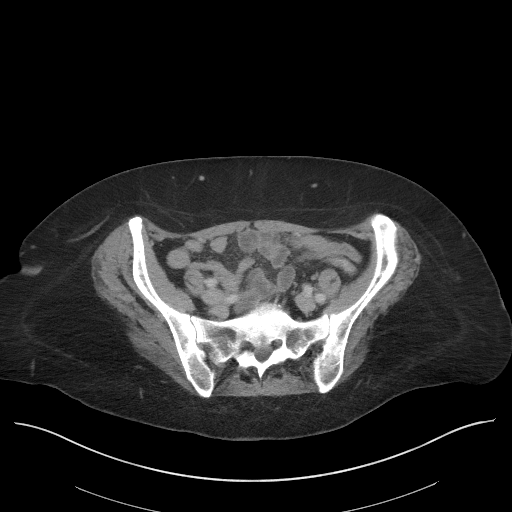
[im 45/94  soft-tissue]
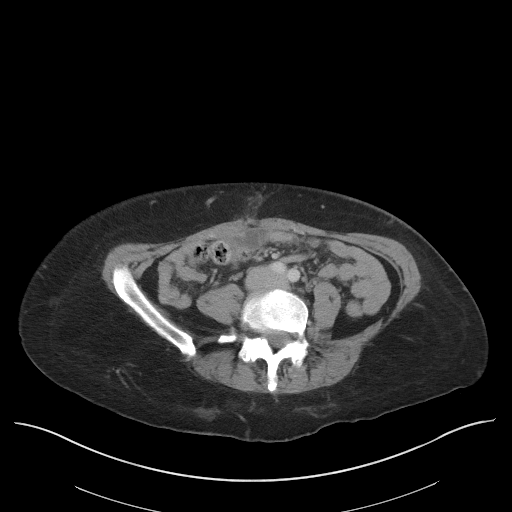
[im 49/94  soft-tissue]
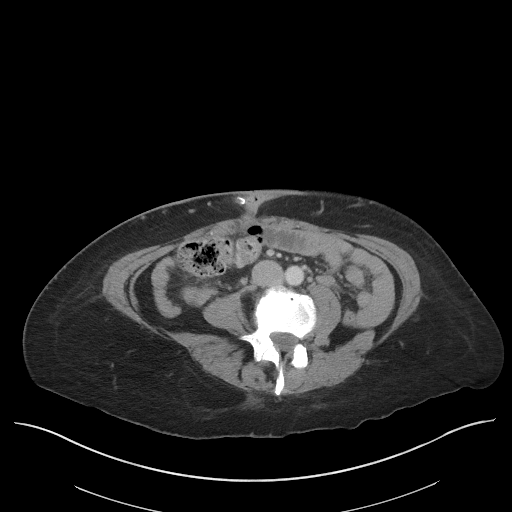
[im 59/94  soft-tissue]
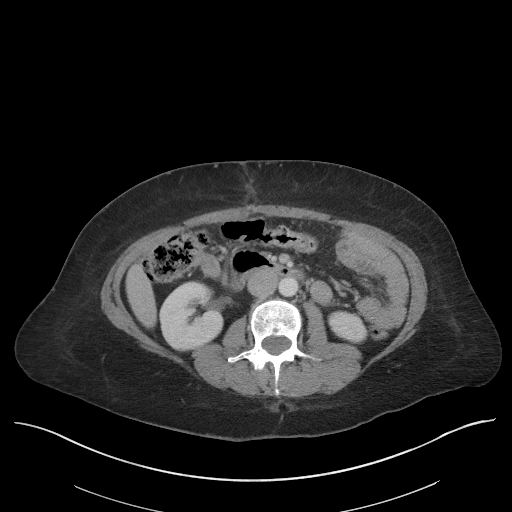
[im 64/94  soft-tissue]
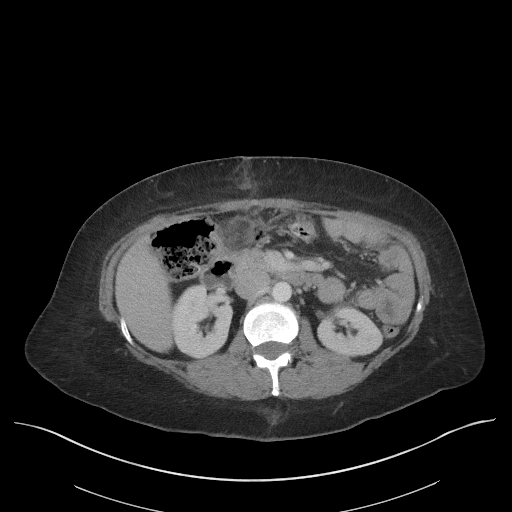
[im 64/94  bone]
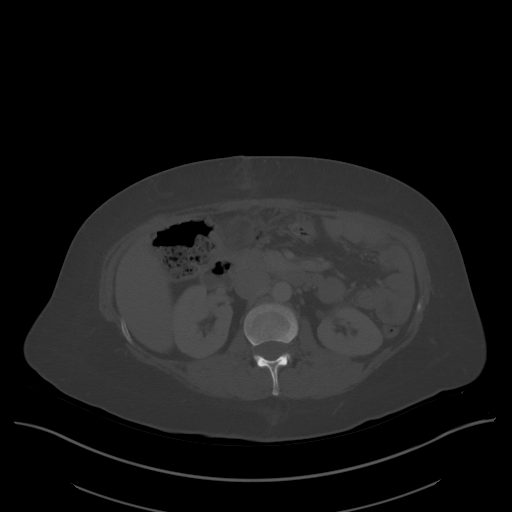
[im 74/94  soft-tissue]
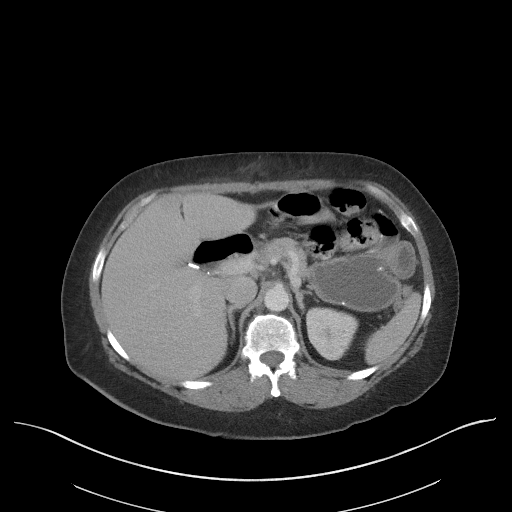
[im 79/94  soft-tissue]
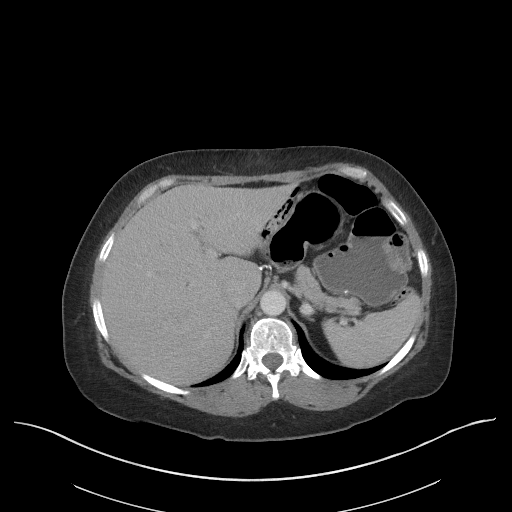
[im 89/94  soft-tissue]
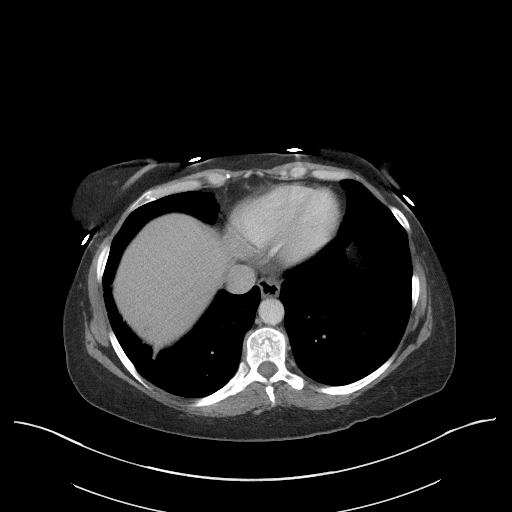

[Series 5: coronal st · coronal · 0.67mm/px · 3 of 70 slices shown]
[im 24/70  soft-tissue]
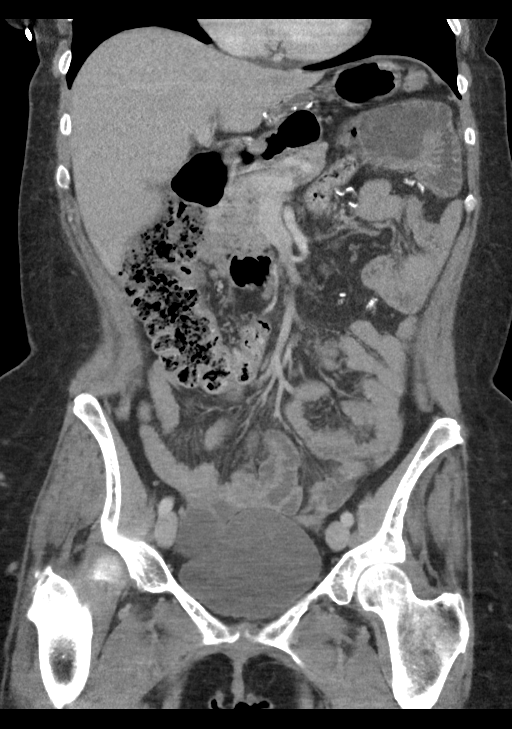
[im 31/70  soft-tissue]
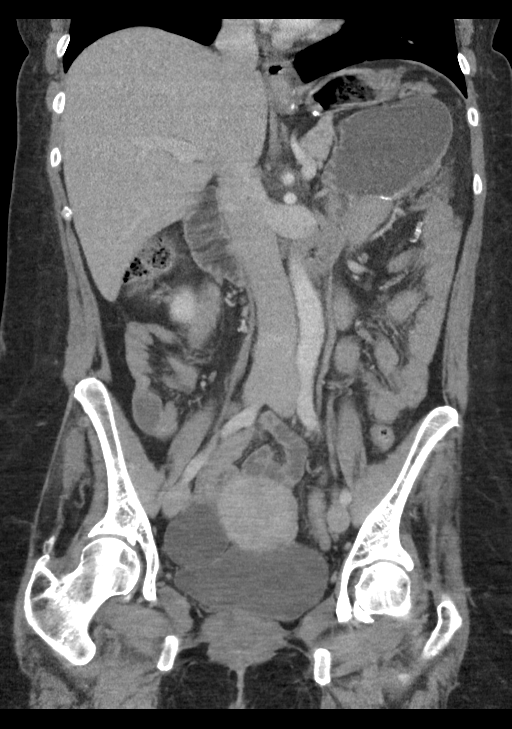
[im 39/70  soft-tissue]
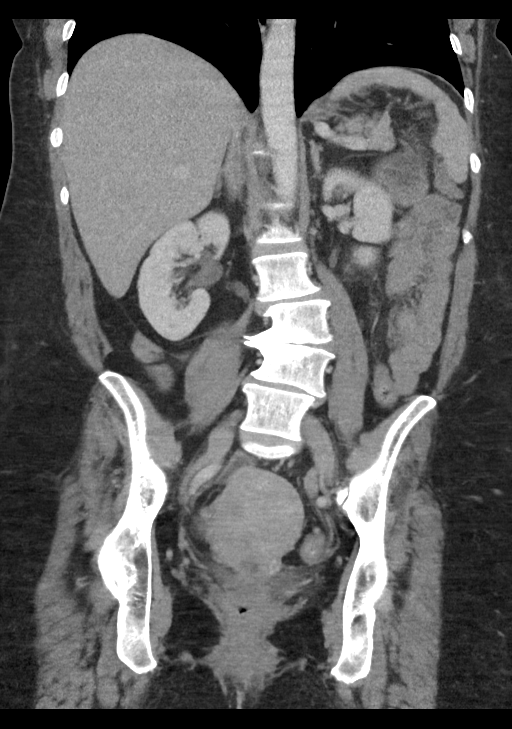

[15 of 46 positions shown; findings below may reference images not displayed]

FINDINGS: Lower chest: No acute abnormality.

Hepatobiliary: No focal liver abnormality is seen. Status post
cholecystectomy. No biliary dilatation.

Pancreas: Hypodensity within the body of the spleen is better
characterized on the prior study (series 7, image 9). No pancreatic
inflammatory changes or pancreatic ductal dilatation.

Spleen: Normal in size without focal abnormality.

Adrenals/Urinary Tract: Normal adrenal glands. 18 mm right kidney
interpolar cyst. No additional focal kidney lesion. No urinary stone
disease. No hydronephrosis. Normal bladder.

Stomach/Bowel: Status post gastric bypass. Patent anastomoses. No
findings of obstruction. No inflammatory changes of the small or
large bowel. Mild sigmoid diverticulosis, no findings of acute
diverticulitis. Appendix not identified, no pericecal inflammation.

Vascular/Lymphatic: No significant vascular findings are present. No
enlarged abdominal or pelvic lymph nodes.

Reproductive: Simple right adnexal cyst is mildly increased in size
measuring 4.7 cm. Multiple subcentimeter uterine hypodensities,
probably small myomata.

Other: Small focus of fat stranding within the right upper quadrant
mesentery (series 2, image 32)., edema within the midline anterior
abdominal wall is new from the prior study compatible with history
of recent hernia repair. No recurrent hernia identified.

Musculoskeletal: No fracture is seen. Moderate levocurvature of the
lumbar spine with apex at L3. Advanced L3-4 discogenic degenerative
changes and lower lumbar facet arthropathy.
IMPRESSION: 1. Postsurgical changes within the midline anterior abdominal wall.
No recurrent hernia or fluid collection identified.
2. Focus of fat stranding within the anterior right upper quadrant,
probably interval small omental infarct or postsurgical changes.
3. Hypodensity within the body of the pancreas again noted, better
characterized on the prior study. Follow-up with nonemergent MRI of
the pancreas with and without contrast is recommended as per prior
study.

## 2019-05-16 ENCOUNTER — Encounter: Payer: Self-pay | Admitting: Family Medicine

## 2019-05-16 ENCOUNTER — Other Ambulatory Visit: Payer: Self-pay

## 2019-05-16 MED ORDER — ESCITALOPRAM OXALATE 20 MG PO TABS: 20.0000 mg | ORAL_TABLET | Freq: Every day | ORAL | 0 refills | Status: DC

## 2019-05-16 NOTE — Telephone Encounter (Signed)
I spoke with patient & have refilled citalopram until patient is able to discuss meds at her appointment on Tuesday @ 8 with Dr. Birdie Sons.

## 2019-05-21 ENCOUNTER — Encounter: Payer: Self-pay | Admitting: Family Medicine

## 2019-05-21 ENCOUNTER — Other Ambulatory Visit: Payer: Self-pay

## 2019-05-21 ENCOUNTER — Ambulatory Visit (INDEPENDENT_AMBULATORY_CARE_PROVIDER_SITE_OTHER): Payer: Managed Care, Other (non HMO) | Admitting: Family Medicine

## 2019-05-21 VITALS — BP 120/80 | HR 72 | Temp 97.6°F | Ht 65.0 in | Wt 219.8 lb

## 2019-05-21 DIAGNOSIS — F329 Major depressive disorder, single episode, unspecified: Secondary | ICD-10-CM | POA: Diagnosis not present

## 2019-05-21 DIAGNOSIS — Z1231 Encounter for screening mammogram for malignant neoplasm of breast: Secondary | ICD-10-CM

## 2019-05-21 DIAGNOSIS — F419 Anxiety disorder, unspecified: Secondary | ICD-10-CM

## 2019-05-21 DIAGNOSIS — M25512 Pain in left shoulder: Secondary | ICD-10-CM

## 2019-05-21 DIAGNOSIS — M25511 Pain in right shoulder: Secondary | ICD-10-CM

## 2019-05-21 DIAGNOSIS — D508 Other iron deficiency anemias: Secondary | ICD-10-CM | POA: Diagnosis not present

## 2019-05-21 DIAGNOSIS — E669 Obesity, unspecified: Secondary | ICD-10-CM | POA: Diagnosis not present

## 2019-05-21 DIAGNOSIS — Z1211 Encounter for screening for malignant neoplasm of colon: Secondary | ICD-10-CM

## 2019-05-21 DIAGNOSIS — F32A Depression, unspecified: Secondary | ICD-10-CM

## 2019-05-21 MED ORDER — BUPROPION HCL ER (XL) 150 MG PO TB24: 150.0000 mg | ORAL_TABLET | Freq: Every day | ORAL | 1 refills | Status: DC

## 2019-05-21 NOTE — Assessment & Plan Note (Signed)
Suspect a muscular issue or rotator cuff tendinitis.  We will have her complete exercises to start with.  Consider referral for physical therapy or orthopedics if not improving.

## 2019-05-21 NOTE — Patient Instructions (Signed)
Nice to see you. We will check lab work today and contact you with the results. We will start on Wellbutrin. Please work on diet and exercise as we discussed. You can do the exercises for your shoulders.  If not improving over the next month please let us know.  Diet Recommendations  Starchy (carb) foods: Bread, rice, pasta, potatoes, corn, cereal, grits, crackers, bagels, muffins, all baked goods.  (Fruits, milk, and yogurt also have carbohydrate, but most of these foods will not spike your blood sugar as the starchy foods will.)  A few fruits do cause high blood sugars; use small portions of bananas (limit to 1/2 at a time), grapes, watermelon, oranges, and most tropical fruits.    Protein foods: Meat, fish, poultry, eggs, dairy foods, and beans such as pinto and kidney beans (beans also provide carbohydrate).   1. Eat at least 3 meals and 1-2 snacks per day. Never go more than 4-5 hours while awake without eating. Eat breakfast within the first hour of getting up.   2. Limit starchy foods to TWO per meal and ONE per snack. ONE portion of a starchy  food is equal to the following:   - ONE slice of bread (or its equivalent, such as half of a hamburger bun).   - 1/2 cup of a "scoopable" starchy food such as potatoes or rice.   - 15 grams of carbohydrate as shown on food label.  3. Include at every meal: a protein food, a carb food, and vegetables and/or fruit.   - Obtain twice the volume of veg's as protein or carbohydrate foods for both lunch and dinner.   - Fresh or frozen veg's are best.   - Keep frozen veg's on hand for a quick vegetable serving.     Shoulder Impingement Syndrome Rehab Ask your health care provider which exercises are safe for you. Do exercises exactly as told by your health care provider and adjust them as directed. It is normal to feel mild stretching, pulling, tightness, or discomfort as you do these exercises. Stop right away if you feel sudden pain or your pain gets  worse. Do not begin these exercises until told by your health care provider. Stretching and range-of-motion exercise This exercise warms up your muscles and joints and improves the movement and flexibility of your shoulder. This exercise also helps to relieve pain and stiffness. Passive horizontal adduction In passive adduction, you use your other hand to move the injured arm toward your body. The injured arm does not move on its own. In this movement, your arm is moved across your body in the horizontal plane (horizontal adduction). 1. Sit or stand and pull your left / right elbow across your chest, toward your other shoulder. Stop when you feel a gentle stretch in the back of your shoulder and upper arm. ? Keep your arm at shoulder height. ? Keep your arm as close to your body as you comfortably can. 2. Hold for __________ seconds. 3. Slowly return to the starting position. Repeat __________ times. Complete this exercise __________ times a day. Strengthening exercises These exercises build strength and endurance in your shoulder. Endurance is the ability to use your muscles for a long time, even after they get tired. External rotation, isometric This is an exercise in which you press the back of your wrist against a door frame without moving your shoulder joint (isometric). 1. Stand or sit in a doorway, facing the door frame. 2. Bend your left / right  elbow and place the back of your wrist against the door frame. Only the back of your wrist should be touching the frame. Keep your upper arm at your side. 3. Gently press your wrist against the door frame, as if you are trying to push your arm away from your abdomen (external rotation). Press as hard as you are able without pain. ? Avoid shrugging your shoulder while you press your wrist against the door frame. Keep your shoulder blade tucked down toward the middle of your back. 4. Hold for __________ seconds. 5. Slowly release the tension, and  relax your muscles completely before you repeat the exercise. Repeat __________ times. Complete this exercise __________ times a day. Internal rotation, isometric This is an exercise in which you press your palm against a door frame without moving your shoulder joint (isometric). 1. Stand or sit in a doorway, facing the door frame. 2. Bend your left / right elbow and place the palm of your hand against the door frame. Only your palm should be touching the frame. Keep your upper arm at your side. 3. Gently press your hand against the door frame, as if you are trying to push your arm toward your abdomen (internal rotation). Press as hard as you are able without pain. ? Avoid shrugging your shoulder while you press your hand against the door frame. Keep your shoulder blade tucked down toward the middle of your back. 4. Hold for __________ seconds. 5. Slowly release the tension, and relax your muscles completely before you repeat the exercise. Repeat __________ times. Complete this exercise __________ times a day. Scapular protraction, supine  1. Lie on your back on a firm surface (supine position). Hold a __________ weight in your left / right hand. 2. Raise your left / right arm straight into the air so your hand is directly above your shoulder joint. 3. Push the weight into the air so your shoulder (scapula) lifts off the surface that you are lying on. The scapula will push up or forward (protraction). Do not move your head, neck, or back. 4. Hold for __________ seconds. 5. Slowly return to the starting position. Let your muscles relax completely before you repeat this exercise. Repeat __________ times. Complete this exercise __________ times a day. Scapular retraction  1. Sit in a stable chair without armrests, or stand up. 2. Secure an exercise band to a stable object in front of you so the band is at shoulder height. 3. Hold one end of the exercise band in each hand. Your palms should face  down. 4. Squeeze your shoulder blades together (retraction) and move your elbows slightly behind you. Do not shrug your shoulders upward while you do this. 5. Hold for __________ seconds. 6. Slowly return to the starting position. Repeat __________ times. Complete this exercise __________ times a day. Shoulder extension  1. Sit in a stable chair without armrests, or stand up. 2. Secure an exercise band to a stable object in front of you so the band is above shoulder height. 3. Hold one end of the exercise band in each hand. 4. Straighten your elbows and lift your hands up to shoulder height. 5. Squeeze your shoulder blades together and pull your hands down to the sides of your thighs (extension). Stop when your hands are straight down by your sides. Do not let your hands go behind your body. 6. Hold for __________ seconds. 7. Slowly return to the starting position. Repeat __________ times. Complete this exercise __________ times a  day. This information is not intended to replace advice given to you by your health care provider. Make sure you discuss any questions you have with your health care provider. Document Revised: 06/22/2018 Document Reviewed: 03/26/2018 Elsevier Patient Education  2020 [employer].

## 2019-05-21 NOTE — Assessment & Plan Note (Signed)
I suspect this is related to her bariatric surgery history though she has not completed evaluation for other causes.  Discussed that she needs to have a colonoscopy completed.  Referral to be placed.

## 2019-05-21 NOTE — Progress Notes (Signed)
Tommi Rumps, MD Phone: [phone]  Anne James is a 49 y.o. female who presents today for f/u.  Depression/anxiety: Patient notes she was off the Lexapro for about a week and can tell a difference.  She restarted on the Lexapro and notes it made her feel a little funky the first day though she took it on an empty stomach and is taking it with food since then and its been helpful.  She has not taken the Klonopin in a long time.  She notes it has been rough at work and she has had to take on extra jobs.  She has also had some family and friend issues that have been stressful.  No SI.  Obesity: Patient has noted some weight gain.  She notes her eating is terrible.  She has gotten in the habit of getting up from her desk and going to get food at home.  No sweets.  She is cutting down on diet Coke.  No exercise.  Iron deficiency anemia: She is no longer taking iron.  No blood in her stool.  She has not had a colonoscopy yet.  Bilateral shoulder pain: Patient notes this has been going on for some time.  She feels as though it may be related to the spurs in her neck though the pain does not radiate past her shoulders.  She notes picking things up a certain way will hurt her shoulders.  Social History   Tobacco Use  Smoking Status Never Smoker  Smokeless Tobacco Never Used     ROS see history of present illness  Objective  Physical Exam Vitals:   05/21/19 0809  BP: 120/80  Pulse: 72  Temp: 97.6 F (36.4 C)  SpO2: 99%    BP Readings from Last 3 Encounters:  05/21/19 120/80  02/26/18 (!) 152/96  02/23/18 (!) 158/88   Wt Readings from Last 3 Encounters:  05/21/19 219 lb 12.8 oz (99.7 kg)  02/26/18 195 lb (88.5 kg)  02/22/18 193 lb (87.5 kg)    Physical Exam Constitutional:      General: She is not in acute distress.    Appearance: She is not diaphoretic.  Cardiovascular:     Rate and Rhythm: Normal rate and regular rhythm.     Heart sounds: Normal heart sounds.    Pulmonary:     Effort: Pulmonary effort is normal.     Breath sounds: Normal breath sounds.  Musculoskeletal:     Comments: Bilateral shoulders with full active and passive range of motion, slight discomfort on active internal range of motion though otherwise no pain on range of motion, there is discomfort on empty can and speeds testing  Skin:    General: Skin is warm and dry.  Neurological:     Mental Status: She is alert.      Assessment/Plan: Please see individual problem list.  Anxiety and depression Worsened with lots of work issues and recent life events.  She will continue Lexapro.  We will add on Wellbutrin.  Follow-up in 8 weeks.  Bilateral shoulder pain Suspect a muscular issue or rotator cuff tendinitis.  We will have her complete exercises to start with.  Consider referral for physical therapy or orthopedics if not improving.  Obesity (BMI 30-39.9) Encouraged adding in some activity.  Discussed dietary changes.  Also advised that getting her depression under better control may help with her eating habits.  Iron deficiency anemia secondary to inadequate dietary iron intake I suspect this is related to her  bariatric surgery history though she has not completed evaluation for other causes.  Discussed that she needs to have a colonoscopy completed.  Referral to be placed.   Health Maintenance: Patient is due for mammogram.  Order placed.  She will call to schedule.  Plan for Pap smear at her next visit.  Orders Placed This Encounter  Procedures  . MM 3D SCREEN BREAST BILATERAL    Standing Status:   Future    Standing Expiration Date:   07/20/2020    Order Specific Question:   Reason for Exam (SYMPTOM  OR DIAGNOSIS REQUIRED)    Answer:   breast cancer screening    Order Specific Question:   Is the patient pregnant?    Answer:   No    Order Specific Question:   Preferred imaging location?    Answer:   [facility]  . TSH  . CBC  . Comp Met (CMET)  . Lipid panel   . HgB A1c  . Ferritin  . Iron  . Iron Binding Cap (TIBC)([facility])  . Ambulatory referral to Gastroenterology    Referral Priority:   Routine    Referral Type:   Consultation    Referral Reason:   Specialty Services Required    Number of Visits Requested:   1    Meds ordered this encounter  Medications  . buPROPion (WELLBUTRIN XL) 150 MG 24 hr tablet    Sig: Take 1 tablet (150 mg total) by mouth daily.    Dispense:  90 tablet    Refill:  1    This visit occurred during the SARS-CoV-2 public health emergency.  Safety protocols were in place, including screening questions prior to the visit, additional usage of staff PPE, and extensive cleaning of exam room while observing appropriate contact time as indicated for disinfecting solutions.    Tommi Rumps, MD [place]

## 2019-05-21 NOTE — Assessment & Plan Note (Signed)
Encouraged adding in some activity.  Discussed dietary changes.  Also advised that getting her depression under better control may help with her eating habits.

## 2019-05-21 NOTE — Assessment & Plan Note (Signed)
Worsened with lots of work issues and recent life events.  She will continue Lexapro.  We will add on Wellbutrin.  Follow-up in 8 weeks.

## 2019-05-22 LAB — COMPREHENSIVE METABOLIC PANEL
ALT: 17 IU/L (ref 0–32)
AST: 24 IU/L (ref 0–40)
Albumin/Globulin Ratio: 1.6 (ref 1.2–2.2)
Albumin: 4.1 g/dL (ref 3.8–4.8)
Alkaline Phosphatase: 73 IU/L (ref 39–117)
BUN/Creatinine Ratio: 17 (ref 9–23)
BUN: 12 mg/dL (ref 6–24)
Bilirubin Total: 0.3 mg/dL (ref 0.0–1.2)
CO2: 23 mmol/L (ref 20–29)
Calcium: 9 mg/dL (ref 8.7–10.2)
Chloride: 102 mmol/L (ref 96–106)
Creatinine, Ser: 0.69 mg/dL (ref 0.57–1.00)
GFR calc Af Amer: 117 mL/min/{1.73_m2} (ref >59)
GFR calc non Af Amer: 102 mL/min/{1.73_m2} (ref >59)
Globulin, Total: 2.6 g/dL (ref 1.5–4.5)
Glucose: 86 mg/dL (ref 65–99)
Potassium: 5.4 mmol/L — ABNORMAL HIGH (ref 3.5–5.2)
Sodium: 138 mmol/L (ref 134–144)
Total Protein: 6.7 g/dL (ref 6.0–8.5)

## 2019-05-22 LAB — CBC
Hematocrit: 37.2 % (ref 34.0–46.6)
Hemoglobin: 12.4 g/dL (ref 11.1–15.9)
MCH: 27.5 pg (ref 26.6–33.0)
MCHC: 33.3 g/dL (ref 31.5–35.7)
MCV: 83 fL (ref 79–97)
Platelets: 403 10*3/uL (ref 150–450)
RBC: 4.51 x10E6/uL (ref 3.77–5.28)
RDW: 15.7 % — ABNORMAL HIGH (ref 11.7–15.4)
WBC: 5.9 10*3/uL (ref 3.4–10.8)

## 2019-05-22 LAB — LIPID PANEL
Chol/HDL Ratio: 2.1 ratio (ref 0.0–4.4)
Cholesterol, Total: 223 mg/dL — ABNORMAL HIGH (ref 100–199)
HDL: 108 mg/dL (ref >39)
LDL Chol Calc (NIH): 100 mg/dL — ABNORMAL HIGH (ref 0–99)
Triglycerides: 89 mg/dL (ref 0–149)
VLDL Cholesterol Cal: 15 mg/dL (ref 5–40)

## 2019-05-22 LAB — TSH: TSH: 3.51 u[IU]/mL (ref 0.450–4.500)

## 2019-05-22 LAB — IRON AND TIBC
Iron Saturation: 8 % — CL (ref 15–55)
Iron: 27 ug/dL (ref 27–159)
Total Iron Binding Capacity: 352 ug/dL (ref 250–450)
UIBC: 325 ug/dL (ref 131–425)

## 2019-05-22 LAB — HEMOGLOBIN A1C
Est. average glucose Bld gHb Est-mCnc: 94 mg/dL
Hgb A1c MFr Bld: 4.9 % (ref 4.8–5.6)

## 2019-05-22 LAB — FERRITIN: Ferritin: 11 ng/mL — ABNORMAL LOW (ref 15–150)

## 2019-05-30 ENCOUNTER — Telehealth: Payer: Self-pay | Admitting: *Deleted

## 2019-05-30 DIAGNOSIS — E875 Hyperkalemia: Secondary | ICD-10-CM

## 2019-05-30 NOTE — Telephone Encounter (Signed)
Please place future orders for lab appt.  

## 2019-05-31 NOTE — Telephone Encounter (Signed)
Ordered

## 2019-06-02 ENCOUNTER — Other Ambulatory Visit: Payer: Self-pay | Admitting: Family Medicine

## 2019-06-02 DIAGNOSIS — E611 Iron deficiency: Secondary | ICD-10-CM

## 2019-06-03 ENCOUNTER — Encounter: Payer: Self-pay | Admitting: Family Medicine

## 2019-06-03 ENCOUNTER — Other Ambulatory Visit: Payer: Managed Care, Other (non HMO)

## 2019-06-04 ENCOUNTER — Other Ambulatory Visit (INDEPENDENT_AMBULATORY_CARE_PROVIDER_SITE_OTHER): Payer: Managed Care, Other (non HMO)

## 2019-06-04 ENCOUNTER — Other Ambulatory Visit: Payer: Self-pay

## 2019-06-04 DIAGNOSIS — E875 Hyperkalemia: Secondary | ICD-10-CM | POA: Diagnosis not present

## 2019-06-05 LAB — POTASSIUM: Potassium: 5.3 mmol/L — ABNORMAL HIGH (ref 3.5–5.2)

## 2019-06-08 NOTE — Progress Notes (Signed)
[facility]  [phone] Fax:(336) [phone]  ID: Anne James OB: 19-Feb-1971  MR#: [id number]  UMP#:[id number]  Patient Care Team: Leone Haven, MD as PCP - General (Family Medicine) Lloyd Huger, MD as Consulting Physician (Hematology and Oncology)  CHIEF COMPLAINT: Iron deficiency.  INTERVAL HISTORY: Patient is a 49 year old female with history of bariatric surgery who was noted to have decreased iron stores on routine blood work.  She has a normal hemoglobin.  She reports she is intolerant to oral iron supplementation.  She has increased fatigue, but otherwise feels well.  She has no neurologic complaints.  She denies any recent fevers or illnesses.  She has a good appetite and denies weight loss.  She has no chest pain, shortness of breath, cough, or hemoptysis.  She denies any nausea, vomiting, constipation, or diarrhea.  She has no melena or hematochezia.  She has no urinary complaints.  Patient offers no further specific complaints today.  REVIEW OF SYSTEMS:   Review of Systems  Constitutional: Positive for malaise/fatigue. Negative for fever and weight loss.  Respiratory: Negative.  Negative for cough, hemoptysis and shortness of breath.   Cardiovascular: Negative.  Negative for chest pain and leg swelling.  Gastrointestinal: Negative.  Negative for abdominal pain, blood in stool and melena.  Genitourinary: Negative.  Negative for hematuria.  Musculoskeletal: Negative.  Negative for back pain.  Skin: Negative.  Negative for rash.  Neurological: Negative.  Negative for dizziness, focal weakness, weakness and headaches.  Psychiatric/Behavioral: Negative.  The patient is not nervous/anxious.     As per HPI. Otherwise, a complete review of systems is negative.  PAST MEDICAL HISTORY: Past Medical History:  Diagnosis Date  . Anemia    low iron  . Anxiety   . 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 Chicken pox   . Depression   .  Eating disorder   . GERD (gastroesophageal reflux disease)   . History of endometrial ablation 09/17/2016   In 2006. No periods since 2006.  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 Hypertension   . Phlebitis   . Primary localized osteoarthritis of left knee   . Primary localized osteoarthritis of right knee 01/06/2016  . Pulmonary embolism ([place])    after gastric bypass  . 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 CHOLECYSTECTOMY N/A 09/18/2016   Procedure: LAPAROSCOPIC CHOLECYSTECTOMY;  Surgeon: Jules Husbands, MD;  Location: [facility] ORS;  Service: General;  Laterality: N/A;  . EPIGASTRIC HERNIA REPAIR  02/02/2018   Procedure: INTERNAL HERNIA  REDUCTION;  Surgeon: Jules Husbands, MD;  Location: [facility] ORS;  Service: General;;  . ESOPHAGOGASTRODUODENOSCOPY (EGD) WITH PROPOFOL N/A 02/02/2018   Procedure: ESOPHAGOGASTRODUODENOSCOPY (EGD) WITH PROPOFOL;  Surgeon: Toledo, Benay Pike, MD;  Location: [facility] ENDOSCOPY;  Service: Gastroenterology;  Laterality: N/A;  . GASTRIC BYPASS  2002   [facility], Dr. Fatima Sanger  . LAPAROSCOPY N/A 02/02/2018   Procedure: LAPAROSCOPY DIAGNOSTIC CONVERTED TO OPEN;  Surgeon: Jules Husbands, MD;  Location: [facility] ORS;  Service: General;  Laterality: N/A;  . NOVASURE ABLATION    . TOTAL KNEE ARTHROPLASTY Left 06/29/2015   Procedure: LEFT TOTAL KNEE ARTHROPLASTY;  Surgeon: Elsie Saas, MD;  Location: [facility];  Service: Orthopedics;  Laterality: Left;  . TOTAL KNEE ARTHROPLASTY Right 01/18/2016   Procedure: TOTAL KNEE ARTHROPLASTY;  Surgeon: Elsie Saas, MD;  Location:  [facility];  Service: Orthopedics;  Laterality: Right;  . TUBAL LIGATION      FAMILY HISTORY: Family History  Problem Relation Age of Onset  . Alcohol abuse Mother   . Drug abuse Mother   . CAD Mother   . Arthritis Maternal Grandmother   . Heart disease Maternal Grandmother   . Stroke  Maternal Grandmother   . Hypertension Maternal Grandmother   . Kidney disease Maternal Grandmother   . Diabetes Maternal Grandmother   . Anxiety disorder Brother   . Hypertension Father     ADVANCED DIRECTIVES (Y/N):  N  HEALTH MAINTENANCE: Social History   Tobacco Use  . Smoking status: Never Smoker  . Smokeless tobacco: Never Used  Substance Use Topics  . Alcohol use: Not Currently  . Drug use: No     Colonoscopy:  PAP:  Bone density:  Lipid panel:  No Known Allergies  Current Outpatient Medications  Medication Sig Dispense Refill  . buPROPion (WELLBUTRIN XL) 150 MG 24 hr tablet Take 1 tablet (150 mg total) by mouth daily. 90 tablet 1  . escitalopram (LEXAPRO) 20 MG tablet Take 1 tablet (20 mg total) by mouth daily. 30 tablet 0  . famotidine (PEPCID) 20 MG tablet Take 1 tablet (20 mg total) by mouth 2 (two) times daily. (Patient taking differently: Take 20 mg by mouth as needed. ) 60 tablet 0  . ipratropium (ATROVENT) 0.06 % nasal spray USE 2 SPRAY(S) IN EACH NOSTRIL 3 TO 4 TIMES DAILY AS NEEDED FOR ALLERGIES    . Multiple Vitamin (MULTIVITAMIN) capsule Take 1 capsule by mouth daily.    . clonazePAM (KLONOPIN) 0.5 MG tablet Take 0.5 tablets (0.25 mg total) by mouth 2 (two) times daily as needed for anxiety. Please call office to schedule follow-up. (Patient not taking: Reported on 06/13/2019) 10 tablet 0  . ferrous sulfate 325 (65 FE) MG EC tablet Take 1 tablet (325 mg total) by mouth 2 (two) times daily with a meal. (Patient not taking: Reported on 06/13/2019) 60 tablet 3  . ondansetron (ZOFRAN ODT) 4 MG disintegrating tablet Take 1 tablet (4 mg total) by mouth every 8 (eight) hours as needed for nausea or vomiting. (Patient not taking: Reported on 06/13/2019) 20 tablet 0  . promethazine (PHENERGAN) 25 MG suppository Place 1 suppository (25 mg total) rectally every 6 (six) hours as needed for nausea. (Patient not taking: Reported on 08/10/2018) 12 suppository 1  . sucralfate  (CARAFATE) 1 g tablet Take 1 tablet (1 g total) by mouth 4 (four) times daily. (Patient not taking: Reported on 06/13/2019) 120 tablet 1   No current facility-administered medications for this visit.    OBJECTIVE: Vitals:   06/13/19 1129  BP: 138/68  Pulse: 78  Resp: 18  Temp: 97.7 F (36.5 C)  SpO2: 98%     Body mass index is 36.68 kg/m.    ECOG FS:0 - Asymptomatic  General: Well-developed, well-nourished, no acute distress. Eyes: Pink conjunctiva, anicteric sclera. HEENT: Normocephalic, moist mucous membranes. Lungs: No audible wheezing or coughing. Heart: Regular rate and rhythm. Abdomen: Soft, nontender, no obvious distention. Musculoskeletal: No edema, cyanosis, or clubbing. Neuro: Alert, answering all questions appropriately. Cranial nerves grossly intact. Skin: No rashes or petechiae noted. Psych: Normal affect. Lymphatics: No cervical, calvicular, axillary or inguinal LAD.   LAB RESULTS:  Lab Results  Component Value Date   NA 138 05/21/2019   K 5.3 (H) 06/04/2019   CL 102 05/21/2019   CO2 23 05/21/2019   GLUCOSE  86 05/21/2019   BUN 12 05/21/2019   CREATININE 0.69 05/21/2019   CALCIUM 9.0 05/21/2019   PROT 6.7 05/21/2019   ALBUMIN 4.1 05/21/2019   AST 24 05/21/2019   ALT 17 05/21/2019   ALKPHOS 73 05/21/2019   BILITOT 0.3 05/21/2019   GFRNONAA 102 05/21/2019   GFRAA 117 05/21/2019    Lab Results  Component Value Date   WBC 5.9 05/21/2019   NEUTROABS 5.7 09/26/2016   HGB 12.4 05/21/2019   HCT 37.2 05/21/2019   MCV 83 05/21/2019   PLT 403 05/21/2019   Lab Results  Component Value Date   IRON 27 05/21/2019   TIBC 352 05/21/2019   IRONPCTSAT 8 (LL) 05/21/2019   Lab Results  Component Value Date   FERRITIN 11 (L) 05/21/2019     STUDIES: No results found.  ASSESSMENT: Iron deficiency.  PLAN:    1. Iron deficiency: Likely secondary to poor absorption from history of bariatric surgery.  Patient is also intolerant to oral iron  supplementation.  Iron stores are significantly reduced, but her hemoglobin is within normal limits.  Patient will return to clinic in 1, 2, and 3 weeks to receive 200 mg IV Venofer.  Patient may require maintenance treatment given her poor absorption of iron.  Return to clinic in 4 months with repeat laboratory work, consideration of additional IV iron, and further evaluation.    I spent a total of 45 minutes reviewing chart data, face-to-face evaluation with the patient, counseling and coordination of care as detailed above.  Patient expressed understanding and was in agreement with this plan. She also understands that She can call clinic at any time with any questions, concerns, or complaints.    Jeralyn Ruths, MD   06/13/2019 12:26 PM

## 2019-06-13 ENCOUNTER — Inpatient Hospital Stay: Payer: Managed Care, Other (non HMO) | Attending: Oncology | Admitting: Oncology

## 2019-06-13 ENCOUNTER — Encounter: Payer: Self-pay | Admitting: Oncology

## 2019-06-13 ENCOUNTER — Inpatient Hospital Stay: Payer: Managed Care, Other (non HMO)

## 2019-06-13 ENCOUNTER — Other Ambulatory Visit: Payer: Self-pay

## 2019-06-13 VITALS — BP 138/68 | HR 78 | Temp 97.7°F | Resp 18 | Wt 220.4 lb

## 2019-06-13 DIAGNOSIS — Z86711 Personal history of pulmonary embolism: Secondary | ICD-10-CM | POA: Insufficient documentation

## 2019-06-13 DIAGNOSIS — Z9884 Bariatric surgery status: Secondary | ICD-10-CM | POA: Diagnosis not present

## 2019-06-13 DIAGNOSIS — Z86718 Personal history of other venous thrombosis and embolism: Secondary | ICD-10-CM | POA: Insufficient documentation

## 2019-06-13 DIAGNOSIS — K219 Gastro-esophageal reflux disease without esophagitis: Secondary | ICD-10-CM | POA: Diagnosis not present

## 2019-06-13 DIAGNOSIS — I10 Essential (primary) hypertension: Secondary | ICD-10-CM | POA: Insufficient documentation

## 2019-06-13 DIAGNOSIS — F418 Other specified anxiety disorders: Secondary | ICD-10-CM | POA: Insufficient documentation

## 2019-06-13 DIAGNOSIS — Z79899 Other long term (current) drug therapy: Secondary | ICD-10-CM | POA: Insufficient documentation

## 2019-06-13 DIAGNOSIS — D509 Iron deficiency anemia, unspecified: Secondary | ICD-10-CM | POA: Diagnosis present

## 2019-06-13 DIAGNOSIS — R5383 Other fatigue: Secondary | ICD-10-CM | POA: Insufficient documentation

## 2019-06-13 DIAGNOSIS — D508 Other iron deficiency anemias: Secondary | ICD-10-CM

## 2019-06-13 NOTE — Progress Notes (Signed)
New patient here for initial visit. States not taking ferrous sulfate due to constipation. Is dealing with some insomnia 3 - 4 times a week. Denies any pain.

## 2019-06-16 ENCOUNTER — Other Ambulatory Visit: Payer: Self-pay | Admitting: Family Medicine

## 2019-06-19 ENCOUNTER — Ambulatory Visit: Payer: Managed Care, Other (non HMO) | Admitting: Family Medicine

## 2019-06-20 ENCOUNTER — Other Ambulatory Visit: Payer: Self-pay

## 2019-06-20 ENCOUNTER — Inpatient Hospital Stay: Payer: Managed Care, Other (non HMO)

## 2019-06-20 VITALS — BP 150/97 | HR 72 | Resp 18

## 2019-06-20 DIAGNOSIS — D509 Iron deficiency anemia, unspecified: Secondary | ICD-10-CM | POA: Diagnosis not present

## 2019-06-20 DIAGNOSIS — D508 Other iron deficiency anemias: Secondary | ICD-10-CM

## 2019-06-20 MED ORDER — IRON SUCROSE 20 MG/ML IV SOLN
200.0000 mg | Freq: Once | INTRAVENOUS | Status: AC
  Administered 2019-06-20: 200 mg via INTRAVENOUS
  Filled 2019-06-20: qty 10

## 2019-06-20 MED ORDER — SODIUM CHLORIDE 0.9 % IV SOLN
Freq: Once | INTRAVENOUS | Status: AC
  Filled 2019-06-20: qty 250

## 2019-06-20 NOTE — Progress Notes (Signed)
Pt tolerated Venofer infusion well with no signs of a reaction or complications. RN educated pt on the importance of notifying the clinic if any complications occur at home and if it is an emergency to call 911. Pt verbalized understanding and all questions answered at this time. VSS, pt stable for discharge.   Anne James [employer]

## 2019-06-27 ENCOUNTER — Inpatient Hospital Stay: Payer: Managed Care, Other (non HMO)

## 2019-06-27 ENCOUNTER — Other Ambulatory Visit: Payer: Self-pay

## 2019-06-27 VITALS — BP 140/86 | HR 77 | Temp 96.6°F | Resp 18

## 2019-06-27 DIAGNOSIS — D509 Iron deficiency anemia, unspecified: Secondary | ICD-10-CM | POA: Diagnosis not present

## 2019-06-27 DIAGNOSIS — D508 Other iron deficiency anemias: Secondary | ICD-10-CM

## 2019-06-27 MED ORDER — IRON SUCROSE 20 MG/ML IV SOLN
200.0000 mg | Freq: Once | INTRAVENOUS | Status: AC
  Administered 2019-06-27: 200 mg via INTRAVENOUS
  Filled 2019-06-27: qty 10

## 2019-06-27 MED ORDER — SODIUM CHLORIDE 0.9 % IV SOLN
Freq: Once | INTRAVENOUS | Status: AC
  Filled 2019-06-27: qty 250

## 2019-06-28 ENCOUNTER — Telehealth: Payer: Self-pay | Admitting: Family Medicine

## 2019-06-28 NOTE — Telephone Encounter (Signed)
I have been unable to reach this patient by phone.  A letter is being sent.   "Rejection Reason - Patient did not respond" [facility] said about 23 hours ago

## 2019-06-28 NOTE — Telephone Encounter (Signed)
Noted.  I will continue to discuss on follow-up.

## 2019-07-02 ENCOUNTER — Encounter: Payer: Self-pay | Admitting: Family Medicine

## 2019-07-04 ENCOUNTER — Inpatient Hospital Stay: Payer: Managed Care, Other (non HMO)

## 2019-07-04 ENCOUNTER — Other Ambulatory Visit: Payer: Self-pay

## 2019-07-04 VITALS — BP 131/83 | HR 77 | Temp 98.8°F | Resp 17

## 2019-07-04 DIAGNOSIS — D509 Iron deficiency anemia, unspecified: Secondary | ICD-10-CM | POA: Diagnosis not present

## 2019-07-04 DIAGNOSIS — D508 Other iron deficiency anemias: Secondary | ICD-10-CM

## 2019-07-04 MED ORDER — IRON SUCROSE 20 MG/ML IV SOLN
200.0000 mg | Freq: Once | INTRAVENOUS | Status: AC
  Administered 2019-07-04: 200 mg via INTRAVENOUS
  Filled 2019-07-04: qty 10

## 2019-07-04 MED ORDER — SODIUM CHLORIDE 0.9 % IV SOLN
Freq: Once | INTRAVENOUS | Status: AC
  Filled 2019-07-04: qty 250

## 2019-07-04 NOTE — Progress Notes (Signed)
Pt tolerated infusion well. Pt and VS stable at discharge.  

## 2019-07-16 ENCOUNTER — Ambulatory Visit: Payer: Managed Care, Other (non HMO) | Admitting: Family Medicine

## 2019-07-16 DIAGNOSIS — Z0289 Encounter for other administrative examinations: Secondary | ICD-10-CM

## 2019-07-23 ENCOUNTER — Other Ambulatory Visit: Payer: Self-pay | Admitting: Family Medicine

## 2019-08-29 ENCOUNTER — Other Ambulatory Visit: Payer: Self-pay | Admitting: Family Medicine

## 2019-09-17 ENCOUNTER — Other Ambulatory Visit: Payer: Self-pay

## 2019-09-17 MED ORDER — BUPROPION HCL ER (XL) 150 MG PO TB24: 150.0000 mg | ORAL_TABLET | Freq: Every day | ORAL | 0 refills | Status: DC

## 2019-09-17 NOTE — Telephone Encounter (Signed)
She needs a follow-up visit. I sent in one refill, though please call her and set up follow-up. Thanks.

## 2019-09-19 NOTE — Telephone Encounter (Signed)
I called and spoke with the patient and she scheduled a physical with the provider with me.  Ara Mano,cma

## 2019-09-30 ENCOUNTER — Encounter: Payer: Managed Care, Other (non HMO) | Admitting: Family Medicine

## 2019-10-07 ENCOUNTER — Encounter: Payer: Self-pay | Admitting: Family Medicine

## 2019-10-07 ENCOUNTER — Ambulatory Visit (INDEPENDENT_AMBULATORY_CARE_PROVIDER_SITE_OTHER): Payer: Managed Care, Other (non HMO) | Admitting: Family Medicine

## 2019-10-07 ENCOUNTER — Other Ambulatory Visit: Payer: Self-pay

## 2019-10-07 ENCOUNTER — Other Ambulatory Visit (HOSPITAL_COMMUNITY)
Admission: RE | Admit: 2019-10-07 | Discharge: 2019-10-07 | Disposition: A | Discharge status: Home / Self Care | Payer: Managed Care, Other (non HMO) | Source: Ambulatory Visit | Attending: Family Medicine | Admitting: Family Medicine

## 2019-10-07 VITALS — BP 110/80 | HR 78 | Temp 97.6°F | Ht 65.0 in | Wt 221.1 lb

## 2019-10-07 DIAGNOSIS — Z124 Encounter for screening for malignant neoplasm of cervix: Secondary | ICD-10-CM | POA: Diagnosis present

## 2019-10-07 DIAGNOSIS — Z23 Encounter for immunization: Secondary | ICD-10-CM | POA: Diagnosis not present

## 2019-10-07 DIAGNOSIS — Z0001 Encounter for general adult medical examination with abnormal findings: Secondary | ICD-10-CM | POA: Diagnosis not present

## 2019-10-07 DIAGNOSIS — E669 Obesity, unspecified: Secondary | ICD-10-CM

## 2019-10-07 DIAGNOSIS — Z13220 Encounter for screening for lipoid disorders: Secondary | ICD-10-CM

## 2019-10-07 DIAGNOSIS — Z1159 Encounter for screening for other viral diseases: Secondary | ICD-10-CM

## 2019-10-07 DIAGNOSIS — M25551 Pain in right hip: Secondary | ICD-10-CM | POA: Diagnosis not present

## 2019-10-07 DIAGNOSIS — Z1231 Encounter for screening mammogram for malignant neoplasm of breast: Secondary | ICD-10-CM

## 2019-10-07 DIAGNOSIS — Z1211 Encounter for screening for malignant neoplasm of colon: Secondary | ICD-10-CM

## 2019-10-07 DIAGNOSIS — Z1329 Encounter for screening for other suspected endocrine disorder: Secondary | ICD-10-CM | POA: Diagnosis not present

## 2019-10-07 DIAGNOSIS — Z1152 Encounter for screening for COVID-19: Secondary | ICD-10-CM

## 2019-10-07 NOTE — Assessment & Plan Note (Signed)
Physical exam completed.  Encouraged dietary changes and exercise.  Pap smear completed.  She will call to schedule her mammogram.  Referral for colonoscopy placed.  Discussed COVID-19 vaccine and I encouraged her to get this.  Tetanus vaccine and Shingrix given today.  Discussed that she would have to separate the COVID-19 vaccine by at least 14 days from any other vaccines.  Lab work as outlined below.

## 2019-10-07 NOTE — Progress Notes (Signed)
Anne Alar, MD Phone: [phone]  Anne James is a 49 y.o. female who presents today for CPE.  Diet: is working on decreasing chips and crackers, also going to start with protein shakes Exercise: no Pap smear: due Colonoscopy: due Mammogram: due Family history-  Colon cancer: no  Breast cancer: no  Ovarian cancer: no Menses: s/p ablation, no bleeding Vaccines-   Flu: out of season  Tetanus: due  Shingles: due  COVID19: due HIV screening: UTD Hep C Screening: due Tobacco use: no Alcohol use: 6/week Illicit Drug use: no Dentist: yes Ophthalmology: due  Arthritis: Notes she would like to go back on her celebrex for OA. She has been off of this for the past couple of years.  Notes worsened arthritic pain particularly in her hips recently.  Notes she was on Celebrex long after her gastric bypass and did not have any gastric issues.   Active Ambulatory Problems    Diagnosis Date Noted  . Osteoarthritis of both knees 10/17/2011  . Obesity (BMI 30-39.9) 10/17/2011  . Right hip pain 02/23/2012  . Encounter for general adult medical examination with abnormal findings 10/31/2012  . Chronic fatigue 07/02/2014  . Anxiety and depression 06/08/2015  . Primary localized osteoarthritis of left knee   . Neck pain 11/30/2015  . 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 Primary localized osteoarthritis of right knee 01/06/2016  . History of pulmonary embolus (PE) 03/14/2000  . Status post right knee replacement 03/02/2016  . Hot flashes 09/12/2016  . H/O bariatric surgery 12/14/2016  . Iron deficiency anemia secondary to inadequate dietary iron intake 12/14/2016  . Ear popping, left 10/10/2017  . Intussusception of jejunum (HCC) 01/31/2018  . Intractable vomiting   . Bilateral shoulder pain 05/21/2019   Resolved Ambulatory Problems    Diagnosis Date Noted  . Depression 10/17/2011  . Screening for breast cancer 10/17/2011  . Wart 10/17/2011  . Right knee pain  02/23/2012  . Preop examination 06/08/2015  . Strep throat 07/04/2016  . Epigastric abdominal pain 09/12/2016  . Abdominal pain 09/17/2016  . Acute cholecystitis    Past Medical History:  Diagnosis Date  . Anemia   . Anxiety   . Arthritis   . Chicken pox   . Eating disorder   . GERD (gastroesophageal reflux disease)   . History of endometrial ablation 09/17/2016  . Hypertension   . Phlebitis   . Pulmonary embolism (HCC)     Family History  Problem Relation Age of Onset  . Alcohol abuse Mother   . Drug abuse Mother   . CAD Mother   . Arthritis Maternal Grandmother   . Heart disease Maternal Grandmother   . Stroke Maternal Grandmother   . Hypertension Maternal Grandmother   . Kidney disease Maternal Grandmother   . Diabetes Maternal Grandmother   . Anxiety disorder Brother   . Hypertension Father     Social History   Socioeconomic History  . Marital status: Married    Spouse name: Not on file  . Number of children: Not on file  . Years of education: Not on file  . Highest education level: Not on file  Occupational History  . Not on file  Tobacco Use  . Smoking status: Never Smoker  . Smokeless tobacco: Never Used  Vaping Use  . Vaping Use: Never used  Substance and Sexual Activity  . Alcohol use: Not Currently  . Drug use: No  . Sexual activity: Not on file    Comment:  LMP 2006 following endometrial ablation  Other Topics Concern  . Not on file  Social History Narrative   Lives in [place].      Work - [employer]   Diet - Regular    Exercise - limited by right knee pain   Social Determinants of Health   Financial Resource Strain:   . Difficulty of Paying Living Expenses:   Food Insecurity:   . Worried About Charity fundraiser in the Last Year:   . Arboriculturist in the Last Year:   Transportation Needs:   . Film/video editor (Medical):   [name] Lack of Transportation (Non-Medical):   Physical Activity:   . Days of Exercise per Week:   . Minutes  of Exercise per Session:   Stress:   . Feeling of Stress :   Social Connections:   . Frequency of Communication with Friends and Family:   . Frequency of Social Gatherings with Friends and Family:   . Attends Religious Services:   . Active Member of Clubs or Organizations:   . Attends Archivist Meetings:   [name] Marital Status:   Intimate Partner Violence:   . Fear of Current or Ex-Partner:   . Emotionally Abused:   [name] Physically Abused:   . Sexually Abused:     ROS  General:  Negative for nexplained weight loss, fever Skin: Negative for new or changing mole, sore that won't heal HEENT: Negative for trouble hearing, trouble seeing, ringing in ears, mouth sores, hoarseness, change in voice, dysphagia. CV:  Negative for chest pain, dyspnea, edema, palpitations Resp: Negative for cough, dyspnea, hemoptysis GI: Negative for nausea, vomiting, diarrhea, constipation, abdominal pain, melena, hematochezia. GU: Negative for dysuria, incontinence, urinary hesitance, hematuria, vaginal or penile discharge, polyuria, sexual difficulty, lumps in testicle or breasts MSK: Positive for chronic joint pain, negative for muscle cramps or aches, joint swelling Neuro: Negative for headaches, weakness, numbness, dizziness, passing out/fainting Psych: Negative for depression, anxiety, memory problems  Objective  Physical Exam Vitals:   10/07/19 0837  BP: 110/80  Pulse: 78  Temp: 97.6 F (36.4 C)  SpO2: 99%    BP Readings from Last 3 Encounters:  10/07/19 110/80  07/04/19 131/83  06/27/19 140/86   Wt Readings from Last 3 Encounters:  10/07/19 (!) 221 lb 1.9 oz (100.3 kg)  06/13/19 220 lb 6.4 oz (100 kg)  05/21/19 219 lb 12.8 oz (99.7 kg)    Physical Exam Constitutional:      General: She is not in acute distress.    Appearance: She is not diaphoretic.  Eyes:     Conjunctiva/sclera: Conjunctivae normal.     Pupils: Pupils are equal, round, and reactive to light.    Cardiovascular:     Rate and Rhythm: Normal rate and regular rhythm.     Heart sounds: Normal heart sounds.  Pulmonary:     Effort: Pulmonary effort is normal.     Breath sounds: Normal breath sounds.  Abdominal:     General: Bowel sounds are normal. There is no distension.     Palpations: Abdomen is soft.     Tenderness: There is no abdominal tenderness. There is no guarding or rebound.  Genitourinary:    Comments: Fulton Mole, CMA served as chaperone, normal labia, normal external genitals, normal vaginal mucosa, normal cervix, no cervical motion tenderness, no adnexal masses or tenderness, uterus is midline and mobile, bilateral breast with no skin changes, nipple inversion, masses, or tenderness, no axillary masses bilaterally  Musculoskeletal:     Right lower leg: No edema.     Left lower leg: No edema.  Lymphadenopathy:     Cervical: No cervical adenopathy.  Skin:    General: Skin is warm and dry.  Neurological:     Mental Status: She is alert.  Psychiatric:        Mood and Affect: Mood normal.      Assessment/Plan:   Encounter for general adult medical examination with abnormal findings Physical exam completed.  Encouraged dietary changes and exercise.  Pap smear completed.  She will call to schedule her mammogram.  Referral for colonoscopy placed.  Discussed COVID-19 vaccine and I encouraged her to get this.  Tetanus vaccine and Shingrix given today.  Discussed that she would have to separate the COVID-19 vaccine by at least 14 days from any other vaccines.  Lab work as outlined below.  Right hip pain Complains of chronic issues with joint pain.  She wonders about going back on Celebrex.  Discussed that this would not be the best option given her history of gastric bypass though she had been on it for 16 or so years after her gastric bypass in 2002 with no issues. Discussed we could consider this though she would need to go on prophylactic PPI if we were going to proceed  with this. Advised I would review her chart and then we would get back to her on this.    Orders Placed This Encounter  Procedures  . MM 3D SCREEN BREAST BILATERAL    Standing Status:   Future    Standing Expiration Date:   10/06/2020    Order Specific Question:   Reason for Exam (SYMPTOM  OR DIAGNOSIS REQUIRED)    Answer:   breast cancer screening    Order Specific Question:   Is the patient pregnant?    Answer:   No    Order Specific Question:   Preferred imaging location?    Answer:    [facility]  . Varicella-zoster vaccine IM (Shingrix)  . Td : Tetanus/diphtheria >7yo Preservative  free  . Comp Met (CMET)  . Lipid panel  . HgB A1c  . TSH  . Hepatitis C Antibody  . SARS-CoV-2 Semi-Quantitative Total Antibody, Spike    Order Specific Question:   Is this test for diagnosis or screening    Answer:   Screening    Order Specific Question:   Symptomatic for COVID-19 as defined by CDC    Answer:   No    Order Specific Question:   Hospitalized for COVID-19    Answer:   No    Order Specific Question:   Admitted to ICU for COVID-19    Answer:   No    Order Specific Question:   Previously tested for COVID-19    Answer:   No    Order Specific Question:   Resident in a congregate (group) care setting    Answer:   No    Order Specific Question:   Employed in healthcare setting    Answer:   No    Order Specific Question:   Pregnant    Answer:   No  . Ambulatory referral to Gastroenterology    Referral Priority:   Routine    Referral Type:   Consultation    Referral Reason:   Specialty Services Required    Number of Visits Requested:   1    No orders of the defined types were placed in this encounter.  This visit occurred during the SARS-CoV-2 public health emergency.  Safety protocols were in place, including screening questions prior to the visit, additional usage of staff PPE, and extensive cleaning of exam room while observing appropriate contact time as indicated for  disinfecting solutions.    Tommi Rumps, MD [place]

## 2019-10-07 NOTE — Patient Instructions (Addendum)
Nice to see you. Please try to work on adding in some exercise and work on the dietary changes you mentioned. Please call to schedule your mammogram. Someone will call you to schedule your colonoscopy. We'll contact you with your lab results. Please consider getting the COVID-19 vaccine. You must wait at least 14 days between having other vaccines and getting the COVID-19 vaccine.

## 2019-10-07 NOTE — Assessment & Plan Note (Addendum)
Complains of chronic issues with joint pain.  She wonders about going back on Celebrex.  Discussed that this would not be the best option given her history of gastric bypass though she had been on it for 16 or so years after her gastric bypass in 2002 with no issues. Discussed we could consider this though she would need to go on prophylactic PPI if we were going to proceed with this. Advised I would review her chart and then we would get back to her on this.

## 2019-10-08 ENCOUNTER — Other Ambulatory Visit: Payer: Self-pay | Admitting: Family Medicine

## 2019-10-08 ENCOUNTER — Encounter: Payer: Self-pay | Admitting: Family Medicine

## 2019-10-08 LAB — CYTOLOGY - PAP
Comment: NEGATIVE
Diagnosis: NEGATIVE
High risk HPV: NEGATIVE

## 2019-10-08 LAB — LIPID PANEL
Chol/HDL Ratio: 1.9 ratio (ref 0.0–4.4)
Cholesterol, Total: 249 mg/dL — ABNORMAL HIGH (ref 100–199)
HDL: 130 mg/dL (ref >39)
LDL Chol Calc (NIH): 111 mg/dL — ABNORMAL HIGH (ref 0–99)
Triglycerides: 50 mg/dL (ref 0–149)
VLDL Cholesterol Cal: 8 mg/dL (ref 5–40)

## 2019-10-08 LAB — COMPREHENSIVE METABOLIC PANEL
ALT: 20 IU/L (ref 0–32)
AST: 33 IU/L (ref 0–40)
Albumin/Globulin Ratio: 2 (ref 1.2–2.2)
Albumin: 4.3 g/dL (ref 3.8–4.9)
Alkaline Phosphatase: 85 IU/L (ref 48–121)
BUN/Creatinine Ratio: 18 (ref 9–23)
BUN: 14 mg/dL (ref 6–24)
Bilirubin Total: 0.5 mg/dL (ref 0.0–1.2)
CO2: 21 mmol/L (ref 20–29)
Calcium: 8.9 mg/dL (ref 8.7–10.2)
Chloride: 98 mmol/L (ref 96–106)
Creatinine, Ser: 0.79 mg/dL (ref 0.57–1.00)
GFR calc Af Amer: 100 mL/min/{1.73_m2} (ref >59)
GFR calc non Af Amer: 87 mL/min/{1.73_m2} (ref >59)
Globulin, Total: 2.1 g/dL (ref 1.5–4.5)
Glucose: 83 mg/dL (ref 65–99)
Potassium: 4.9 mmol/L (ref 3.5–5.2)
Sodium: 136 mmol/L (ref 134–144)
Total Protein: 6.4 g/dL (ref 6.0–8.5)

## 2019-10-08 LAB — TSH: TSH: 2.87 u[IU]/mL (ref 0.450–4.500)

## 2019-10-08 LAB — SARS-COV-2 SEMI-QUANTITATIVE TOTAL ANTIBODY, SPIKE
SARS-CoV-2 Semi-Quant Total Ab: <0.4 U/mL (ref <0.8)
SARS-CoV-2 Spike Ab Interp: NEGATIVE

## 2019-10-08 LAB — HEMOGLOBIN A1C
Est. average glucose Bld gHb Est-mCnc: 97 mg/dL
Hgb A1c MFr Bld: 5 % (ref 4.8–5.6)

## 2019-10-08 LAB — HEPATITIS C ANTIBODY: Hep C Virus Ab: <0.1 s/co ratio (ref 0.0–0.9)

## 2019-10-08 MED ORDER — ESCITALOPRAM OXALATE 20 MG PO TABS: 20.0000 mg | ORAL_TABLET | Freq: Every day | ORAL | 0 refills | Status: DC

## 2019-10-09 ENCOUNTER — Telehealth: Payer: Self-pay

## 2019-10-09 ENCOUNTER — Other Ambulatory Visit: Payer: Self-pay | Admitting: *Deleted

## 2019-10-09 DIAGNOSIS — D508 Other iron deficiency anemias: Secondary | ICD-10-CM

## 2019-10-10 NOTE — Telephone Encounter (Signed)
Signed. Please fax.

## 2019-10-10 NOTE — Telephone Encounter (Signed)
I called and spoke with the patient and informed her that her health screening form was ready for pick up and at the front desk.  She understood.  Sharine Cadle,cma

## 2019-10-13 NOTE — Progress Notes (Signed)
[facility]  [phone] [phone]) [phone]  ID: FAVOR KREH OB: 28-Nov-1970  MR#: [id number]  OIZ#:[id number]  Patient Care Team: Glori Luis, MD as PCP - General (Family Medicine) Jeralyn Ruths, MD as Consulting Physician (Hematology and Oncology)  I connected with Madelyn Flavors on 10/17/19 at  2:30 PM EDT by video enabled telemedicine visit and verified that I am speaking with the correct person using two identifiers.   I discussed the limitations, risks, security and privacy concerns of performing an evaluation and management service by telemedicine and the availability of in-person appointments. I also discussed with the patient that there may be a patient responsible charge related to this service. The patient expressed understanding and agreed to proceed.   Other persons participating in the visit and their role in the encounter: Patient, MD.  Patient's location: Home. Provider's location: Clinic.  CHIEF COMPLAINT: Iron deficiency.  INTERVAL HISTORY: Patient agreed to video assisted telemedicine visit for further evaluation and discussion of her laboratory results.  She does not complain of weakness or fatigue today.  She currently feels well and is asymptomatic.  She has no neurologic complaints.  She denies any recent fevers or illnesses.  She has a good appetite and denies weight loss.  She has no chest pain, shortness of breath, cough, or hemoptysis.  She denies any nausea, vomiting, constipation, or diarrhea.  She has no melena or hematochezia.  She has no urinary complaints.  Patient offers no specific complaints today.  REVIEW OF SYSTEMS:   Review of Systems  Constitutional: Negative.  Negative for fever, malaise/fatigue and weight loss.  Respiratory: Negative.  Negative for cough, hemoptysis and shortness of breath.   Cardiovascular: Negative.  Negative for chest pain and leg swelling.  Gastrointestinal: Negative.  Negative for  abdominal pain, blood in stool and melena.  Genitourinary: Negative.  Negative for hematuria.  Musculoskeletal: Negative.  Negative for back pain.  Skin: Negative.  Negative for rash.  Neurological: Negative.  Negative for dizziness, focal weakness, weakness and headaches.  Psychiatric/Behavioral: Negative.  The patient is not nervous/anxious.     As per HPI. Otherwise, a complete review of systems is negative.  PAST MEDICAL HISTORY: Past Medical History:  Diagnosis Date  . Anemia    low iron  . Anxiety   . 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 Chicken pox   . Depression   . Eating disorder   . GERD (gastroesophageal reflux disease)   . History of endometrial ablation 09/17/2016   In 2006. No periods since 2006.  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 Hypertension   . Phlebitis   . Primary localized osteoarthritis of left knee   . Primary localized osteoarthritis of right knee 01/06/2016  . 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 CHOLECYSTECTOMY N/A 09/18/2016   Procedure: LAPAROSCOPIC CHOLECYSTECTOMY;  Surgeon: Leafy Ro, MD;  Location: [facility] ORS;  Service: General;  Laterality: N/A;  . EPIGASTRIC HERNIA REPAIR  02/02/2018   Procedure: INTERNAL HERNIA  REDUCTION;  Surgeon: Leafy Ro, MD;  Location: [facility] ORS;  Service: General;;  . ESOPHAGOGASTRODUODENOSCOPY (EGD) WITH PROPOFOL N/A 02/02/2018   Procedure: ESOPHAGOGASTRODUODENOSCOPY (EGD) WITH PROPOFOL;  Surgeon: Toledo, Boykin Nearing, MD;  Location: [facility] ENDOSCOPY;  Service: Gastroenterology;  Laterality: N/A;  . GASTRIC  BYPASS  2002   [facility], Dr. Kennedy Bucker  . LAPAROSCOPY N/A 02/02/2018   Procedure: LAPAROSCOPY DIAGNOSTIC CONVERTED TO OPEN;  Surgeon: Leafy Ro, MD;  Location: [facility] ORS;  Service: General;  Laterality: N/A;  . NOVASURE  ABLATION    . TOTAL KNEE ARTHROPLASTY Left 06/29/2015   Procedure: LEFT TOTAL KNEE ARTHROPLASTY;  Surgeon: Salvatore Marvel, MD;  Location: [facility] OR;  Service: Orthopedics;  Laterality: Left;  . TOTAL KNEE ARTHROPLASTY Right 01/18/2016   Procedure: TOTAL KNEE ARTHROPLASTY;  Surgeon: Salvatore Marvel, MD;  Location: [facility] OR;  Service: Orthopedics;  Laterality: Right;  . TUBAL LIGATION      FAMILY HISTORY: Family History  Problem Relation Age of Onset  . Alcohol abuse Mother   . Drug abuse Mother   . CAD Mother   . Arthritis Maternal Grandmother   . Heart disease Maternal Grandmother   . Stroke Maternal Grandmother   . Hypertension Maternal Grandmother   . Kidney disease Maternal Grandmother   . Diabetes Maternal Grandmother   . Anxiety disorder Brother   . Hypertension Father     ADVANCED DIRECTIVES (Y/N):  N  HEALTH MAINTENANCE: Social History   Tobacco Use  . Smoking status: Never Smoker  . Smokeless tobacco: Never Used  Vaping Use  . Vaping Use: Never used  Substance Use Topics  . Alcohol use: Not Currently  . Drug use: No     Colonoscopy:  PAP:  Bone density:  Lipid panel:  No Known Allergies  Current Outpatient Medications  Medication Sig Dispense Refill  . buPROPion (WELLBUTRIN XL) 150 MG 24 hr tablet Take 1 tablet (150 mg total) by mouth daily. 90 tablet 0  . escitalopram (LEXAPRO) 20 MG tablet Take 1 tablet (20 mg total) by mouth daily. 30 tablet 0  . ipratropium (ATROVENT) 0.06 % nasal spray USE 2 SPRAY(S) IN EACH NOSTRIL 3 TO 4 TIMES DAILY AS NEEDED FOR ALLERGIES    . Multiple Vitamin (MULTIVITAMIN) capsule Take 1 capsule by mouth daily.    . promethazine (PHENERGAN) 25 MG suppository Place 1 suppository (25 mg total) rectally every 6 (six) hours as needed for nausea. (Patient not taking: Reported on 08/10/2018) 12 suppository 1   No current facility-administered medications for this visit.    OBJECTIVE: There were no vitals filed for this visit.   There is no  height or weight on file to calculate BMI.    ECOG FS:0 - Asymptomatic  General: Well-developed, well-nourished, no acute distress. Eyes: Pink conjunctiva, anicteric sclera. HEENT: Normocephalic, moist mucous membranes. Lungs: No audible wheezing or coughing. Heart: Regular rate and rhythm. Abdomen: Soft, nontender, no obvious distention. Musculoskeletal: No edema, cyanosis, or clubbing. Neuro: Alert, answering all questions appropriately. Cranial nerves grossly intact. Skin: No rashes or petechiae noted. Psych: Normal affect.   LAB RESULTS:  Lab Results  Component Value Date   NA 136 10/07/2019   K 4.9 10/07/2019   CL 98 10/07/2019   CO2 21 10/07/2019   GLUCOSE 83 10/07/2019   BUN 14 10/07/2019   CREATININE 0.79 10/07/2019   CALCIUM 8.9 10/07/2019   PROT 6.4 10/07/2019   ALBUMIN 4.3 10/07/2019   AST 33 10/07/2019   ALT 20 10/07/2019   ALKPHOS 85 10/07/2019   BILITOT 0.5 10/07/2019   GFRNONAA 87 10/07/2019   GFRAA 100 10/07/2019    Lab Results  Component Value Date   WBC 7.4 10/16/2019   NEUTROABS 3.8 10/16/2019   HGB 13.9 10/16/2019   HCT 41.0 10/16/2019  MCV 91.3 10/16/2019   PLT 289 10/16/2019   Lab Results  Component Value Date   IRON 42 10/16/2019   TIBC 290 10/16/2019   IRONPCTSAT 15 10/16/2019   Lab Results  Component Value Date   FERRITIN 21 10/16/2019     STUDIES: No results found.  ASSESSMENT: Iron deficiency.  PLAN:    1. Iron deficiency: Likely secondary to poor absorption from history of bariatric surgery.  Patient reports that she is intolerant to oral iron supplementation.  Her hemoglobin and iron stores are now within normal limits.  She does not require additional Venofer today.  Patient last received treatment on July 04, 2019.  No intervention is needed at this time.  Patient may require maintenance treatment given her poor absorption of iron.  Return to clinic in 6 months with repeat laboratory work and video assisted telemedicine  visit.  I provided 20 minutes of face-to-face video visit time during this encounter which included chart review, counseling, and coordination of care as documented above.   Patient expressed understanding and was in agreement with this plan. She also understands that She can call clinic at any time with any questions, concerns, or complaints.    Jeralyn Ruths, MD   10/17/2019 3:34 PM

## 2019-10-14 ENCOUNTER — Inpatient Hospital Stay: Payer: Managed Care, Other (non HMO)

## 2019-10-15 ENCOUNTER — Inpatient Hospital Stay: Payer: Managed Care, Other (non HMO) | Admitting: Oncology

## 2019-10-16 ENCOUNTER — Other Ambulatory Visit: Payer: Self-pay

## 2019-10-16 ENCOUNTER — Inpatient Hospital Stay: Payer: Managed Care, Other (non HMO) | Attending: Oncology

## 2019-10-16 DIAGNOSIS — D508 Other iron deficiency anemias: Secondary | ICD-10-CM

## 2019-10-16 DIAGNOSIS — D509 Iron deficiency anemia, unspecified: Secondary | ICD-10-CM | POA: Diagnosis present

## 2019-10-16 LAB — CBC WITH DIFFERENTIAL/PLATELET
Abs Immature Granulocytes: 0.02 10*3/uL (ref 0.00–0.07)
Basophils Absolute: 0 10*3/uL (ref 0.0–0.1)
Basophils Relative: 0 %
Eosinophils Absolute: 0.6 10*3/uL — ABNORMAL HIGH (ref 0.0–0.5)
Eosinophils Relative: 8 %
HCT: 41 % (ref 36.0–46.0)
Hemoglobin: 13.9 g/dL (ref 12.0–15.0)
Immature Granulocytes: 0 %
Lymphocytes Relative: 30 %
Lymphs Abs: 2.2 10*3/uL (ref 0.7–4.0)
MCH: 31 pg (ref 26.0–34.0)
MCHC: 33.9 g/dL (ref 30.0–36.0)
MCV: 91.3 fL (ref 80.0–100.0)
Monocytes Absolute: 0.8 10*3/uL (ref 0.1–1.0)
Monocytes Relative: 11 %
Neutro Abs: 3.8 10*3/uL (ref 1.7–7.7)
Neutrophils Relative %: 51 %
Platelets: 289 10*3/uL (ref 150–400)
RBC: 4.49 MIL/uL (ref 3.87–5.11)
RDW: 13.8 % (ref 11.5–15.5)
WBC: 7.4 10*3/uL (ref 4.0–10.5)
nRBC: 0 % (ref 0.0–0.2)

## 2019-10-16 LAB — IRON AND TIBC
Iron: 42 ug/dL (ref 28–170)
Saturation Ratios: 15 % (ref 10.4–31.8)
TIBC: 290 ug/dL (ref 250–450)
UIBC: 248 ug/dL

## 2019-10-16 LAB — FERRITIN: Ferritin: 21 ng/mL (ref 11–307)

## 2019-10-17 ENCOUNTER — Encounter: Payer: Self-pay | Admitting: Oncology

## 2019-10-17 ENCOUNTER — Inpatient Hospital Stay (HOSPITAL_BASED_OUTPATIENT_CLINIC_OR_DEPARTMENT_OTHER): Payer: Managed Care, Other (non HMO) | Admitting: Oncology

## 2019-10-17 DIAGNOSIS — D508 Other iron deficiency anemias: Secondary | ICD-10-CM | POA: Diagnosis not present

## 2019-10-17 NOTE — Progress Notes (Signed)
Patient denies any concerns today.  

## 2019-11-08 ENCOUNTER — Other Ambulatory Visit: Payer: Self-pay | Admitting: Family Medicine

## 2019-11-16 ENCOUNTER — Other Ambulatory Visit: Payer: Self-pay

## 2019-11-16 ENCOUNTER — Ambulatory Visit
Admission: EM | Admit: 2019-11-16 | Discharge: 2019-11-16 | Disposition: A | Discharge status: Home / Self Care | Payer: Managed Care, Other (non HMO) | Attending: Physician Assistant | Admitting: Physician Assistant

## 2019-11-16 ENCOUNTER — Encounter: Payer: Self-pay | Admitting: Emergency Medicine

## 2019-11-16 DIAGNOSIS — Z20822 Contact with and (suspected) exposure to covid-19: Secondary | ICD-10-CM | POA: Diagnosis not present

## 2019-11-16 NOTE — ED Provider Notes (Signed)
[name]    CSN: [id number] Arrival date & time: 11/16/19  1100      History   Chief Complaint Chief Complaint  Patient presents with   Cough   Nasal Congestion    HPI Anne James is a 49 y.o. female sho started with URI symptoms HA 3 days ago and has lost sence of smell and state since.  Has not had Covid injection, but her husband has had it.    Past Medical History:  Diagnosis Date   Anemia    low iron   Anxiety    Arthritis    bilateral, right>left   Chicken pox    Depression    Eating disorder    GERD (gastroesophageal reflux disease)    History of endometrial ablation 09/17/2016   In 2006. No periods since 2006.   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    Patient Active Problem List   Diagnosis Date Noted   Bilateral shoulder pain 05/21/2019   Intractable vomiting    Intussusception of jejunum (HCC) 01/31/2018   Ear popping, left 10/10/2017   H/O bariatric surgery 12/14/2016   Iron deficiency anemia secondary to inadequate dietary iron intake 12/14/2016   Hot flashes 09/12/2016   Status post right knee replacement 03/02/2016   Primary localized osteoarthritis of right knee 01/06/2016   Neck pain 11/30/2015   S/P total knee arthroplasty, 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   Primary localized osteoarthritis of left knee    Anxiety and depression 06/08/2015   Chronic fatigue 07/02/2014   Encounter for general adult medical examination with abnormal findings 10/31/2012   Right hip pain 02/23/2012   Osteoarthritis of both knees 10/17/2011   Obesity (BMI 30-39.9) 10/17/2011   History of pulmonary embolus (PE) 03/14/2000    Past Surgical History:    Procedure Laterality Date   CHOLECYSTECTOMY N/A 09/18/2016   Procedure: LAPAROSCOPIC CHOLECYSTECTOMY;  Surgeon: Leafy RoPabon, Diego F, MD;  Location: [facility];  Service: General;  Laterality: N/A;   EPIGASTRIC HERNIA REPAIR  02/02/2018   Procedure: INTERNAL HERNIA  REDUCTION;  Surgeon: Leafy RoPabon, Diego F, MD;  Location: [facility];  Service: General;;   ESOPHAGOGASTRODUODENOSCOPY (EGD) WITH PROPOFOL N/A 02/02/2018   Procedure: ESOPHAGOGASTRODUODENOSCOPY (EGD) WITH PROPOFOL;  Surgeon: Toledo, Boykin Nearingeodoro K, MD;  Location: [facility];  Service: Gastroenterology;  Laterality: N/A;   [facility]  [facility] Dr[facility]   LAPAROSCOPY N/A 02/02/2018   Procedure: LAPAROSCOPY DIAGNOSTIC CONVERTED TO OPEN;  Surgeon: Leafy RoPabon, Diego F, MD;  Location: [facility];  Service: General;  Laterality: N/A;   NOVASURE ABLATION     TOTAL KNEE ARTHROPLASTY Left 06/29/2015   Procedure: LEFT TOTAL KNEE ARTHROPLASTY;  Surgeon: Salvatore Marvelobert Wainer, MD;  Location: [facility];  Service: Orthopedics;  Laterality: Left;   TOTAL KNEE ARTHROPLASTY Right 01/18/2016   Procedure: TOTAL KNEE ARTHROPLASTY;  Surgeon: Salvatore Marvelobert Wainer, MD;  [facility];  Service: Orthopedics;  Laterality: Right;   TUBAL LIGATION      OB History   No obstetric history on file.      Home Medications    Prior to Admission medications   Medication Sig Start Date End Date Taking? Authorizing Provider  buPROPion (WELLBUTRIN XL) 150  MG 24 hr tablet Take 1 tablet (150 mg total) by mouth daily. 09/17/19  Yes Glori Luis, MD  escitalopram (LEXAPRO) 20 MG tablet Take 1 tablet by mouth once daily 11/08/19  Yes Glori Luis, MD  Multiple Vitamin (MULTIVITAMIN) capsule Take 1 capsule by mouth daily.   Yes [provider]  ipratropium (ATROVENT) 0.06 % nasal spray USE 2 SPRAY(S) IN EACH NOSTRIL 3 TO 4 TIMES DAILY AS NEEDED FOR ALLERGIES 03/15/18   [provider]  promethazine (PHENERGAN) 25 MG suppository Place 1 suppository (25 mg  total) rectally every 6 (six) hours as needed for nausea. Patient not taking: Reported on 08/10/2018 02/23/18 11/16/19  Loleta Rose, MD    Family History Family History  Problem Relation Age of Onset   Alcohol abuse Mother    Drug abuse Mother    CAD Mother    Arthritis Maternal Grandmother    Heart disease Maternal Grandmother    Stroke Maternal Grandmother    Hypertension Maternal Grandmother    Kidney disease Maternal Grandmother    Diabetes Maternal Grandmother    Anxiety disorder Brother    Hypertension Father     Social History Social History   Tobacco Use   Smoking status: Never Smoker   Smokeless tobacco: Never Used  Building services engineer Use: Never used  Substance Use Topics   Alcohol use: Not Currently   Drug use: No     Allergies   Patient has no known allergies.   Review of Systems Review of Systems  Constitutional: Positive for appetite change and fatigue. Negative for chills, diaphoresis and fever.  HENT: Positive for postnasal drip and rhinorrhea. Negative for congestion, ear discharge, ear pain and sore throat.   Eyes: Negative for discharge.  Respiratory: Negative for chest tightness and shortness of breath.   Cardiovascular: Negative for chest pain.  Gastrointestinal: Negative for abdominal pain, diarrhea, nausea and vomiting.  Musculoskeletal: Negative for arthralgias, gait problem and myalgias.  Skin: Negative for rash.  Hematological: Negative for adenopathy.     Physical Exam Triage Vital Signs ED Triage Vitals  Enc Vitals Group     BP 11/16/19 1117 (!) 135/95     Pulse Rate 11/16/19 1117 76     Resp 11/16/19 1117 14     Temp 11/16/19 1117 98.5 F (36.9 C)     Temp Source 11/16/19 1117 Oral     SpO2 11/16/19 1117 95 %     Weight 11/16/19 1115 215 lb (97.5 kg)     Height 11/16/19 1115 5\' 5"  (1.651 m)     Head Circumference --      Peak Flow --      Pain Score 11/16/19 1115 5     Pain Loc --      Pain Edu? --       Excl. in GC? --    No data found.  Updated Vital Signs BP (!) 135/95 (BP Location: Left Arm)    Pulse 76    Temp 98.5 F (36.9 C) (Oral)    Resp 14    Ht 5\' 5"  (1.651 m)    Wt 215 lb (97.5 kg)    SpO2 95%    BMI 35.78 kg/m  Repeated pulse ox  Repeated at discharge without the mask and was 96% Visual Acuity Right Eye Distance:   Left Eye Distance:   Bilateral Distance:    Right Eye Near:   Left Eye Near:    Bilateral Near:  Physical Exam Physical Exam Vitals signs and nursing note reviewed.  Constitutional:      General: She is not in acute distress.    Appearance: Normal appearance. She is not ill-appearing, toxic-appearing or diaphoretic.  HENT:     Head: Normocephalic.     Right Ear: Tympanic membrane, ear canal and external ear normal.     Left Ear: Tympanic membrane, ear canal and external ear normal.     Nose: Nose normal.     Mouth/Throat:     Mouth: Mucous membranes are moist.  Eyes:     General: No scleral icterus.       Right eye: No discharge.        Left eye: No discharge.     Conjunctiva/sclera: Conjunctivae normal.  Neck:     Musculoskeletal: Neck supple. No neck rigidity.  Cardiovascular:     Rate and Rhythm: Normal rate and regular rhythm.     Heart sounds: No murmur.  Pulmonary:     Effort: Pulmonary effort is normal.     Breath sounds: Normal breath sounds.   Musculoskeletal: Normal range of motion.  Lymphadenopathy:     Cervical: No cervical adenopathy.  Skin:    General: Skin is warm and dry.     Coloration: Skin is not jaundiced.     Findings: No rash.  Neurological:     Mental Status: She is alert and oriented to person, place, and time.     Gait: Gait normal.  Psychiatric:        Mood and Affect: Mood normal.        Behavior: Behavior normal.        Thought Content: Thought content normal.        Judgment: Judgment normal.     UC Treatments / Results  Labs (all labs ordered are listed, but only abnormal results are  displayed) Labs Reviewed  SARS CORONAVIRUS 2 (TAT 6-24 HRS)    EKG   Radiology No results found.  Procedures Procedures (including critical care time)  Medications Ordered in UC Medications - No data to display  Initial Impression / Assessment and Plan / UC Course  I have reviewed the triage vital signs and the nursing notes. I suspect she has covid. See instructions.  Final Clinical Impressions(s) / UC Diagnoses   Final diagnoses:  Suspected COVID-19 virus infection     Discharge Instructions     If your Covid test ends up positive you may take the following supplements to help your immune system be stronger to fight this viral infection Take Quarcetin 500 mg three times a day x 7 days with Zinc 50 mg ones a day x 7 days. The quarcetin is an antiviral and anti-inflammatory supplement which helps open the zinc channels in the cell to absorb Zinc. Zinc helps decrease the virus load in your body. Take Melatonin 6-10 mg at bed time which also helps support your immune system.  Also make sure to take Vit D 5,000 IU per day with a fatty meal and Vit C 1000 mg a day until you are completely better. Stay on Vitamin D 2,000  and C the rest of the season.  Don't lay around, keep active and walk as much as you are able to to prevent worsening of your symptoms.  Follow up with your family Dr next week.  If you get short of breath and you are able to check  your oxygen with a pulse oxygen meter, if it gets to  92% or less, you need to go to the hospital to be admitted. If you dont have one, come back here and we will assess you.     ED Prescriptions    None     PDMP not reviewed this encounter.   Garey Ham, PA-C 11/16/19 1152

## 2019-11-16 NOTE — ED Triage Notes (Signed)
Patient c/o dry cough, post nasal drip, head congestion, and HAs that started on Wed.  Patient denies fevers.

## 2019-11-16 NOTE — Discharge Instructions (Signed)
If your Covid test ends up positive you may take the following supplements to help your immune system be stronger to fight this viral infection Take Quarcetin 500 mg three times a day x 7 days with Zinc 50 mg ones a day x 7 days. The quarcetin is an antiviral and anti-inflammatory supplement which helps open the zinc channels in the cell to absorb Zinc. Zinc helps decrease the virus load in your body. Take Melatonin 6-10 mg at bed time which also helps support your immune system.  Also make sure to take Vit D 5,000 IU per day with a fatty meal and Vit C 1000 mg a day until you are completely better. Stay on Vitamin D 2,000  and C the rest of the season.  Don't lay around, keep active and walk as much as you are able to to prevent worsening of your symptoms.  Follow up with your family Dr next week.  If you get short of breath and you are able to check  your oxygen with a pulse oxygen meter, if it gets to 92% or less, you need to go to the hospital to be admitted. If you dont have one, come back here and we will assess you.   

## 2019-11-17 LAB — SARS CORONAVIRUS 2 (TAT 6-24 HRS): SARS Coronavirus 2: POSITIVE — AB

## 2019-11-18 ENCOUNTER — Other Ambulatory Visit (HOSPITAL_COMMUNITY): Payer: Self-pay | Admitting: Adult Health

## 2019-11-18 ENCOUNTER — Encounter: Payer: Self-pay | Admitting: Adult Health

## 2019-11-18 ENCOUNTER — Ambulatory Visit (HOSPITAL_COMMUNITY)
Admission: RE | Admit: 2019-11-18 | Discharge: 2019-11-18 | Disposition: A | Discharge status: Home / Self Care | Payer: Managed Care, Other (non HMO) | Source: Ambulatory Visit | Attending: Pulmonary Disease | Admitting: Pulmonary Disease

## 2019-11-18 DIAGNOSIS — U071 COVID-19: Secondary | ICD-10-CM

## 2019-11-18 MED ORDER — METHYLPREDNISOLONE SODIUM SUCC 125 MG IJ SOLR: 125.0000 mg | Freq: Once | INTRAMUSCULAR | Status: DC | PRN

## 2019-11-18 MED ORDER — ALBUTEROL SULFATE HFA 108 (90 BASE) MCG/ACT IN AERS: 2.0000 | INHALATION_SPRAY | Freq: Once | RESPIRATORY_TRACT | Status: DC | PRN

## 2019-11-18 MED ORDER — DIPHENHYDRAMINE HCL 50 MG/ML IJ SOLN: 50.0000 mg | Freq: Once | INTRAMUSCULAR | Status: DC | PRN

## 2019-11-18 MED ORDER — FAMOTIDINE IN NACL 20-0.9 MG/50ML-% IV SOLN: 20.0000 mg | Freq: Once | INTRAVENOUS | Status: DC | PRN

## 2019-11-18 MED ORDER — EPINEPHRINE 0.3 MG/0.3ML IJ SOAJ: 0.3000 mg | Freq: Once | INTRAMUSCULAR | Status: DC | PRN

## 2019-11-18 MED ORDER — SODIUM CHLORIDE 0.9 % IV SOLN
1200.0000 mg | Freq: Once | INTRAVENOUS | Status: AC
  Administered 2019-11-18: 1200 mg via INTRAVENOUS
  Filled 2019-11-18: qty 10

## 2019-11-18 MED ORDER — SODIUM CHLORIDE 0.9 % IV SOLN: INTRAVENOUS | Status: DC | PRN

## 2019-11-18 NOTE — Progress Notes (Signed)
  Diagnosis: COVID-19  Physician: Dr.  Patrick Wright  Procedure: Covid Infusion Clinic Med: casirivimab\imdevimab infusion - Provided patient with casirivimab\imdevimab fact sheet for patients, parents and caregivers prior to infusion.  Complications: No immediate complications noted.  Discharge: Discharged home   Ally Yow 11/18/2019   

## 2019-11-18 NOTE — Discharge Instructions (Signed)

## 2019-11-18 NOTE — Progress Notes (Signed)
I connected by phone with Anne James on 11/18/2019 at 9:30 AM to discuss the potential use of a new treatment for mild to moderate COVID-19 viral infection in non-hospitalized patients.  This patient is a 49 y.o. female that meets the FDA criteria for Emergency Use Authorization of COVID monoclonal antibody casirivimab/imdevimab.  Has a (+) direct SARS-CoV-2 viral test result  Has mild or moderate COVID-19   Is NOT hospitalized due to COVID-19  Is within 10 days of symptom onset  Has at least one of the high risk factor(s) for progression to severe COVID-19 and/or hospitalization as defined in EUA.  Specific high risk criteria : BMI > 25   I have spoken and communicated the following to the patient or parent/caregiver regarding COVID monoclonal antibody treatment:  1. FDA has authorized the emergency use for the treatment of mild to moderate COVID-19 in adults and pediatric patients with positive results of direct SARS-CoV-2 viral testing who are 25 years of age and older weighing at least 40 kg, and who are at high risk for progressing to severe COVID-19 and/or hospitalization.  2. The significant known and potential risks and benefits of COVID monoclonal antibody, and the extent to which such potential risks and benefits are unknown.  3. Information on available alternative treatments and the risks and benefits of those alternatives, including clinical trials.  4. Patients treated with COVID monoclonal antibody should continue to self-isolate and use infection control measures (e.g., wear mask, isolate, social distance, avoid sharing personal items, clean and disinfect "high touch" surfaces, and frequent handwashing) according to CDC guidelines.   5. The patient or parent/caregiver has the option to accept or refuse COVID monoclonal antibody treatment.  After reviewing this information with the patient, The patient agreed to proceed with receiving casirivimab\imdevimab infusion and  will be provided a copy of the Fact sheet prior to receiving the infusion. Noreene Filbert 11/18/2019 9:30 AM

## 2019-11-22 ENCOUNTER — Encounter: Payer: Self-pay | Admitting: Family Medicine

## 2019-12-03 ENCOUNTER — Encounter: Payer: Self-pay | Admitting: Family Medicine

## 2019-12-06 ENCOUNTER — Other Ambulatory Visit: Payer: Self-pay | Admitting: Family Medicine

## 2019-12-11 ENCOUNTER — Encounter: Payer: Self-pay | Admitting: Family Medicine

## 2019-12-11 ENCOUNTER — Other Ambulatory Visit: Payer: Self-pay

## 2019-12-11 ENCOUNTER — Telehealth (INDEPENDENT_AMBULATORY_CARE_PROVIDER_SITE_OTHER): Payer: Managed Care, Other (non HMO) | Admitting: Family Medicine

## 2019-12-11 DIAGNOSIS — Z8616 Personal history of COVID-19: Secondary | ICD-10-CM | POA: Diagnosis not present

## 2019-12-11 DIAGNOSIS — E669 Obesity, unspecified: Secondary | ICD-10-CM

## 2019-12-11 DIAGNOSIS — M170 Bilateral primary osteoarthritis of knee: Secondary | ICD-10-CM

## 2019-12-11 MED ORDER — OMEPRAZOLE 20 MG PO CPDR: 20.0000 mg | DELAYED_RELEASE_CAPSULE | Freq: Every day | ORAL | 3 refills | Status: DC

## 2019-12-11 MED ORDER — CELECOXIB 50 MG PO CAPS: 50.0000 mg | ORAL_CAPSULE | Freq: Two times a day (BID) | ORAL | 1 refills | Status: DC | PRN

## 2019-12-11 NOTE — Progress Notes (Signed)
Virtual Visit via video Note  This visit type was conducted due to national recommendations for restrictions regarding the COVID-19 pandemic (e.g. social distancing).  This format is felt to be most appropriate for this patient at this time.  All issues noted in this document were discussed and addressed.  No physical exam was performed (except for noted visual exam findings with Video Visits).   I connected with Anne James today at  8:30 AM EDT by a video enabled telemedicine application or telephone and verified that I am speaking with the correct person using two identifiers. Location patient: home Location provider: work Persons participating in the virtual visit: patient, provider  I discussed the limitations, risks, security and privacy concerns of performing an evaluation and management service by telephone and the availability of in person appointments. I also discussed with the patient that there may be a patient responsible charge related to this service. The patient expressed understanding and agreed to proceed.   Reason for visit: Follow-up.  HPI: History of COVID-19: Patient notes symptoms started 9/1.  She received the monoclonal antibody infusion notes that was beneficial.  She notes she feels quite a bit better.  Still has a small cough.  Energy level is not quite where it used to be.  She does note a little bit of fogginess with her memory.  She did lose about 10 pounds due to loss of taste that her taste has returned.  Obesity: Patient notes she is drinking mostly water.  Limiting diet Cokes.  Trying to eat as healthfully as possible.  She is not exercising over the last month given her COVID-19 diagnosis.  She plans to start walking.  Bilateral knee pain: Osteoarthritis related.  She was previously on Celebrex and notes that was quite beneficial.  She had been on this after her gastric bypass.  She wonders if she can restart this for bilateral knee pain.   ROS: See  pertinent positives and negatives per HPI.  Past Medical History:  Diagnosis Date  . Anemia    low iron  . Anxiety   . 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 Chicken pox   . Depression   . Eating disorder   . GERD (gastroesophageal reflux disease)   . History of endometrial ablation 09/17/2016   In 2006. No periods since 2006.  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 Hypertension   . Phlebitis   . Primary localized osteoarthritis of left knee   . Primary localized osteoarthritis of right knee 01/06/2016  . 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 CHOLECYSTECTOMY N/A 09/18/2016   Procedure: LAPAROSCOPIC CHOLECYSTECTOMY;  Surgeon: Leafy Ro, MD;  Location: [facility];  Service: General;  Laterality: N/A;  . EPIGASTRIC HERNIA REPAIR  02/02/2018   Procedure: INTERNAL HERNIA  REDUCTION;  Surgeon: Leafy Ro, MD;  Location: [facility];  Service: General;;  . ESOPHAGOGASTRODUODENOSCOPY (EGD) WITH PROPOFOL N/A 02/02/2018   Procedure: ESOPHAGOGASTRODUODENOSCOPY (EGD) WITH PROPOFOL;  Surgeon: Toledo, Boykin Nearing, MD;  Location: [facility];  Service: Gastroenterology;  Laterality: N/A;  . GASTRIC BYPASS  2002   [facility], Dr. Kennedy Bucker  . LAPAROSCOPY N/A 02/02/2018   Procedure: LAPAROSCOPY DIAGNOSTIC CONVERTED TO OPEN;  Surgeon: Leafy Ro, MD;  Location: [facility];  Service: General;  Laterality: N/A;  . NOVASURE ABLATION    .  TOTAL KNEE ARTHROPLASTY Left 06/29/2015   Procedure: LEFT TOTAL KNEE ARTHROPLASTY;  Surgeon: Salvatore Marvel, MD;  [facility];  Service: Orthopedics;  Laterality: Left;  . TOTAL KNEE ARTHROPLASTY Right 01/18/2016   Procedure: TOTAL KNEE ARTHROPLASTY;  Surgeon: Salvatore Marvel, MD;  Location: [facility] OR;  Service: Orthopedics;  Laterality: Right;  . TUBAL LIGATION      Family History  Problem Relation Age of  Onset  . Alcohol abuse Mother   . Drug abuse Mother   . CAD Mother   . Arthritis Maternal Grandmother   . Heart disease Maternal Grandmother   . Stroke Maternal Grandmother   . Hypertension Maternal Grandmother   . Kidney disease Maternal Grandmother   . Diabetes Maternal Grandmother   . Anxiety disorder Brother   . Hypertension Father     SOCIAL HX: Non-smoker   Current Outpatient Medications:  .  buPROPion (WELLBUTRIN XL) 150 MG 24 hr tablet, Take 1 tablet by mouth once daily, Disp: 30 tablet, Rfl: 0 .  escitalopram (LEXAPRO) 20 MG tablet, Take 1 tablet by mouth once daily, Disp: 30 tablet, Rfl: 0 .  ipratropium (ATROVENT) 0.06 % nasal spray, USE 2 SPRAY(S) IN EACH NOSTRIL 3 TO 4 TIMES DAILY AS NEEDED FOR ALLERGIES, Disp: , Rfl:  .  Multiple Vitamin (MULTIVITAMIN) capsule, Take 1 capsule by mouth daily., Disp: , Rfl:  .  celecoxib (CELEBREX) 50 MG capsule, Take 1 capsule (50 mg total) by mouth 2 (two) times daily as needed for pain., Disp: 60 capsule, Rfl: 1 .  omeprazole (PRILOSEC) 20 MG capsule, Take 1 capsule (20 mg total) by mouth daily., Disp: 30 capsule, Rfl: 3  EXAM:  VITALS per patient if applicable:  GENERAL: alert, oriented, appears well and in no acute distress  HEENT: atraumatic, conjunttiva clear, no obvious abnormalities on inspection of external nose and ears  NECK: normal movements of the head and neck  LUNGS: on inspection no signs of respiratory distress, breathing rate appears normal, no obvious gross SOB, gasping or wheezing  CV: no obvious cyanosis  MS: moves all visible extremities without noticeable abnormality  PSYCH/NEURO: pleasant and cooperative, no obvious depression or anxiety, speech and thought processing grossly intact  ASSESSMENT AND PLAN:  Discussed the following assessment and plan:  History of COVID-19 Patient is progressively recovering.  Discussed that it could take a number of weeks before her cough would completely resolve.   Advised to monitor her symptoms and if they do not continue to improve letting us know and we can have her go to the post Covid care clinic.  Obesity (BMI 30-39.9) Encouraged building in some walking as exercise as she starts to feel better after her Covid diagnosis.  She will continue to monitor her diet.  Osteoarthritis of both knees Continues to have issues with knee pain.  Celebrex in the past was beneficial.  I think it is reasonable to give her a trial of this and place her on a PPI for gastric protection.  Discussed the risk of ulceration and stomach irritation with the Celebrex.  Advised if this occurs she needs to let us know right away and discontinue the Celebrex.   No orders of the defined types were placed in this encounter.   Meds ordered this encounter  Medications  . celecoxib (CELEBREX) 50 MG capsule    Sig: Take 1 capsule (50 mg total) by mouth 2 (two) times daily as needed for pain.    Dispense:  60 capsule    Refill:  1  . omeprazole (PRILOSEC) 20 MG capsule    Sig: Take 1 capsule (20 mg total) by mouth daily.    Dispense:  30 capsule    Refill:  3     I discussed the assessment and treatment plan with the patient. The patient was provided an opportunity to ask questions and all were answered. The patient agreed with the plan and demonstrated an understanding of the instructions.   The patient was advised to call back or seek an in-person evaluation if the symptoms worsen or if the condition fails to improve as anticipated.   Marikay Alar, MD

## 2019-12-12 ENCOUNTER — Ambulatory Visit
Admission: RE | Admit: 2019-12-12 | Discharge: 2019-12-12 | Disposition: A | Discharge status: Home / Self Care | Payer: Managed Care, Other (non HMO) | Source: Ambulatory Visit | Attending: Family Medicine | Admitting: Family Medicine

## 2019-12-12 DIAGNOSIS — Z8616 Personal history of COVID-19: Secondary | ICD-10-CM | POA: Insufficient documentation

## 2019-12-12 DIAGNOSIS — Z1231 Encounter for screening mammogram for malignant neoplasm of breast: Secondary | ICD-10-CM | POA: Insufficient documentation

## 2019-12-12 NOTE — Assessment & Plan Note (Signed)
Continues to have issues with knee pain.  Celebrex in the past was beneficial.  I think it is reasonable to give her a trial of this and place her on a PPI for gastric protection.  Discussed the risk of ulceration and stomach irritation with the Celebrex.  Advised if this occurs she needs to let us know right away and discontinue the Celebrex.

## 2019-12-12 NOTE — Assessment & Plan Note (Signed)
Patient is progressively recovering.  Discussed that it could take a number of weeks before her cough would completely resolve.  Advised to monitor her symptoms and if they do not continue to improve letting us know and we can have her go to the post Covid care clinic.

## 2019-12-12 NOTE — Assessment & Plan Note (Signed)
Encouraged building in some walking as exercise as she starts to feel better after her Covid diagnosis.  She will continue to monitor her diet.

## 2019-12-13 ENCOUNTER — Telehealth: Payer: Self-pay

## 2019-12-13 NOTE — Telephone Encounter (Signed)
Please make available to the patient to pick up.

## 2019-12-13 NOTE — Telephone Encounter (Signed)
Called and informed the patient that her wellness appeal form is ready for pickup. She understood.    Anne James,cma

## 2019-12-19 ENCOUNTER — Other Ambulatory Visit: Payer: Self-pay | Admitting: *Deleted

## 2019-12-19 ENCOUNTER — Other Ambulatory Visit: Payer: Self-pay | Admitting: Family Medicine

## 2019-12-19 ENCOUNTER — Inpatient Hospital Stay
Admission: RE | Admit: 2019-12-19 | Discharge: 2019-12-19 | Disposition: A | Discharge status: Home / Self Care | Payer: Self-pay | Source: Ambulatory Visit | Attending: *Deleted | Admitting: *Deleted

## 2019-12-19 DIAGNOSIS — Z1231 Encounter for screening mammogram for malignant neoplasm of breast: Secondary | ICD-10-CM

## 2019-12-19 DIAGNOSIS — N6489 Other specified disorders of breast: Secondary | ICD-10-CM

## 2019-12-19 DIAGNOSIS — R928 Other abnormal and inconclusive findings on diagnostic imaging of breast: Secondary | ICD-10-CM

## 2020-01-06 ENCOUNTER — Other Ambulatory Visit: Payer: Self-pay | Admitting: Family Medicine

## 2020-01-06 MED ORDER — BUPROPION HCL ER (XL) 150 MG PO TB24: 150.0000 mg | ORAL_TABLET | Freq: Every day | ORAL | 0 refills | Status: DC

## 2020-01-06 MED ORDER — ESCITALOPRAM OXALATE 20 MG PO TABS
20.0000 mg | ORAL_TABLET | Freq: Every day | ORAL | 0 refills | Status: DC
Start: 2020-01-06 — End: 2020-01-10

## 2020-01-10 ENCOUNTER — Encounter: Payer: Self-pay | Admitting: Family Medicine

## 2020-01-10 ENCOUNTER — Ambulatory Visit
Admission: RE | Admit: 2020-01-10 | Discharge: 2020-01-10 | Disposition: A | Discharge status: Home / Self Care | Payer: Managed Care, Other (non HMO) | Source: Ambulatory Visit | Attending: Family Medicine | Admitting: Family Medicine

## 2020-01-10 ENCOUNTER — Other Ambulatory Visit: Payer: Self-pay

## 2020-01-10 DIAGNOSIS — N6489 Other specified disorders of breast: Secondary | ICD-10-CM

## 2020-01-10 DIAGNOSIS — R928 Other abnormal and inconclusive findings on diagnostic imaging of breast: Secondary | ICD-10-CM

## 2020-01-10 MED ORDER — ESCITALOPRAM OXALATE 20 MG PO TABS
20.0000 mg | ORAL_TABLET | Freq: Every day | ORAL | 0 refills | Status: DC
Start: 2020-01-10 — End: 2020-04-22

## 2020-01-10 MED ORDER — BUPROPION HCL ER (XL) 150 MG PO TB24
150.0000 mg | ORAL_TABLET | Freq: Every day | ORAL | 0 refills | Status: DC
Start: 2020-01-10 — End: 2020-04-22

## 2020-01-10 NOTE — Telephone Encounter (Signed)
Pt states that the pharmacy will not fill buPROPion (WELLBUTRIN XL) 150 MG 24 hr tablet or escitalopram (LEXAPRO) 20 MG tablet unless they are 90 day supply-please send as 90 for both

## 2020-01-17 ENCOUNTER — Other Ambulatory Visit: Payer: Self-pay | Admitting: Family Medicine

## 2020-02-18 ENCOUNTER — Other Ambulatory Visit: Payer: Self-pay

## 2020-02-18 DIAGNOSIS — K219 Gastro-esophageal reflux disease without esophagitis: Secondary | ICD-10-CM

## 2020-02-18 MED ORDER — OMEPRAZOLE 20 MG PO CPDR: 20.0000 mg | DELAYED_RELEASE_CAPSULE | Freq: Every day | ORAL | 3 refills | Status: DC

## 2020-02-21 ENCOUNTER — Other Ambulatory Visit: Payer: Self-pay | Admitting: Family Medicine

## 2020-02-21 DIAGNOSIS — K219 Gastro-esophageal reflux disease without esophagitis: Secondary | ICD-10-CM

## 2020-02-21 MED ORDER — CELECOXIB 50 MG PO CAPS
ORAL_CAPSULE | ORAL | 0 refills | Status: DC
Start: 2020-02-21 — End: 2020-06-22

## 2020-02-21 MED ORDER — OMEPRAZOLE 20 MG PO CPDR: 20.0000 mg | DELAYED_RELEASE_CAPSULE | Freq: Every day | ORAL | 1 refills | Status: DC

## 2020-04-20 NOTE — Progress Notes (Signed)
  [facility]  [phone]) [phone] [phone]) [phone]  ID: Madelyn Flavors OB: Aug 07, 1970  MR#: [id number]  MEQ#:[id number]  Patient Care Team: Glori Luis, MD as PCP - General (Family Medicine) Jeralyn Ruths, MD as Consulting Physician (Hematology and Oncology)   Jeralyn Ruths, MD   04/21/2020 2:53 PM     This encounter was created in error - please disregard.

## 2020-04-21 ENCOUNTER — Encounter: Payer: Self-pay | Admitting: Oncology

## 2020-04-21 ENCOUNTER — Inpatient Hospital Stay: Payer: Managed Care, Other (non HMO) | Admitting: Oncology

## 2020-04-21 DIAGNOSIS — D508 Other iron deficiency anemias: Secondary | ICD-10-CM

## 2020-04-22 ENCOUNTER — Encounter: Payer: Self-pay | Admitting: Family Medicine

## 2020-04-22 MED ORDER — BUPROPION HCL ER (XL) 150 MG PO TB24
150.0000 mg | ORAL_TABLET | Freq: Every day | ORAL | 1 refills | Status: DC
Start: 2020-04-22 — End: 2020-04-24

## 2020-04-22 MED ORDER — ESCITALOPRAM OXALATE 20 MG PO TABS: 20.0000 mg | ORAL_TABLET | Freq: Every day | ORAL | 1 refills | Status: DC

## 2020-04-24 ENCOUNTER — Other Ambulatory Visit: Payer: Self-pay | Admitting: Family Medicine

## 2020-06-21 ENCOUNTER — Other Ambulatory Visit: Payer: Self-pay | Admitting: Family Medicine

## 2020-06-24 ENCOUNTER — Telehealth: Payer: Self-pay | Admitting: Family Medicine

## 2020-06-24 NOTE — Telephone Encounter (Signed)
Please triage   ===View-only below this line=== ----- Message ----- From: Madelyn Flavors Sent: 06/24/2020  10:16 AM EDT To: Dary[facility] November Admin Pool Subject: Appointment Request                            Appointment Request From: Madelyn Flavors  With Provider: Marikay Alar, MD [facility] [place]]  Preferred Date Range: Any  Preferred Times: Any Time  Reason for visit: Office Visit  Comments: I am pretty sure that the gastritis has flared back up, yesterday vomiting and diarrhea . Today burping and diarrhea, I feel like I have been hit by a truck. Would like an appointment to discuss and treat. Thank you!

## 2020-06-24 NOTE — Telephone Encounter (Signed)
Spoken to patient, her sx have gotten little better but she believes she is having a bout of gastritis due to her hx.  She is currently having more diarrhea than usual and previously she was having NVD. Currently only sx is diarrhea and belching, no nausea, vomiting, heart palpitations no blood in stool, and no back px. She has been staying hydrated and did not wish to go to UC/ED. She would like to see Dr Birdie Sons. Appointment has been scheduled next week with pcp.

## 2020-06-24 NOTE — Telephone Encounter (Signed)
Noted. If she has any worsening of her symptoms she should go to urgent care for evaluation.

## 2020-07-01 ENCOUNTER — Telehealth (INDEPENDENT_AMBULATORY_CARE_PROVIDER_SITE_OTHER): Payer: Managed Care, Other (non HMO) | Admitting: Family Medicine

## 2020-07-01 ENCOUNTER — Encounter: Payer: Self-pay | Admitting: Family Medicine

## 2020-07-01 ENCOUNTER — Other Ambulatory Visit: Payer: Self-pay

## 2020-07-01 DIAGNOSIS — K529 Noninfective gastroenteritis and colitis, unspecified: Secondary | ICD-10-CM | POA: Insufficient documentation

## 2020-07-01 DIAGNOSIS — F32A Depression, unspecified: Secondary | ICD-10-CM | POA: Diagnosis not present

## 2020-07-01 DIAGNOSIS — E669 Obesity, unspecified: Secondary | ICD-10-CM | POA: Diagnosis not present

## 2020-07-01 DIAGNOSIS — F419 Anxiety disorder, unspecified: Secondary | ICD-10-CM | POA: Diagnosis not present

## 2020-07-01 NOTE — Assessment & Plan Note (Signed)
Asymptomatic.  She will continue Wellbutrin 150 mg once daily and Lexapro 20 mg daily.

## 2020-07-01 NOTE — Progress Notes (Signed)
Virtual Visit via video Note  This visit type was conducted due to national recommendations for restrictions regarding the COVID-19 pandemic (e.g. social distancing).  This format is felt to be most appropriate for this patient at this time.  All issues noted in this document were discussed and addressed.  No physical exam was performed (except for noted visual exam findings with Video Visits).   I connected with Anne James today at  3:15 PM EDT by a video enabled telemedicine application or telephone and verified that I am speaking with the correct person using two identifiers. Location patient: home Location provider: work Persons participating in the virtual visit: patient, provider  I discussed the limitations, risks, security and privacy concerns of performing an evaluation and management service by telephone and the availability of in person appointments. I also discussed with the patient that there may be a patient responsible charge related to this service. The patient expressed understanding and agreed to proceed.  Reason for visit: f/u.  HPI: Anxiety/depression: Denies anxiety and depression.  No SI.  She feels the Wellbutrin and Lexapro have been helpful.  Diarrhea/vomiting: Patient notes onset of symptoms last Tuesday.  Started with abdominal pain in her epigastric region.  This was followed by vomiting and diarrhea.  She had some nausea.  No blood in her stool or vomitus.  Notes it had a hard time easing up so she went to an urgent care and was given fluids due to dehydration.  They also gave her Zofran.  She had a negative home COVID test.  She notes the diarrhea stopped 3 days ago and she is now feeling better.  She has been eating bland foods and trying to stay as hydrated as possible.  Obesity: Patient notes she joined weight watchers 3 weeks ago.  She lost about 11 pounds with her recent GI illness.  She is going to try to start walking for exercise.   ROS: See pertinent  positives and negatives per HPI.  Past Medical History:  Diagnosis Date  . Anemia    low iron  . Anxiety   . 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 Chicken pox   . Depression   . Eating disorder   . GERD (gastroesophageal reflux disease)   . History of endometrial ablation 09/17/2016   In 2006. No periods since 2006.  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 Hypertension   . Intussusception of jejunum (HCC) 01/31/2018  . Phlebitis   . Primary localized osteoarthritis of left knee   . Primary localized osteoarthritis of right knee 01/06/2016  . 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 CHOLECYSTECTOMY N/A 09/18/2016   Procedure: LAPAROSCOPIC CHOLECYSTECTOMY;  Surgeon: Leafy Ro, MD;  Location: [facility]S;  Service: General;  Laterality: N/A;  . EPIGASTRIC HERNIA REPAIR  02/02/2018   Procedure: INTERNAL HERNIA  REDUCTION;  Surgeon: Leafy Ro, MD;  Location: [facility];  Service: General;;  . ESOPHAGOGASTRODUODENOSCOPY (EGD) WITH PROPOFOL N/A 02/02/2018   Procedure: ESOPHAGOGASTRODUODENOSCOPY (EGD) WITH PROPOFOL;  Surgeon: Toledo, Boykin Nearing, MD;  Location: [facility];  Service: Gastroenterology;  Laterality: N/A;  . GASTRIC BYPASS  2002   [facility], Dr. Kennedy Bucker  . LAPAROSCOPY N/A 02/02/2018   Procedure: LAPAROSCOPY DIAGNOSTIC CONVERTED TO OPEN;  Surgeon: Leafy Ro, MD;  Location: [facility];  Service: General;  Laterality: N/A;  . NOVASURE ABLATION    . TOTAL KNEE ARTHROPLASTY Left 06/29/2015   Procedure: LEFT TOTAL KNEE ARTHROPLASTY;  Surgeon: Salvatore Marvel, MD;  Location: [facility] OR;  Service: Orthopedics;  Laterality: Left;  . TOTAL KNEE ARTHROPLASTY Right 01/18/2016   Procedure: TOTAL KNEE ARTHROPLASTY;  Surgeon: Salvatore Marvel, MD;  Location: [facility] OR;  Service: Orthopedics;  Laterality: Right;  . TUBAL LIGATION      Family  History  Problem Relation Age of Onset  . Alcohol abuse Mother   . Drug abuse Mother   . CAD Mother   . Arthritis Maternal Grandmother   . Heart disease Maternal Grandmother   . Stroke Maternal Grandmother   . Hypertension Maternal Grandmother   . Kidney disease Maternal Grandmother   . Diabetes Maternal Grandmother   . Anxiety disorder Brother   . Hypertension Father     SOCIAL HX: Non-smoker   Current Outpatient Medications:  .  buPROPion (WELLBUTRIN XL) 150 MG 24 hr tablet, TAKE 1 TABLET(150 MG) BY MOUTH DAILY, Disp: 90 tablet, Rfl: 1 .  celecoxib (CELEBREX) 50 MG capsule, TAKE 1 CAPSULE(50 MG) BY MOUTH TWICE DAILY AS NEEDED FOR PAIN, Disp: 180 capsule, Rfl: 0 .  escitalopram (LEXAPRO) 20 MG tablet, TAKE 1 TABLET(20 MG) BY MOUTH DAILY, Disp: 90 tablet, Rfl: 1 .  ipratropium (ATROVENT) 0.06 % nasal spray, USE 2 SPRAY(S) IN EACH NOSTRIL 3 TO 4 TIMES DAILY AS NEEDED FOR ALLERGIES, Disp: , Rfl:  .  Multiple Vitamin (MULTIVITAMIN) capsule, Take 1 capsule by mouth daily., Disp: , Rfl:  .  omeprazole (PRILOSEC) 20 MG capsule, Take 1 capsule (20 mg total) by mouth daily., Disp: 90 capsule, Rfl: 1  EXAM:  VITALS per patient if applicable:  GENERAL: alert, oriented, appears well and in no acute distress  HEENT: atraumatic, conjunttiva clear, no obvious abnormalities on inspection of external nose and ears  NECK: normal movements of the head and neck  LUNGS: on inspection no signs of respiratory distress, breathing rate appears normal, no obvious gross SOB, gasping or wheezing  CV: no obvious cyanosis  MS: moves all visible extremities without noticeable abnormality  PSYCH/NEURO: pleasant and cooperative, no obvious depression or anxiety, speech and thought processing grossly intact  ASSESSMENT AND PLAN:  Discussed the following assessment and plan:  Problem List Items Addressed This Visit    Obesity (BMI 30-39.9)    Encouraged her to continue with weight watchers as this  seems to be quite beneficial for most patients.  Discussed adding in some walking for exercise.      Anxiety and depression    Asymptomatic.  She will continue Wellbutrin 150 mg once daily and Lexapro 20 mg daily.      Gastroenteritis    I suspect this was related to a virus.  She has improved at this time.  She will monitor for recurrence of symptoms.          I discussed the assessment and treatment plan with the patient. The patient was provided an opportunity to ask questions and all were answered. The patient agreed with the plan and demonstrated an understanding of the instructions.   The patient was advised to call back or seek an in-person evaluation if the symptoms worsen or if the condition fails to improve as anticipated.   Marikay Alar, MD

## 2020-07-01 NOTE — Assessment & Plan Note (Signed)
I suspect this was related to a virus.  She has improved at this time.  She will monitor for recurrence of symptoms.

## 2020-07-01 NOTE — Assessment & Plan Note (Signed)
Encouraged her to continue with weight watchers as this seems to be quite beneficial for most patients.  Discussed adding in some walking for exercise.

## 2020-11-04 ENCOUNTER — Other Ambulatory Visit: Payer: Self-pay | Admitting: Family Medicine

## 2020-11-04 DIAGNOSIS — K219 Gastro-esophageal reflux disease without esophagitis: Secondary | ICD-10-CM

## 2020-11-05 ENCOUNTER — Other Ambulatory Visit: Payer: Self-pay | Admitting: Family Medicine

## 2020-11-06 ENCOUNTER — Telehealth: Payer: Self-pay

## 2020-11-06 MED ORDER — BUPROPION HCL ER (XL) 150 MG PO TB24: ORAL_TABLET | ORAL | 1 refills | Status: DC

## 2020-11-06 NOTE — Telephone Encounter (Signed)
She needs follow-up scheduled.  I did send a refill in for her.  Please call her to get her scheduled.  Thanks.

## 2020-11-07 ENCOUNTER — Other Ambulatory Visit: Payer: Self-pay | Admitting: Family Medicine

## 2020-11-09 NOTE — Telephone Encounter (Signed)
Patient was called and is scheduled.  Miyana Mordecai,cma

## 2020-12-04 ENCOUNTER — Ambulatory Visit: Payer: Managed Care, Other (non HMO) | Admitting: Family Medicine

## 2020-12-04 ENCOUNTER — Other Ambulatory Visit: Payer: Self-pay | Admitting: Family Medicine

## 2021-01-02 ENCOUNTER — Other Ambulatory Visit: Payer: Self-pay | Admitting: Family Medicine

## 2021-01-28 ENCOUNTER — Other Ambulatory Visit: Payer: Self-pay

## 2021-01-28 ENCOUNTER — Ambulatory Visit
Admission: RE | Admit: 2021-01-28 | Discharge: 2021-01-28 | Disposition: A | Discharge status: Home / Self Care | Payer: Managed Care, Other (non HMO) | Source: Ambulatory Visit | Attending: Internal Medicine | Admitting: Internal Medicine

## 2021-01-28 VITALS — BP 177/96 | HR 70 | Temp 98.1°F | Resp 18

## 2021-01-28 DIAGNOSIS — M654 Radial styloid tenosynovitis [de Quervain]: Secondary | ICD-10-CM

## 2021-01-28 NOTE — ED Triage Notes (Signed)
Pt presents today with c/o right hand/wrist pain intermittently for three months, worse in past week. Denies injury.

## 2021-01-28 NOTE — ED Provider Notes (Signed)
[name]    CSN: [id number] Arrival date & time: 01/28/21  1735      History   Chief Complaint Chief Complaint  Patient presents with   Appt@6    Hand Pain    right    HPI Anne James is a 50 y.o. female who presents with R medial wrist pain, worse when using her thumb or picking up her grand kids x 3 months. First started after picking up a lot of files at work with both hands. Today noticed a lump on her palm. Denies paresthesia.     Past Medical History:  Diagnosis Date   Anemia    low iron   Anxiety    Arthritis    bilateral, right>left   Chicken pox    Depression    Eating disorder    GERD (gastroesophageal reflux disease)    History of endometrial ablation 09/17/2016   In 2006. No periods since 2006.   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    Patient Active Problem List   Diagnosis Date Noted   Gastroenteritis 07/01/2020   History of COVID-19 12/12/2019   Bilateral shoulder pain 05/21/2019   Intractable vomiting    H/O bariatric surgery 12/14/2016   Iron deficiency anemia secondary to inadequate dietary iron intake 12/14/2016   Hot flashes 09/12/2016   Status post right knee replacement 03/02/2016   Neck pain 11/30/2015   S/P total knee arthroplasty, 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   Anxiety and depression 06/08/2015   Chronic fatigue 07/02/2014   Encounter for general adult medical examination with abnormal findings 10/31/2012   Right hip pain 02/23/2012   Osteoarthritis of both knees 10/17/2011   Obesity (BMI 30-39.9) 10/17/2011   History of pulmonary embolus (PE) 03/14/2000    Past Surgical History:  Procedure Laterality Date    CHOLECYSTECTOMY N/A 09/18/2016   Procedure: LAPAROSCOPIC CHOLECYSTECTOMY;  Surgeon: Leafy Ro, MD;  Location: [facility];  Service: General;  Laterality: N/A;   EPIGASTRIC HERNIA REPAIR  02/02/2018   Procedure: INTERNAL HERNIA  REDUCTION;  Surgeon: Leafy Ro, MD;  Location: [facility];  Service: General;;   ESOPHAGOGASTRODUODENOSCOPY (EGD) WITH PROPOFOL N/A 02/02/2018   Procedure: ESOPHAGOGASTRODUODENOSCOPY (EGD) WITH PROPOFOL;  Surgeon: Toledo, Boykin Nearing, MD;  Location: [facility];  Service: Gastroenterology;  Laterality: N/A;   GASTRIC BYPASS  2002   [facility], Dr. Kennedy Bucker   LAPAROSCOPY N/A 02/02/2018   Procedure: LAPAROSCOPY DIAGNOSTIC CONVERTED TO OPEN;  Surgeon: Leafy Ro, MD;  Location: [facility];  Service: General;  Laterality: N/A;   NOVASURE ABLATION     TOTAL KNEE ARTHROPLASTY Left 06/29/2015   Procedure: LEFT TOTAL KNEE ARTHROPLASTY;  Surgeon: Salvatore Marvel, MD;  Location: [facility] OR;  Service: Orthopedics;  Laterality: Left;   TOTAL KNEE ARTHROPLASTY Right 01/18/2016   Procedure: TOTAL KNEE ARTHROPLASTY;  Surgeon: Salvatore Marvel, MD;  Location: [facility] OR;  Service: Orthopedics;  Laterality: Right;   TUBAL LIGATION      OB History   No obstetric history on file.      Home Medications    Prior to Admission medications   Medication Sig Start Date End Date Taking? Authorizing Provider  buPROPion (WELLBUTRIN XL) 150 MG 24 hr tablet TAKE 1 TABLET(150 MG) BY MOUTH DAILY 11/10/20   Glori Luis, MD  celecoxib (CELEBREX) 50 MG capsule TAKE 1 CAPSULE(50 MG) BY MOUTH TWICE DAILY AS NEEDED FOR PAIN 01/04/21   Glori Luis, MD  escitalopram (LEXAPRO) 20 MG tablet TAKE 1 TABLET(20 MG) BY MOUTH DAILY 11/10/20   Glori Luis, MD  ipratropium (ATROVENT) 0.06 % nasal spray USE 2 SPRAY(S) IN EACH NOSTRIL 3 TO 4 TIMES DAILY AS NEEDED FOR ALLERGIES 03/15/18   [provider]  Multiple Vitamin (MULTIVITAMIN) capsule Take 1 capsule by mouth daily.    [provider]  omeprazole (PRILOSEC) 20 MG capsule TAKE 1 CAPSULE(20 MG) BY MOUTH DAILY 11/05/20   Glori Luis, MD  promethazine (PHENERGAN) 25 MG suppository Place 1 suppository (25 mg total) rectally every 6 (six) hours as needed for nausea. Patient not taking: Reported on 08/10/2018 02/23/18 11/16/19  Loleta Rose, MD    Family History Family History  Problem Relation Age of Onset   Alcohol abuse Mother    Drug abuse Mother    CAD Mother    Arthritis Maternal Grandmother    Heart disease Maternal Grandmother    Stroke Maternal Grandmother    Hypertension Maternal Grandmother    Kidney disease Maternal Grandmother    Diabetes Maternal Grandmother    Anxiety disorder Brother    Hypertension Father     Social History Social History   Tobacco Use   Smoking status: Never   Smokeless tobacco: Never  Vaping Use   Vaping Use: Never used  Substance Use Topics   Alcohol use: Not Currently   Drug use: No     Allergies   Patient has no known allergies.   Review of Systems Review of Systems  Musculoskeletal:  Positive for arthralgias.  Skin:  Negative for color change, pallor, rash and wound.  Neurological:  Negative for weakness and numbness.    Physical Exam Triage Vital Signs ED Triage Vitals  Enc Vitals Group     BP 01/28/21 1800 (!) 162/111     Pulse Rate 01/28/21 1753 70     Resp 01/28/21 1751 18     Temp 01/28/21 1751 98.1 F (36.7 C)     Temp Source 01/28/21 1751 Oral     SpO2 01/28/21 1753 100 %     Weight --      Height --      Head Circumference --      Peak Flow --      Pain Score 01/28/21 1758 1     Pain Loc --      Pain Edu? --      Excl. in GC? --    No data found.  Updated Vital Signs BP (!) 162/111 (BP Location: Right Arm)   Pulse 70   Temp 98.1 F (36.7 C) (Oral)   Resp 18   SpO2 100%   Visual Acuity Right Eye Distance:   Left Eye Distance:   Bilateral Distance:    Right Eye Near:   Left Eye Near:    Bilateral Near:      Physical Exam Vitals and nursing note reviewed.  Constitutional:      General: She is not in acute distress.    Appearance: She is obese. She is not toxic-appearing.  HENT:     Head: Normocephalic.  Eyes:     General: No scleral icterus.    Conjunctiva/sclera: Conjunctivae normal.  Pulmonary:  Effort: Pulmonary effort is normal.  Musculoskeletal:        General: Normal range of motion.     Cervical back: Neck supple.     Comments: R HAND/WRIST- central palm with circular firm nodule as part of her tendon of the ring finger. Has tenderness on tendon below the thumb which is provoked with thumb use and strength testing of wrist and thumb.   Neurological:     Mental Status: She is alert and oriented to person, place, and time.     Gait: Gait normal.  Psychiatric:        Mood and Affect: Mood normal.        Behavior: Behavior normal.        Thought Content: Thought content normal.        Judgment: Judgment normal.     UC Treatments / Results  Labs (all labs ordered are listed, but only abnormal results are displayed) Labs Reviewed - No data to display  EKG   Radiology No results found.  Procedures Procedures (including critical care time)  Medications Ordered in UC Medications - No data to display  Initial Impression / Assessment and Plan / UC Course  I have reviewed the triage vital signs and the nursing notes. Has wrist tendonitis She was placed on a thumb spica splint and needs to FU with Pcp in 7-10 days. See instructions.   Final Clinical Impressions(s) / UC Diagnoses   Final diagnoses:  De Quervain's tenosynovitis, right     Discharge Instructions      Wear the brace 24/7 except during showers. Follow up with your primary care doctor in 7-10 days  Monitor our blood pressure and if stays more than 140/90 you may need medication     ED Prescriptions   None    PDMP not reviewed this encounter.   Garey Ham, [place] 01/28/21  1824

## 2021-01-28 NOTE — Discharge Instructions (Signed)
Wear the brace 24/7 except during showers. Follow up with your primary care doctor in 7-10 days  Monitor our blood pressure and if stays more than 140/90 you may need medication

## 2021-01-29 ENCOUNTER — Telehealth: Payer: Self-pay

## 2021-01-29 MED ORDER — BUPROPION HCL ER (XL) 150 MG PO TB24
ORAL_TABLET | ORAL | 1 refills | Status: DC
Start: 2021-01-29 — End: 2021-08-16

## 2021-01-29 NOTE — Telephone Encounter (Signed)
Sent to pharmacy 

## 2021-02-01 ENCOUNTER — Encounter: Payer: Self-pay | Admitting: Family Medicine

## 2021-02-26 ENCOUNTER — Other Ambulatory Visit: Payer: Self-pay

## 2021-02-26 ENCOUNTER — Telehealth (INDEPENDENT_AMBULATORY_CARE_PROVIDER_SITE_OTHER): Payer: Managed Care, Other (non HMO) | Admitting: Family Medicine

## 2021-02-26 ENCOUNTER — Encounter: Payer: Self-pay | Admitting: Family Medicine

## 2021-02-26 VITALS — Ht 65.0 in | Wt 243.0 lb

## 2021-02-26 DIAGNOSIS — D508 Other iron deficiency anemias: Secondary | ICD-10-CM

## 2021-02-26 DIAGNOSIS — J989 Respiratory disorder, unspecified: Secondary | ICD-10-CM | POA: Insufficient documentation

## 2021-02-26 DIAGNOSIS — F419 Anxiety disorder, unspecified: Secondary | ICD-10-CM | POA: Diagnosis not present

## 2021-02-26 DIAGNOSIS — F32A Depression, unspecified: Secondary | ICD-10-CM

## 2021-02-26 DIAGNOSIS — Z13220 Encounter for screening for lipoid disorders: Secondary | ICD-10-CM

## 2021-02-26 MED ORDER — DULOXETINE HCL 30 MG PO CPEP: ORAL_CAPSULE | ORAL | 3 refills | Status: DC

## 2021-02-26 MED ORDER — HYDROCOD POLST-CPM POLST ER 10-8 MG/5ML PO SUER: 5.0000 mL | Freq: Every evening | ORAL | 0 refills | Status: DC | PRN

## 2021-02-26 MED ORDER — ESCITALOPRAM OXALATE 20 MG PO TABS: 10.0000 mg | ORAL_TABLET | Freq: Every day | ORAL | Status: DC

## 2021-02-26 NOTE — Assessment & Plan Note (Signed)
Generally stable though she has had some weight gain on the Lexapro.  Weight gain could be related to Lexapro.  We will have her continue Wellbutrin 150 mg once daily.  We will transition her from Lexapro to Cymbalta.  She will take the Lexapro 10 mg once daily for 14 days and concurrently take Cymbalta 30 mg once daily.  At the end of 14 days she will discontinue the Lexapro and increase the Cymbalta to 60 mg once daily.

## 2021-02-26 NOTE — Patient Instructions (Signed)
Nice to see you. Please decrease your Lexapro dose to 10 mg (half a tablet) once daily for 14 days.  At the same time you will start on Cymbalta 30 mg (1 tablet) once daily for 14 days.  At the end of 14 days we will discontinue the Lexapro and increase the Cymbalta to 60 mg (2 tablets).

## 2021-02-26 NOTE — Assessment & Plan Note (Signed)
Likely related to poor absorption given her gastric bypass history.  She is due for follow-up labs.  Orders placed.

## 2021-02-26 NOTE — Assessment & Plan Note (Signed)
Patient with upper respiratory illness based on symptoms.  Given her recent exposures I discussed that this could still represent COVID despite her negative COVID test at home.  This also could represent influenza.  Discussed swabbing her for COVID, flu, and RSV.  She will come to the office before 4 PM to have the swab completed.  She will remain quarantined at home at least until we get the test result back.  We will treat her cough with Tussionex.  She was advised not to drive while taking this.  Her husband will pick it up.  If she gets excessively drowsy with this she will let us know.

## 2021-02-26 NOTE — Progress Notes (Signed)
Virtual Visit via video Note  This visit type was conducted due to national recommendations for restrictions regarding the COVID-19 pandemic (e.g. social distancing).  This format is felt to be most appropriate for this patient at this time.  All issues noted in this document were discussed and addressed.  No physical exam was performed (except for noted visual exam findings with Video Visits).   I connected with Anne James today at 11:00 AM EST by a video enabled telemedicine application and verified that I am speaking with the correct person using two identifiers. Location patient: home Location provider: work Persons participating in the virtual visit: patient, provider  I discussed the limitations, risks, security and privacy concerns of performing an evaluation and management service by telephone and the availability of in person appointments. I also discussed with the patient that there may be a patient responsible charge related to this service. The patient expressed understanding and agreed to proceed.  Reason for visit: f/u.  HPI: Respiratory infection: Patient notes onset on 02/24/2021.  She has sore throat, headache, low-grade fever, chills, postnasal drip, and sinus congestion.  She notes no loss of taste or smell.  She does have some cough.  She had a negative home COVID test yesterday.  She does report COVID exposure last week and possibly flu exposure as well.  Iron deficiency anemia: Notes no blood in her stool or urine.  No vaginal bleeding.  She is due for lab work.  Anxiety/depression: Patient notes no excessive symptoms.  She is on Wellbutrin and Lexapro.  She does note some weight gain and has trouble losing weight.  No SI.  De Quervain's tenosynovitis: Diagnosed at urgent care.  They advised that she wear a brace.  It does continue to bother her.   ROS: See pertinent positives and negatives per HPI.  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 No periods since 2006.   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    Past Surgical History:  Procedure Laterality Date   CHOLECYSTECTOMY N/A 09/18/2016   Procedure: LAPAROSCOPIC CHOLECYSTECTOMY;  Surgeon: Jules Husbands, MD;  Location: [facility];  Service: General;  Laterality: N/A;   [place]  02/02/2018   Procedure: INTERNAL HERNIA  REDUCTION;  Surgeon: Jules Husbands, MD;  Location: [facility];  Service: General;;   ESOPHAGOGASTRODUODENOSCOPY (EGD) WITH PROPOFOL N/A 02/02/2018   Procedure: ESOPHAGOGASTRODUODENOSCOPY (EGD) WITH PROPOFOL;  Surgeon: Toledo, Benay Pike, MD;  Location: [facility];  Service: Gastroenterology;  Laterality: N/A;   GASTRIC BYPASS  2002   [facility], Dr. Fatima Sanger   LAPAROSCOPY N/A 02/02/2018   Procedure: LAPAROSCOPY DIAGNOSTIC CONVERTED TO OPEN;  Surgeon: Jules Husbands, MD;  Location: [facility];  Service: General;  Laterality: N/A;   NOVASURE ABLATION     TOTAL KNEE ARTHROPLASTY Left 06/29/2015   Procedure: LEFT TOTAL KNEE ARTHROPLASTY;  Surgeon: Elsie Saas, MD;  Location: [place];  Service: Orthopedics;  Laterality: Left;   TOTAL KNEE ARTHROPLASTY Right 01/18/2016   Procedure: TOTAL KNEE ARTHROPLASTY;  Surgeon: Elsie Saas, MD;  Location: [facility];  Service: Orthopedics;  Laterality: Right;   TUBAL LIGATION      Family History  Problem Relation Age of Onset   Alcohol abuse Mother    Drug abuse Mother    CAD Mother    Arthritis Maternal  Grandmother    Heart disease Maternal Grandmother    Stroke Maternal Grandmother    Hypertension Maternal Grandmother    Kidney disease Maternal Grandmother    Diabetes Maternal Grandmother    Anxiety disorder Brother    Hypertension Father     SOCIAL HX: nonsmoker   Current Outpatient Medications:    buPROPion (WELLBUTRIN XL) 150 MG 24 hr tablet, TAKE 1 TABLET(150 MG) BY MOUTH DAILY, Disp: 90 tablet, Rfl: 1   celecoxib (CELEBREX) 50 MG capsule, TAKE 1 CAPSULE(50 MG) BY MOUTH TWICE DAILY AS NEEDED FOR PAIN, Disp: 180 capsule, Rfl: 0   chlorpheniramine-HYDROcodone (TUSSIONEX PENNKINETIC ER) 10-8 MG/5ML SUER, Take 5 mLs by mouth at bedtime as needed for cough., Disp: 140 mL, Rfl: 0   DULoxetine (CYMBALTA) 30 MG capsule, Take 1 capsule (30 mg total) by mouth daily for 14 days, THEN 2 capsules (60 mg total) daily., Disp: 180 capsule, Rfl: 3   ipratropium (ATROVENT) 0.06 % nasal spray, USE 2 SPRAY(S) IN EACH NOSTRIL 3 TO 4 TIMES DAILY AS NEEDED FOR ALLERGIES, Disp: , Rfl:    Multiple Vitamin (MULTIVITAMIN) capsule, Take 1 capsule by mouth daily., Disp: , Rfl:    omeprazole (PRILOSEC) 20 MG capsule, TAKE 1 CAPSULE(20 MG) BY MOUTH DAILY, Disp: 90 capsule, Rfl: 1   escitalopram (LEXAPRO) 20 MG tablet, Take 0.5 tablets (10 mg total) by mouth daily for 14 days., Disp: , Rfl:   EXAM:  VITALS per patient if applicable:  GENERAL: alert, oriented, appears well and in no acute distress  HEENT: atraumatic, conjunttiva clear, no obvious abnormalities on inspection of external nose and ears  NECK: normal movements of the head and neck  LUNGS: on inspection no signs of respiratory distress, breathing rate appears normal, no obvious gross SOB, gasping or wheezing  CV: no obvious cyanosis  MS: moves all visible extremities without noticeable abnormality  PSYCH/NEURO: pleasant and cooperative, no obvious depression or anxiety, speech and thought processing grossly intact  ASSESSMENT AND  PLAN:  Discussed the following assessment and plan:  Problem List Items Addressed This Visit     Anxiety and depression (Chronic)    Generally stable though she has had some weight gain on the Lexapro.  Weight gain could be related to Lexapro.  We will have her continue Wellbutrin 150 mg once daily.  We will transition her from Lexapro to Cymbalta.  She will take the Lexapro 10 mg once daily for 14 days and concurrently take Cymbalta 30 mg once daily.  At the end of 14 days she will discontinue the Lexapro and increase the Cymbalta to 60 mg once daily.        Relevant Medications   DULoxetine (CYMBALTA) 30 MG capsule   escitalopram (LEXAPRO) 20 MG tablet   Iron deficiency anemia secondary to inadequate dietary iron intake (Chronic)    Likely related to poor absorption given her gastric bypass history.  She is due for follow-up labs.  Orders placed.      Relevant Orders   CBC  IBC + Ferritin   Respiratory illness - Primary    Patient with upper respiratory illness based on symptoms.  Given her recent exposures I discussed that this could still represent COVID despite her negative COVID test at home.  This also could represent influenza.  Discussed swabbing her for COVID, flu, and RSV.  She will come to the office before 4 PM to have the swab completed.  She will remain quarantined at home at least until we get the test result back.  We will treat her cough with Tussionex.  She was advised not to drive while taking this.  Her husband will pick it up.  If she gets excessively drowsy with this she will let us know.      Relevant Medications   chlorpheniramine-HYDROcodone (TUSSIONEX PENNKINETIC ER) 10-8 MG/5ML SUER   Other Relevant Orders   COVID-19, Flu A+B and RSV   Other Visit Diagnoses     Morbid obesity ([place])       Relevant Orders   HgB A1c   Lipid screening       Relevant Orders   Comp Met (CMET)   Lipid panel       Return in about 2 weeks (around 03/12/2021) for Labs, 3  months PCP.   I discussed the assessment and treatment plan with the patient. The patient was provided an opportunity to ask questions and all were answered. The patient agreed with the plan and demonstrated an understanding of the instructions.   The patient was advised to call back or seek an in-person evaluation if the symptoms worsen or if the condition fails to improve as anticipated.  Tommi Rumps, MD

## 2021-02-27 ENCOUNTER — Telehealth: Payer: Self-pay | Admitting: Family Medicine

## 2021-02-27 LAB — COVID-19, FLU A+B AND RSV
Influenza A, NAA: NOT DETECTED
Influenza B, NAA: NOT DETECTED
RSV, NAA: NOT DETECTED
SARS-CoV-2, NAA: DETECTED — AB

## 2021-02-27 MED ORDER — MOLNUPIRAVIR EUA 200MG CAPSULE: 4.0000 | ORAL_CAPSULE | Freq: Two times a day (BID) | ORAL | 0 refills | Status: AC

## 2021-02-27 NOTE — Telephone Encounter (Signed)
I called and spoke with the patient regarding her positive COVID result. Advised quarantine at least through 03/01/21. Advised if she was feeling significantly better at the time she could come off of quarantine the next day and wear a mask if she had to leave the house. If she is not feeling significantly better at that time I advised that she should remain quarantined through 03/06/21. Advised to seek medical attention for shortness of breath, chest pain, cough productive of blood, or significant fevers. We discussed EUA medications paxlovid and molnupiravir. She is high risk given her BMI. Discussed the purpose of these medications is to reduce the risk of hospitalization and death from COVID.Given lack of recent lab work we will proceed with molnupiravir. She has had a previous tubal ligation. She was advised to monitor for diarrhea with this medication. She was advised to avoid anyone living in her household and if she needed to be around them she and they should wear masks. She will contact us with questions.

## 2021-03-09 NOTE — Progress Notes (Signed)
Mailbox is full.     Anne James,cma  

## 2021-03-11 ENCOUNTER — Other Ambulatory Visit (INDEPENDENT_AMBULATORY_CARE_PROVIDER_SITE_OTHER): Payer: Managed Care, Other (non HMO)

## 2021-03-11 ENCOUNTER — Other Ambulatory Visit: Payer: Self-pay

## 2021-03-11 DIAGNOSIS — D508 Other iron deficiency anemias: Secondary | ICD-10-CM

## 2021-03-11 DIAGNOSIS — Z13220 Encounter for screening for lipoid disorders: Secondary | ICD-10-CM

## 2021-03-12 LAB — COMPREHENSIVE METABOLIC PANEL
ALT: 17 IU/L (ref 0–32)
AST: 26 IU/L (ref 0–40)
Albumin/Globulin Ratio: 1.7 (ref 1.2–2.2)
Albumin: 4.1 g/dL (ref 3.8–4.9)
Alkaline Phosphatase: 92 IU/L (ref 44–121)
BUN/Creatinine Ratio: 15 (ref 9–23)
BUN: 14 mg/dL (ref 6–24)
Bilirubin Total: 0.3 mg/dL (ref 0.0–1.2)
CO2: 23 mmol/L (ref 20–29)
Calcium: 9 mg/dL (ref 8.7–10.2)
Chloride: 98 mmol/L (ref 96–106)
Creatinine, Ser: 0.92 mg/dL (ref 0.57–1.00)
Globulin, Total: 2.4 g/dL (ref 1.5–4.5)
Glucose: 86 mg/dL (ref 70–99)
Potassium: 4.7 mmol/L (ref 3.5–5.2)
Sodium: 137 mmol/L (ref 134–144)
Total Protein: 6.5 g/dL (ref 6.0–8.5)
eGFR: 75 mL/min/{1.73_m2} (ref >59)

## 2021-03-12 LAB — CBC WITH DIFFERENTIAL/PLATELET
Basophils Absolute: 0 10*3/uL (ref 0.0–0.2)
Basos: 1 %
EOS (ABSOLUTE): 0.4 10*3/uL (ref 0.0–0.4)
Eos: 6 %
Hematocrit: 41.5 % (ref 34.0–46.6)
Hemoglobin: 13.5 g/dL (ref 11.1–15.9)
Immature Grans (Abs): 0 10*3/uL (ref 0.0–0.1)
Immature Granulocytes: 0 %
Lymphocytes Absolute: 2.2 10*3/uL (ref 0.7–3.1)
Lymphs: 33 %
MCH: 28.7 pg (ref 26.6–33.0)
MCHC: 32.5 g/dL (ref 31.5–35.7)
MCV: 88 fL (ref 79–97)
Monocytes Absolute: 0.7 10*3/uL (ref 0.1–0.9)
Monocytes: 11 %
Neutrophils Absolute: 3.3 10*3/uL (ref 1.4–7.0)
Neutrophils: 49 %
Platelets: 402 10*3/uL (ref 150–450)
RBC: 4.71 x10E6/uL (ref 3.77–5.28)
RDW: 14.3 % (ref 11.7–15.4)
WBC: 6.6 10*3/uL (ref 3.4–10.8)

## 2021-03-12 LAB — IRON,TIBC AND FERRITIN PANEL
Ferritin: 36 ng/mL (ref 15–150)
Iron Saturation: 37 % (ref 15–55)
Iron: 107 ug/dL (ref 27–159)
Total Iron Binding Capacity: 289 ug/dL (ref 250–450)
UIBC: 182 ug/dL (ref 131–425)

## 2021-03-12 LAB — LIPID PANEL
Chol/HDL Ratio: 2 ratio (ref 0.0–4.4)
Cholesterol, Total: 240 mg/dL — ABNORMAL HIGH (ref 100–199)
HDL: 118 mg/dL (ref >39)
LDL Chol Calc (NIH): 107 mg/dL — ABNORMAL HIGH (ref 0–99)
Triglycerides: 88 mg/dL (ref 0–149)
VLDL Cholesterol Cal: 15 mg/dL (ref 5–40)

## 2021-03-12 LAB — HEMOGLOBIN A1C
Est. average glucose Bld gHb Est-mCnc: 103 mg/dL
Hgb A1c MFr Bld: 5.2 % (ref 4.8–5.6)

## 2021-03-17 ENCOUNTER — Telehealth: Payer: Self-pay | Admitting: Family Medicine

## 2021-03-17 ENCOUNTER — Encounter: Payer: Self-pay | Admitting: *Deleted

## 2021-03-17 NOTE — Telephone Encounter (Signed)
Patient called in to speak with Coralee North about lab results .Please call patient @ [phone]

## 2021-03-18 NOTE — Telephone Encounter (Signed)
VM for patient to call back with questions about lab results.  Caris Cerveny,cma

## 2021-08-16 ENCOUNTER — Other Ambulatory Visit: Payer: Self-pay | Admitting: Family Medicine

## 2021-11-12 ENCOUNTER — Other Ambulatory Visit: Payer: Self-pay | Admitting: Family Medicine

## 2021-11-12 DIAGNOSIS — K219 Gastro-esophageal reflux disease without esophagitis: Secondary | ICD-10-CM

## 2021-12-31 ENCOUNTER — Encounter: Payer: Self-pay | Admitting: Family Medicine

## 2021-12-31 ENCOUNTER — Ambulatory Visit (INDEPENDENT_AMBULATORY_CARE_PROVIDER_SITE_OTHER): Payer: Managed Care, Other (non HMO) | Admitting: Family Medicine

## 2021-12-31 VITALS — BP 130/70 | HR 97 | Temp 98.4°F | Ht 65.0 in | Wt 246.2 lb

## 2021-12-31 DIAGNOSIS — I10 Essential (primary) hypertension: Secondary | ICD-10-CM | POA: Diagnosis not present

## 2021-12-31 DIAGNOSIS — Z23 Encounter for immunization: Secondary | ICD-10-CM | POA: Diagnosis not present

## 2021-12-31 DIAGNOSIS — F419 Anxiety disorder, unspecified: Secondary | ICD-10-CM

## 2021-12-31 DIAGNOSIS — F32A Depression, unspecified: Secondary | ICD-10-CM

## 2021-12-31 MED ORDER — AMLODIPINE BESYLATE 5 MG PO TABS: 5.0000 mg | ORAL_TABLET | Freq: Every day | ORAL | 1 refills | Status: DC

## 2021-12-31 MED ORDER — DULOXETINE HCL 60 MG PO CPEP: 60.0000 mg | ORAL_CAPSULE | Freq: Every day | ORAL | 3 refills | Status: DC

## 2021-12-31 NOTE — Patient Instructions (Signed)
Nice to see you. Please try to start walking 3 days a week for exercise. Please work with [employer] watchers on your diet and try to reduce your portion sizes. Please go to [facility] and get your labs as discussed. We will start you on amlodipine once daily for your blood pressure.  If you have leg swelling or constipation with this medication please let us know.

## 2021-12-31 NOTE — Assessment & Plan Note (Signed)
New issue.  Fairly elevated at home at times.  BP today is acceptable.  We will start amlodipine 5 mg daily.  Discussed risk of constipation and edema.  She will have labs through [facility].  EKG completed today as well.

## 2021-12-31 NOTE — Assessment & Plan Note (Signed)
I discussed the patient will need to have diet and exercise in place prior to getting any weight loss medication.  Discussed that weight loss injections at this time are on backorder and were unable to get the starting doses.  Discussed reduction in portion sizes.  Patient notes she has done weight watchers in the past and I encouraged her to restart that.  She will try to add in walking 3 days a week.  We will follow-up in 4 weeks for this issue and her blood pressure.  I did advise on the medullary thyroid cancer risk with injectable weight loss medications.  Discussed risk of pancreatitis with that kind of medication as well.

## 2021-12-31 NOTE — Progress Notes (Signed)
Marikay Alar, MD Phone: [phone]  Anne James is a 51 y.o. female who presents today for f/u.  HYPERTENSION Disease Monitoring Home BP Monitoring 160s/90s or higher at times Chest pain- no    Dyspnea- no Medications Compliance-  taking no medications, new diagnosis  Edema- no BMET    Component Value Date/Time   NA 137 03/11/2021 1538   NA 139 08/23/2011 1744   K 4.7 03/11/2021 1538   K 4.0 08/23/2011 1744   CL 98 03/11/2021 1538   CL 105 08/23/2011 1744   CO2 23 03/11/2021 1538   CO2 26 08/23/2011 1744   GLUCOSE 86 03/11/2021 1538   GLUCOSE 117 (H) 02/22/2018 1825   GLUCOSE 84 08/23/2011 1744   BUN 14 03/11/2021 1538   BUN 15 08/23/2011 1744   CREATININE 0.92 03/11/2021 1538   CREATININE 0.84 08/23/2011 1744   CALCIUM 9.0 03/11/2021 1538   CALCIUM 8.8 08/23/2011 1744   GFRNONAA 87 10/07/2019 0903   GFRNONAA >60 08/23/2011 1744   GFRAA 100 10/07/2019 0903   GFRAA >60 08/23/2011 1744   Morbid obesity: Patient notes her work schedule does interfere with exercise.  She tries to stay active on the weekend with her grandchildren.  She tries to avoid sweets.  She will have a scrambled egg and some bacon or cereal or oatmeal or yogurt and banana for breakfast.  Lunch usually consists of leftovers or salad with tuna.  She has snack in the afternoon with carrots or crackers with peanut butter on them.  She will oftentimes have a pasta based dinner.  She is interested in [place].  She notes no personal or family history of thyroid cancer, parathyroid cancer, or adrenal gland cancer.  No personal history of pancreatitis.  She is status postcholecystectomy.  Social History   Tobacco Use  Smoking Status Never  Smokeless Tobacco Never    Current Outpatient Medications on File Prior to Visit  Medication Sig Dispense Refill  . buPROPion (WELLBUTRIN XL) 150 MG 24 hr tablet TAKE 1 TABLET(150 MG) BY MOUTH DAILY 90 tablet 0  . ipratropium (ATROVENT) 0.06 % nasal spray USE 2  SPRAY(S) IN EACH NOSTRIL 3 TO 4 TIMES DAILY AS NEEDED FOR ALLERGIES    . Multiple Vitamin (MULTIVITAMIN) capsule Take 1 capsule by mouth daily.    [name] omeprazole (PRILOSEC) 20 MG capsule TAKE 1 CAPSULE(20 MG) BY MOUTH DAILY 90 capsule 1  . [DISCONTINUED] promethazine (PHENERGAN) 25 MG suppository Place 1 suppository (25 mg total) rectally every 6 (six) hours as needed for nausea. (Patient not taking: Reported on 08/10/2018) 12 suppository 1   No current facility-administered medications on file prior to visit.     ROS see history of present illness  Objective  Physical Exam Vitals:   12/31/21 1611  BP: 130/70  Pulse: 97  Temp: 98.4 F (36.9 C)  SpO2: 100%    BP Readings from Last 3 Encounters:  12/31/21 130/70  01/28/21 (!) 177/96  11/18/19 (!) 142/90   Wt Readings from Last 3 Encounters:  12/31/21 246 lb 3.2 oz (111.7 kg)  02/26/21 243 lb (110.2 kg)  07/01/20 221 lb (100.2 kg)    Physical Exam Constitutional:      General: She is not in acute distress.    Appearance: She is not diaphoretic.  Cardiovascular:     Rate and Rhythm: Normal rate and regular rhythm.     Heart sounds: Normal heart sounds.  Pulmonary:     Effort: Pulmonary effort is normal.  Breath sounds: Normal breath sounds.  Abdominal:     Comments: No abdominal bruits  Skin:    General: Skin is warm and dry.  Neurological:     Mental Status: She is alert.     Assessment/Plan: Please see individual problem list.  Problem List Items Addressed This Visit     Anxiety and depression (Chronic)   Relevant Medications   DULoxetine (CYMBALTA) 60 MG capsule   Other Visit Diagnoses     Hypertension, unspecified type    -  Primary   Relevant Orders   EKG 12-Lead   POCT Urinalysis Dipstick   CBC   Comp Met (CMET)   HgB A1c   Lipid panel   TSH        Health Maintenance: ***  No follow-ups on file.   Tommi Rumps, MD [place]

## 2022-01-03 ENCOUNTER — Telehealth: Payer: Self-pay

## 2022-01-03 NOTE — Telephone Encounter (Signed)
Mailbox full could not leave a message a mychart message was sent.  Anne James,cma

## 2022-01-28 ENCOUNTER — Other Ambulatory Visit: Payer: Managed Care, Other (non HMO)

## 2022-02-01 ENCOUNTER — Ambulatory Visit: Payer: Managed Care, Other (non HMO) | Admitting: Family Medicine

## 2022-02-01 ENCOUNTER — Encounter: Payer: Self-pay | Admitting: Family Medicine

## 2022-02-01 VITALS — BP 152/82 | HR 93 | Temp 98.0°F | Ht 65.0 in | Wt 244.6 lb

## 2022-02-01 DIAGNOSIS — Z1211 Encounter for screening for malignant neoplasm of colon: Secondary | ICD-10-CM | POA: Diagnosis not present

## 2022-02-01 DIAGNOSIS — I10 Essential (primary) hypertension: Secondary | ICD-10-CM | POA: Diagnosis not present

## 2022-02-01 DIAGNOSIS — Z1231 Encounter for screening mammogram for malignant neoplasm of breast: Secondary | ICD-10-CM | POA: Diagnosis not present

## 2022-02-01 MED ORDER — AMLODIPINE BESYLATE 10 MG PO TABS: 10.0000 mg | ORAL_TABLET | Freq: Every day | ORAL | 1 refills | Status: DC

## 2022-02-01 MED ORDER — BUPROPION HCL ER (XL) 150 MG PO TB24: ORAL_TABLET | ORAL | 1 refills | Status: DC

## 2022-02-01 NOTE — Patient Instructions (Signed)
Please call [phone] to schedule your mammogram. 

## 2022-02-01 NOTE — Progress Notes (Signed)
Tommi Rumps, MD Phone: [phone]  Anne James is a 51 y.o. female who presents today for f/u.  HYPERTENSION Disease Monitoring Home BP Monitoring 462M-638T systolic Chest pain- no    Dyspnea- no Medications Compliance-  taking amlodipine (no side effects).  Edema- no BMET    Component Value Date/Time   NA 137 03/11/2021 1538   NA 139 08/23/2011 1744   K 4.7 03/11/2021 1538   K 4.0 08/23/2011 1744   CL 98 03/11/2021 1538   CL 105 08/23/2011 1744   CO2 23 03/11/2021 1538   CO2 26 08/23/2011 1744   GLUCOSE 86 03/11/2021 1538   GLUCOSE 117 (H) 02/22/2018 1825   GLUCOSE 84 08/23/2011 1744   BUN 14 03/11/2021 1538   BUN 15 08/23/2011 1744   CREATININE 0.92 03/11/2021 1538   CREATININE 0.84 08/23/2011 1744   CALCIUM 9.0 03/11/2021 1538   CALCIUM 8.8 08/23/2011 1744   GFRNONAA 87 10/07/2019 0903   GFRNONAA >60 08/23/2011 1744   GFRAA 100 10/07/2019 0903   GFRAA >60 08/23/2011 1744   Obesity: Patient has started to walk on the weekends.  She has increased her water intake.  She is going 3 to 4 days without a soda.  She does not snack.  She generally eats healthy.  Does report some stress with her granddaughter running away from home on a couple of occasions recently.  Social History   Tobacco Use  Smoking Status Never  Smokeless Tobacco Never    Current Outpatient Medications on File Prior to Visit  Medication Sig Dispense Refill   DULoxetine (CYMBALTA) 60 MG capsule Take 1 capsule (60 mg total) by mouth daily. 90 capsule 3   ipratropium (ATROVENT) 0.06 % nasal spray USE 2 SPRAY(S) IN EACH NOSTRIL 3 TO 4 TIMES DAILY AS NEEDED FOR ALLERGIES     Multiple Vitamin (MULTIVITAMIN) capsule Take 1 capsule by mouth daily.     omeprazole (PRILOSEC) 20 MG capsule TAKE 1 CAPSULE(20 MG) BY MOUTH DAILY 90 capsule 1   [DISCONTINUED] promethazine (PHENERGAN) 25 MG suppository Place 1 suppository (25 mg total) rectally every 6 (six) hours as needed for nausea. (Patient not taking:  Reported on 08/10/2018) 12 suppository 1   No current facility-administered medications on file prior to visit.     ROS see history of present illness  Objective  Physical Exam Vitals:   02/01/22 1308 02/01/22 1324  BP: (!) 158/84 (!) 152/82  Pulse: 93   Temp: 98 F (36.7 C)   SpO2: 99%     BP Readings from Last 3 Encounters:  02/01/22 (!) 152/82  12/31/21 130/70  01/28/21 (!) 177/96   Wt Readings from Last 3 Encounters:  02/01/22 244 lb 9.6 oz (110.9 kg)  12/31/21 246 lb 3.2 oz (111.7 kg)  02/26/21 243 lb (110.2 kg)    Physical Exam Constitutional:      General: She is not in acute distress.    Appearance: She is not diaphoretic.  Cardiovascular:     Rate and Rhythm: Normal rate and regular rhythm.     Heart sounds: Normal heart sounds.  Pulmonary:     Effort: Pulmonary effort is normal.     Breath sounds: Normal breath sounds.  Skin:    General: Skin is warm and dry.  Neurological:     Mental Status: She is alert.      Assessment/Plan: Please see individual problem list.  Problem List Items Addressed This Visit     Hypertension - Primary (Chronic)  Increase amlodipine to 10 mg daily.  She will contact me in 2 to 3 weeks if her blood pressure has not trended down to less than 140/90 consistently.  We could then add an additional medication.      Relevant Medications   amLODipine (NORVASC) 10 MG tablet   Other Relevant Orders   Lipid panel   Comp Met (CMET)   CBC   TSH   Morbid obesity (HCC) (Chronic)    We will start the patient on Wegovy.  They were counseled on the risk of pancreatitis and gallbladder disease.  Discussed the risk of nausea.  They were advised to discontinue the [facility] and contact us immediately if they develop abdominal pain.  If they develop excessive nausea they will contact us right away.  I discussed that medullary thyroid cancer has been seen in rats studies.  The patient confirmed no personal or family history of thyroid  cancer, parathyroid cancer, or adrenal gland cancer.  Discussed that we thus far have not seen medullary thyroid cancer result from use of this type of medication in humans.  They were advised to monitor the thyroid area and contact us for any lumps, swelling, trouble swallowing, or any other changes in this area.        Relevant Orders   HgB A1c   Other Visit Diagnoses     Colon cancer screening       Relevant Orders   Ambulatory referral to Gastroenterology   Encounter for screening mammogram for malignant neoplasm of breast       Relevant Orders   MM 3D SCREEN BREAST BILATERAL        Health Maintenance: GI referral for colon cancer screening placed.  Patient will call to schedule her mammogram.  Return in about 6 weeks (around 03/15/2022).   Tommi Rumps, MD [place]

## 2022-02-01 NOTE — Assessment & Plan Note (Addendum)
We will start the patient on Wegovy.  They were counseled on the risk of pancreatitis and gallbladder disease.  Discussed the risk of nausea.  They were advised to discontinue the [facility] and contact us immediately if they develop abdominal pain.  If they develop excessive nausea they will contact us right away.  I discussed that medullary thyroid cancer has been seen in rats studies.  The patient confirmed no personal or family history of thyroid cancer, parathyroid cancer, or adrenal gland cancer.  Discussed that we thus far have not seen medullary thyroid cancer result from use of this type of medication in humans.  They were advised to monitor the thyroid area and contact us for any lumps, swelling, trouble swallowing, or any other changes in this area.

## 2022-02-01 NOTE — Assessment & Plan Note (Signed)
Increase amlodipine to 10 mg daily.  She will contact me in 2 to 3 weeks if her blood pressure has not trended down to less than 140/90 consistently.  We could then add an additional medication.

## 2022-02-02 LAB — COMPREHENSIVE METABOLIC PANEL
ALT: 18 IU/L (ref 0–32)
AST: 29 IU/L (ref 0–40)
Albumin/Globulin Ratio: 1.9 (ref 1.2–2.2)
Albumin: 4.3 g/dL (ref 3.8–4.9)
Alkaline Phosphatase: 97 IU/L (ref 44–121)
BUN/Creatinine Ratio: 15 (ref 9–23)
BUN: 12 mg/dL (ref 6–24)
Bilirubin Total: 0.4 mg/dL (ref 0.0–1.2)
CO2: 23 mmol/L (ref 20–29)
Calcium: 9.4 mg/dL (ref 8.7–10.2)
Chloride: 96 mmol/L (ref 96–106)
Creatinine, Ser: 0.82 mg/dL (ref 0.57–1.00)
Globulin, Total: 2.3 g/dL (ref 1.5–4.5)
Glucose: 98 mg/dL (ref 70–99)
Potassium: 4.6 mmol/L (ref 3.5–5.2)
Sodium: 136 mmol/L (ref 134–144)
Total Protein: 6.6 g/dL (ref 6.0–8.5)
eGFR: 85 mL/min/{1.73_m2} (ref >59)

## 2022-02-02 LAB — HEMOGLOBIN A1C
Est. average glucose Bld gHb Est-mCnc: 103 mg/dL
Hgb A1c MFr Bld: 5.2 % (ref 4.8–5.6)

## 2022-02-02 LAB — LIPID PANEL
Chol/HDL Ratio: 1.8 ratio (ref 0.0–4.4)
Cholesterol, Total: 264 mg/dL — ABNORMAL HIGH (ref 100–199)
HDL: 150 mg/dL (ref >39)
LDL Chol Calc (NIH): 100 mg/dL — ABNORMAL HIGH (ref 0–99)
Triglycerides: 87 mg/dL (ref 0–149)
VLDL Cholesterol Cal: 14 mg/dL (ref 5–40)

## 2022-02-02 LAB — CBC
Hematocrit: 41.3 % (ref 34.0–46.6)
Hemoglobin: 13.7 g/dL (ref 11.1–15.9)
MCH: 29.3 pg (ref 26.6–33.0)
MCHC: 33.2 g/dL (ref 31.5–35.7)
MCV: 88 fL (ref 79–97)
Platelets: 370 10*3/uL (ref 150–450)
RBC: 4.68 x10E6/uL (ref 3.77–5.28)
RDW: 15 % (ref 11.7–15.4)
WBC: 6.8 10*3/uL (ref 3.4–10.8)

## 2022-02-02 LAB — TSH: TSH: 2.79 u[IU]/mL (ref 0.450–4.500)

## 2022-02-16 ENCOUNTER — Encounter: Payer: Self-pay | Admitting: Family Medicine

## 2022-02-17 MED ORDER — SEMAGLUTIDE-WEIGHT MANAGEMENT 0.5 MG/0.5ML ~~LOC~~ SOAJ: 0.5000 mg | SUBCUTANEOUS | 0 refills | Status: AC

## 2022-02-17 MED ORDER — SEMAGLUTIDE-WEIGHT MANAGEMENT 1 MG/0.5ML ~~LOC~~ SOAJ: 1.0000 mg | SUBCUTANEOUS | 0 refills | Status: AC

## 2022-02-17 MED ORDER — SEMAGLUTIDE-WEIGHT MANAGEMENT 1.7 MG/0.75ML ~~LOC~~ SOAJ: 1.7000 mg | SUBCUTANEOUS | 0 refills | Status: DC

## 2022-02-17 MED ORDER — SEMAGLUTIDE-WEIGHT MANAGEMENT 0.25 MG/0.5ML ~~LOC~~ SOAJ: 0.2500 mg | SUBCUTANEOUS | 0 refills | Status: AC

## 2022-02-17 MED ORDER — SEMAGLUTIDE-WEIGHT MANAGEMENT 2.4 MG/0.75ML ~~LOC~~ SOAJ: 2.4000 mg | SUBCUTANEOUS | 1 refills | Status: DC

## 2022-05-14 ENCOUNTER — Other Ambulatory Visit: Payer: Self-pay | Admitting: Family

## 2022-05-14 DIAGNOSIS — K219 Gastro-esophageal reflux disease without esophagitis: Secondary | ICD-10-CM

## 2022-05-27 ENCOUNTER — Encounter: Payer: Self-pay | Admitting: Oncology

## 2022-05-27 ENCOUNTER — Ambulatory Visit: Payer: Managed Care, Other (non HMO)

## 2022-05-27 DIAGNOSIS — K5730 Diverticulosis of large intestine without perforation or abscess without bleeding: Secondary | ICD-10-CM | POA: Diagnosis not present

## 2022-05-27 DIAGNOSIS — Z1211 Encounter for screening for malignant neoplasm of colon: Secondary | ICD-10-CM | POA: Diagnosis not present

## 2022-05-27 DIAGNOSIS — K640 First degree hemorrhoids: Secondary | ICD-10-CM | POA: Diagnosis not present

## 2022-06-05 ENCOUNTER — Other Ambulatory Visit: Payer: Self-pay | Admitting: Family Medicine

## 2022-07-14 ENCOUNTER — Ambulatory Visit
Admission: RE | Admit: 2022-07-14 | Discharge: 2022-07-14 | Disposition: A | Discharge status: Home / Self Care | Payer: Managed Care, Other (non HMO) | Source: Ambulatory Visit | Attending: Family Medicine | Admitting: Family Medicine

## 2022-07-14 DIAGNOSIS — Z1231 Encounter for screening mammogram for malignant neoplasm of breast: Secondary | ICD-10-CM | POA: Diagnosis present

## 2022-08-17 ENCOUNTER — Other Ambulatory Visit: Payer: Self-pay | Admitting: Family Medicine

## 2022-08-17 DIAGNOSIS — I10 Essential (primary) hypertension: Secondary | ICD-10-CM

## 2022-08-22 ENCOUNTER — Other Ambulatory Visit (HOSPITAL_COMMUNITY): Payer: Self-pay

## 2022-08-31 ENCOUNTER — Telehealth: Payer: Self-pay

## 2022-08-31 NOTE — Telephone Encounter (Signed)
Pt's PA for [facility] expires soon.  Please complete PA.  Thank you

## 2022-09-02 ENCOUNTER — Other Ambulatory Visit (HOSPITAL_COMMUNITY): Payer: Self-pay

## 2022-09-05 ENCOUNTER — Other Ambulatory Visit (HOSPITAL_COMMUNITY): Payer: Self-pay

## 2022-09-06 ENCOUNTER — Telehealth: Payer: Self-pay

## 2022-09-06 ENCOUNTER — Encounter: Payer: Self-pay | Admitting: Oncology

## 2022-09-06 ENCOUNTER — Other Ambulatory Visit (HOSPITAL_COMMUNITY): Payer: Self-pay

## 2022-09-06 NOTE — Telephone Encounter (Signed)
PA has been DENIED:   The denial was based on our criteria for Wegovy Inj 2.4mg .  Per your health plan's criteria, this drug is covered if you meet the following: (1) One of the following: (A) You have been receiving Wegovy therapy for up to 7 months and have had a weight loss of more than or equal to 5% of baseline body weight. (B) You have been receiving Wegovy therapy for more than 7 months and are continuing to experience or maintain weight loss. (2) Both of the following: (A) You have been following the treatment for four months in a row. (B) One of the following: (I) You are currently on a maintenance dose of 1.7mg  or 2.4mg  once weekly. (II) You have received less than 7 months of treatment with [facility] and are continuing to adjust to a target dose of 1.7mg  or 2.4mg  once weekly.

## 2022-09-06 NOTE — Telephone Encounter (Signed)
PA has been submitted and is being documented in a separate encounter.

## 2022-09-06 NOTE — Telephone Encounter (Signed)
Pharmacy Patient Advocate Encounter   Received notification from Pt Calls Messages that prior authorization for [facility] is required/requested.   Insurance verification completed.   The patient is insured through [facility] .   Per test claim:  PA submitted to [facility] via CoverMyMeds Key/confirmation #/EOC [id number] Status is pending

## 2022-09-07 NOTE — Telephone Encounter (Signed)
noted 

## 2022-09-23 ENCOUNTER — Encounter: Payer: Self-pay | Admitting: Family Medicine

## 2022-09-23 ENCOUNTER — Ambulatory Visit: Payer: Managed Care, Other (non HMO) | Admitting: Family Medicine

## 2022-09-23 VITALS — BP 128/84 | HR 85 | Temp 98.2°F | Ht 65.0 in | Wt 222.0 lb

## 2022-09-23 DIAGNOSIS — I10 Essential (primary) hypertension: Secondary | ICD-10-CM

## 2022-09-23 DIAGNOSIS — M654 Radial styloid tenosynovitis [de Quervain]: Secondary | ICD-10-CM

## 2022-09-23 DIAGNOSIS — F419 Anxiety disorder, unspecified: Secondary | ICD-10-CM | POA: Diagnosis not present

## 2022-09-23 DIAGNOSIS — F32A Depression, unspecified: Secondary | ICD-10-CM

## 2022-09-23 MED ORDER — ZEPBOUND 2.5 MG/0.5ML ~~LOC~~ SOAJ
2.5000 mg | SUBCUTANEOUS | 0 refills | Status: AC
Start: 2022-09-23 — End: 2022-10-21

## 2022-09-23 MED ORDER — ZEPBOUND 5 MG/0.5ML ~~LOC~~ SOAJ
5.0000 mg | SUBCUTANEOUS | 1 refills | Status: DC
Start: 2022-10-21 — End: 2024-01-11

## 2022-09-23 NOTE — Progress Notes (Signed)
Marikay Alar, MD Phone: [phone]  Anne James is a 52 y.o. female who presents today for f/u.  HYPERTENSION Disease Monitoring Home BP Monitoring not really checking at home Chest pain- no    Dyspnea- no Medications Compliance-  taking amlodipine.  Edema- no BMET    Component Value Date/Time   NA 136 02/01/2022 1323   NA 139 08/23/2011 1744   K 4.6 02/01/2022 1323   K 4.0 08/23/2011 1744   CL 96 02/01/2022 1323   CL 105 08/23/2011 1744   CO2 23 02/01/2022 1323   CO2 26 08/23/2011 1744   GLUCOSE 98 02/01/2022 1323   GLUCOSE 117 (H) 02/22/2018 1825   GLUCOSE 84 08/23/2011 1744   BUN 12 02/01/2022 1323   BUN 15 08/23/2011 1744   CREATININE 0.82 02/01/2022 1323   CREATININE 0.84 08/23/2011 1744   CALCIUM 9.4 02/01/2022 1323   CALCIUM 8.8 08/23/2011 1744   GFRNONAA 87 10/07/2019 0903   GFRNONAA >60 08/23/2011 1744   GFRAA 100 10/07/2019 0903   GFRAA >60 08/23/2011 1744   Morbid obesity: Patient has been on [id number] though she had to stop this 3 weeks ago due to vomiting.  Notes vomiting started on 1.7 mg dose that worsened when she went up to 2.4 mg.  She notes she eats a healthy diet.  Rarely has a soda.  No sweet tea.  Does not eat any sweets for many junk items.  Generally has a healthy breakfast.  Exercise is limited by some bursitis in her hips that has been chronic.  She is also working overtime a significant amount at work and notes that limits her ability to exercise as well.  Anxiety/depression: Patient notes this is generally mild.  She had a scare earlier this week with her granddaughter not waking up and having to be evaluated at the hospital.  Patient continues on Cymbalta and Wellbutrin.  No SI.  Left wrist pain: Patient notes this has been going on 3 to 4 months and is on the radial aspect.  Notes she has been using something that she freezes in the freezer though it does not seem to be helping.  Social History   Tobacco Use  Smoking Status Never   Smokeless Tobacco Never    Current Outpatient Medications on File Prior to Visit  Medication Sig Dispense Refill   amLODipine (NORVASC) 10 MG tablet TAKE 1 TABLET(10 MG) BY MOUTH DAILY 90 tablet 0   buPROPion (WELLBUTRIN XL) 150 MG 24 hr tablet TAKE 1 TABLET(150 MG) BY MOUTH DAILY 90 tablet 1   DULoxetine (CYMBALTA) 60 MG capsule Take 1 capsule (60 mg total) by mouth daily. 90 capsule 3   ipratropium (ATROVENT) 0.06 % nasal spray USE 2 SPRAY(S) IN EACH NOSTRIL 3 TO 4 TIMES DAILY AS NEEDED FOR ALLERGIES     Multiple Vitamin (MULTIVITAMIN) capsule Take 1 capsule by mouth daily.     omeprazole (PRILOSEC) 20 MG capsule TAKE 1 CAPSULE(20 MG) BY MOUTH DAILY 90 capsule 1   [DISCONTINUED] promethazine (PHENERGAN) 25 MG suppository Place 1 suppository (25 mg total) rectally every 6 (six) hours as needed for nausea. (Patient not taking: Reported on 08/10/2018) 12 suppository 1   No current facility-administered medications on file prior to visit.     ROS see history of present illness  Objective  Physical Exam Vitals:   09/23/22 1147  BP: 128/84  Pulse: 85  Temp: 98.2 F (36.8 C)  SpO2: 99%    BP Readings from Last 3 Encounters:  09/23/22 128/84  02/01/22 (!) 152/82  12/31/21 130/70   Wt Readings from Last 3 Encounters:  09/23/22 222 lb (100.7 kg)  02/01/22 244 lb 9.6 oz (110.9 kg)  12/31/21 246 lb 3.2 oz (111.7 kg)    Physical Exam Constitutional:      General: She is not in acute distress.    Appearance: She is not diaphoretic.  Cardiovascular:     Rate and Rhythm: Normal rate and regular rhythm.     Heart sounds: Normal heart sounds.  Pulmonary:     Effort: Pulmonary effort is normal.     Breath sounds: Normal breath sounds.  Musculoskeletal:     Comments: Positive Finkelstein's on the left, has tenderness over the tendon along the radial aspect of her left wrist  Skin:    General: Skin is warm and dry.  Neurological:     Mental Status: She is alert.       Assessment/Plan: Please see individual problem list.  Hypertension, unspecified type Assessment & Plan: Chronic issue.  Adequately controlled.  She will continue amlodipine 10 mg daily.  I encouraged her to start checking her blood pressure at home.   Morbid obesity (HCC) Assessment & Plan: Chronic issue.  Weight has trended down with [facility] though she has had to come off of this related to side effects.  Discussed that we could try zepbound though if she had vomiting or excessive nausea on this she would need to discontinue the medication.  She understands that the risk of side effects and adverse events is generally similar between this and Wegovy.  She will monitor her thyroid area for any changes.  She will seek medical attention if she develops abdominal pain.  We will follow-up in 3 months on this.  I did encourage her to get back into exercise.  Orders: -     Zepbound; Inject 2.5 mg into the skin once a week for 28 days.  Dispense: 2 mL; Refill: 0 -     Zepbound; Inject 5 mg into the skin once a week. Start after finishing the 2.5 mg dose.  Dispense: 6 mL; Refill: 1  De Quervain's tenosynovitis, left Assessment & Plan: Encouraged use of actual ice on the area.  Will refer to orthopedics for further evaluation.  Orders: -     Ambulatory referral to Orthopedic Surgery  Anxiety and depression Assessment & Plan: Generally stable.  She will continue Cymbalta 60 mg daily and Wellbutrin 150 mg daily.     Return in about 3 months (around 12/24/2022).   Marikay Alar, MD [facility]

## 2022-09-23 NOTE — Assessment & Plan Note (Signed)
Chronic issue.  Adequately controlled.  She will continue amlodipine 10 mg daily.  I encouraged her to start checking her blood pressure at home.

## 2022-09-23 NOTE — Assessment & Plan Note (Signed)
Chronic issue.  Weight has trended down with [facility] though she has had to come off of this related to side effects.  Discussed that we could try zepbound though if she had vomiting or excessive nausea on this she would need to discontinue the medication.  She understands that the risk of side effects and adverse events is generally similar between this and Wegovy.  She will monitor her thyroid area for any changes.  She will seek medical attention if she develops abdominal pain.  We will follow-up in 3 months on this.  I did encourage her to get back into exercise.

## 2022-09-23 NOTE — Assessment & Plan Note (Signed)
Encouraged use of actual ice on the area.  Will refer to orthopedics for further evaluation.

## 2022-09-23 NOTE — Assessment & Plan Note (Signed)
Generally stable.  She will continue Cymbalta 60 mg daily and Wellbutrin 150 mg daily.

## 2022-09-30 ENCOUNTER — Other Ambulatory Visit (HOSPITAL_COMMUNITY): Payer: Self-pay

## 2022-09-30 ENCOUNTER — Encounter: Payer: Self-pay | Admitting: Family Medicine

## 2022-11-14 ENCOUNTER — Other Ambulatory Visit: Payer: Self-pay | Admitting: Family Medicine

## 2022-11-14 DIAGNOSIS — I10 Essential (primary) hypertension: Secondary | ICD-10-CM

## 2022-11-27 ENCOUNTER — Other Ambulatory Visit: Payer: Self-pay | Admitting: Family Medicine

## 2022-11-28 ENCOUNTER — Other Ambulatory Visit: Payer: Self-pay

## 2022-11-28 ENCOUNTER — Encounter: Payer: Self-pay | Admitting: Family Medicine

## 2022-11-28 MED ORDER — BUPROPION HCL ER (XL) 150 MG PO TB24: ORAL_TABLET | ORAL | 1 refills | Status: DC

## 2022-11-29 NOTE — Telephone Encounter (Signed)
Duplicate request. Is it okay to refuse?

## 2023-02-21 ENCOUNTER — Other Ambulatory Visit: Payer: Self-pay | Admitting: Family Medicine

## 2023-02-21 ENCOUNTER — Encounter: Payer: Self-pay | Admitting: Family Medicine

## 2023-02-21 DIAGNOSIS — F419 Anxiety disorder, unspecified: Secondary | ICD-10-CM

## 2023-02-21 MED ORDER — BUPROPION HCL ER (XL) 150 MG PO TB24: ORAL_TABLET | ORAL | 1 refills | Status: DC

## 2023-02-21 MED ORDER — DULOXETINE HCL 60 MG PO CPEP
60.0000 mg | ORAL_CAPSULE | Freq: Every day | ORAL | 3 refills | Status: DC
Start: 2023-02-21 — End: 2023-11-28

## 2023-05-26 ENCOUNTER — Telehealth: Payer: Self-pay | Admitting: Family Medicine

## 2023-05-26 NOTE — Telephone Encounter (Signed)
 Dr Birdie Sons is no longer at this location. Please call the office to schedule a Transfer of Care to either Dr Charlann Lange, Darleen Crocker or Kara Dies, NP. Purvis Sheffield please schedule [facility] appointment for this patient. [facility]   Thank you

## 2023-11-23 ENCOUNTER — Telehealth: Payer: Self-pay

## 2023-11-23 NOTE — Telephone Encounter (Signed)
 Noted

## 2023-11-23 NOTE — Telephone Encounter (Signed)
 Received Buspar refill request last office visit was with Dr. Maribeth in 7/24 and next office visit for [facility] 1/26 so I scheduled Patient for a medication refill appointment on 11/28/23.

## 2023-11-28 ENCOUNTER — Encounter: Payer: Self-pay | Admitting: Nurse Practitioner

## 2023-11-28 ENCOUNTER — Ambulatory Visit: Admitting: Nurse Practitioner

## 2023-11-28 DIAGNOSIS — I10 Essential (primary) hypertension: Secondary | ICD-10-CM

## 2023-11-28 DIAGNOSIS — F32A Depression, unspecified: Secondary | ICD-10-CM

## 2023-11-28 DIAGNOSIS — F419 Anxiety disorder, unspecified: Secondary | ICD-10-CM | POA: Diagnosis not present

## 2023-11-28 MED ORDER — BUPROPION HCL ER (XL) 150 MG PO TB24: ORAL_TABLET | ORAL | 1 refills | Status: DC

## 2023-11-28 MED ORDER — DULOXETINE HCL 60 MG PO CPEP: 60.0000 mg | ORAL_CAPSULE | Freq: Every day | ORAL | 3 refills | Status: AC

## 2023-11-28 MED ORDER — AMLODIPINE BESYLATE 10 MG PO TABS: 10.0000 mg | ORAL_TABLET | Freq: Every day | ORAL | 1 refills | Status: DC

## 2023-11-28 NOTE — Patient Instructions (Signed)
 Please monitor your blood pressure 2-3 times a week, make a log and bring it to nurse visit in a month.

## 2023-11-28 NOTE — Progress Notes (Signed)
 Established Patient Office Visit  Subjective:  Patient ID: Anne James, female    DOB: 1970-06-05  Age: 53 y.o. MRN: [id number]  CC:  Chief Complaint  Patient presents with   Medication Refill   Discussed the use of a AI scribe software for clinical note transcription with the patient, who gave verbal consent to proceed.  HPI  Anne James is a 53 year old female with hypertension and anxiety/depression who presents for medication refills.  She takes Wellbutrin  and Cymbalta  for anxiety and depression, which are well-controlled. She has not started Zepbound  due to insurance issues. She has been out of amlodipine  for four months but has not experienced headaches or dizziness. Life circumstances have affected her ability to manage her diet and stress.  She underwent gastric bypass surgery 23 years ago, resulting in a significant weight loss. Since her knee replacement surgery in 2017, she has gained 55 pounds, attributed to stress eating. She has been on Wegovy  for weight management for almost two years, losing about 30 pounds.  She works from home, which contributes to her snacking habits. 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 No periods since 2006.   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    Past Surgical History:  Procedure  Laterality Date   CHOLECYSTECTOMY N/A 09/18/2016   Procedure: LAPAROSCOPIC CHOLECYSTECTOMY;  Surgeon: Jordis Laneta FALCON, MD;  Location: [facility] ORS;  Service: General;  Laterality: N/A;   EPIGASTRIC HERNIA REPAIR  02/02/2018   Procedure: INTERNAL HERNIA  REDUCTION;  Surgeon: Jordis Laneta FALCON, MD;  Location: [facility] ORS;  Service: General;;   ESOPHAGOGASTRODUODENOSCOPY (EGD) WITH PROPOFOL  N/A 02/02/2018   Procedure: ESOPHAGOGASTRODUODENOSCOPY (EGD) WITH PROPOFOL ;  Surgeon: Toledo, Ladell POUR, MD;  Location: [facility] ENDOSCOPY;  Service: Gastroenterology;  Laterality: N/A;   GASTRIC BYPASS  2002   [facility], Dr. Lorrene   LAPAROSCOPY N/A 02/02/2018   Procedure: LAPAROSCOPY DIAGNOSTIC CONVERTED TO OPEN;  Surgeon: Jordis Laneta FALCON, MD;  Location: [facility] ORS;  Service: General;  Laterality: N/A;   NOVASURE ABLATION     TOTAL KNEE ARTHROPLASTY Left 06/29/2015   Procedure: LEFT TOTAL KNEE ARTHROPLASTY;  Surgeon: Lamar Millman, MD;  Location: [facility] OR;  Service: Orthopedics;  Laterality: Left;   TOTAL KNEE ARTHROPLASTY Right 01/18/2016   Procedure: TOTAL KNEE ARTHROPLASTY;  Surgeon: Lamar Millman, MD;  Location: [facility] OR;  Service: Orthopedics;  Laterality: Right;   TUBAL LIGATION      Family History  Problem Relation Age of Onset   Alcohol abuse Mother    Drug abuse Mother    CAD Mother    Arthritis Maternal Grandmother    Heart disease Maternal Grandmother    Stroke Maternal Grandmother    Hypertension Maternal  Grandmother    Kidney disease Maternal Grandmother    Diabetes Maternal Grandmother    Anxiety disorder Brother    Hypertension Father     Social History   Socioeconomic History   Marital status: Married    Spouse name: Not on file   Number of children: Not on file   Years of education: Not on file   Highest education level: Not on file  Occupational History   Not on file  Tobacco Use   Smoking status: Never   Smokeless tobacco: Never  Vaping Use   Vaping status: Never Used  Substance and  Sexual Activity   Alcohol use: Not Currently   Drug use: No   Sexual activity: Not on file    Comment: LMP 2006 following endometrial ablation  Other Topics Concern   Not on file  Social History Narrative   Lives in [place]      Work - [employer]   Diet - Regular    Exercise - limited by right knee pain   Social Drivers of Corporate investment banker Strain: Not on file  Food Insecurity: Not on file  Transportation Needs: Not on file  Physical Activity: Not on file  Stress: Not on file  Social Connections: Not on file  Intimate Partner Violence: Not on file     Outpatient Medications Prior to Visit  Medication Sig Dispense Refill   Multiple Vitamin (MULTIVITAMIN) capsule Take 1 capsule by mouth daily.     omeprazole  (PRILOSEC) 20 MG capsule TAKE 1 CAPSULE(20 MG) BY MOUTH DAILY 90 capsule 1   DULoxetine  (CYMBALTA ) 60 MG capsule Take 1 capsule (60 mg total) by mouth daily. 90 capsule 3   ipratropium (ATROVENT) 0.06 % nasal spray USE 2 SPRAY(S) IN EACH NOSTRIL 3 TO 4 TIMES DAILY AS NEEDED FOR ALLERGIES (Patient not taking: Reported on 11/28/2023)     tirzepatide  (ZEPBOUND ) 5 MG/0.5ML Pen Inject 5 mg into the skin once a week. Start after finishing the 2.5 mg dose. (Patient not taking: Reported on 11/28/2023) 6 mL 1   amLODipine  (NORVASC ) 10 MG tablet TAKE 1 TABLET(10 MG) BY MOUTH DAILY 90 tablet 1   buPROPion  (WELLBUTRIN  XL) 150 MG 24 hr tablet TAKE 1 TABLET(150 MG) BY MOUTH DAILY 90 tablet 1   No facility-administered medications prior to visit.    No Known Allergies  ROS Review of Systems Negative unless indicated in HPI.    Objective:    Physical Exam Constitutional:      Appearance: Normal appearance.  HENT:     Mouth/Throat:     Mouth: Mucous membranes are moist.  Eyes:     Conjunctiva/sclera: Conjunctivae normal.     Pupils: Pupils are equal, round, and reactive to light.  Cardiovascular:     Rate and Rhythm: Normal rate and regular rhythm.     Pulses: Normal  pulses.     Heart sounds: Normal heart sounds.  Pulmonary:     Effort: Pulmonary effort is normal.     Breath sounds: Normal breath sounds.  Musculoskeletal:     Cervical back: Normal range of motion. No tenderness.  Skin:    General: Skin is warm.     Findings: No bruising.  Neurological:     General: No focal deficit present.     Mental Status: She is alert and oriented to person, place, and time. Mental status is at baseline.  Psychiatric:        Mood and Affect: Mood normal.  Behavior: Behavior normal.        Thought Content: Thought content normal.        Judgment: Judgment normal.     BP (!) 152/90   Pulse 65   Temp 97.7 F (36.5 C)   Ht 5' 5 (1.651 m)   Wt 245 lb 12.8 oz (111.5 kg)   SpO2 97%   BMI 40.90 kg/m  Wt Readings from Last 3 Encounters:  11/28/23 245 lb 12.8 oz (111.5 kg)  09/23/22 222 lb (100.7 kg)  02/01/22 244 lb 9.6 oz (110.9 kg)     Health Maintenance  Topic Date Due   Hepatitis B Vaccines 19-59 Average Risk (1 of 3 - 19+ 3-dose series) Never done   Pneumococcal Vaccine: 50+ Years (1 of 1 - PCV) Never done   Zoster Vaccines- Shingrix (2 of 2) 12/02/2019   Mammogram  07/14/2023   COVID-19 Vaccine (1 - 2024-25 season) 12/13/2023 (Originally 11/13/2023)   Influenza Vaccine  06/11/2024 (Originally 10/13/2023)   Cervical Cancer Screening (HPV/Pap Cotest)  10/06/2024   DTaP/Tdap/Td (3 - Td or Tdap) 10/06/2029   Colonoscopy  06/05/2032   Hepatitis C Screening  Completed   HIV Screening  Completed   HPV VACCINES  Aged Out   Meningococcal B Vaccine  Aged Out       Topic Date Due   Hepatitis B Vaccines 19-59 Average Risk (1 of 3 - 19+ 3-dose series) Never done    Lab Results  Component Value Date   TSH 2.840 11/28/2023   Lab Results  Component Value Date   WBC 6.8 02/01/2022   HGB 13.7 02/01/2022   HCT 41.3 02/01/2022   MCV 88 02/01/2022   PLT 370 02/01/2022   Lab Results  Component Value Date   NA 137 11/28/2023   K 4.6  11/28/2023   CO2 21 11/28/2023   GLUCOSE 97 11/28/2023   BUN 20 11/28/2023   CREATININE 0.88 11/28/2023   BILITOT <0.2 11/28/2023   ALKPHOS 109 11/28/2023   AST 23 11/28/2023   ALT 16 11/28/2023   PROT 6.4 11/28/2023   ALBUMIN 4.2 11/28/2023   CALCIUM 9.2 11/28/2023   ANIONGAP 14 02/22/2018   EGFR 78 11/28/2023   GFR 91.14 10/09/2014   Lab Results  Component Value Date   CHOL 259 (H) 11/28/2023   Lab Results  Component Value Date   HDL 120 11/28/2023   Lab Results  Component Value Date   LDLCALC 123 (H) 11/28/2023   Lab Results  Component Value Date   TRIG 97 11/28/2023   Lab Results  Component Value Date   CHOLHDL 2.2 11/28/2023   Lab Results  Component Value Date   HGBA1C 5.2 02/01/2022      Assessment & Plan:  Morbid obesity (HCC) Assessment & Plan: Weight gain post-knee replacement despite previous bariatric surgery and Wegovy . Stress eating and snacking noted. Zepbound  not approved by insurance. - Referred to medical weight management program in [place] for dietary guidance and calorie deficit planning  Orders: -     Amb Ref to Medical Weight Management  Hypertension, unspecified type Assessment & Plan: Uncontrolled due to non-adherence to amlodipine . Possible dietary and stress contributions. - Refilled amlodipine  prescription. - Advised reduction in salt intake. - Instructed to monitor blood pressure at home and report readings in two weeks.  Orders: -     amLODIPine  Besylate; Take 1 tablet (10 mg total) by mouth daily.  Dispense: 90 tablet; Refill: 1 -     Comprehensive metabolic  panel with GFR -     Lipid panel  Anxiety and depression Assessment & Plan: Well-controlled with Wellbutrin  and Cymbalta . Symptoms stable with medication adherence. - Continue Wellbutrin  and Cymbalta  as prescribed.   Orders: -     DULoxetine  HCl; Take 1 capsule (60 mg total) by mouth daily.  Dispense: 90 capsule; Refill: 3 -     TSH  Other orders -      buPROPion  HCl ER (XL); TAKE 1 TABLET(150 MG) BY MOUTH DAILY  Dispense: 90 tablet; Refill: 1    Follow-up: Return in about 1 month (around 12/28/2023) for nurse visit.   Steed Kanaan, NP

## 2023-11-29 LAB — COMPREHENSIVE METABOLIC PANEL WITH GFR
ALT: 16 IU/L (ref 0–32)
AST: 23 IU/L (ref 0–40)
Albumin: 4.2 g/dL (ref 3.8–4.9)
Alkaline Phosphatase: 109 IU/L (ref 49–135)
BUN/Creatinine Ratio: 23 (ref 9–23)
BUN: 20 mg/dL (ref 6–24)
Bilirubin Total: <0.2 mg/dL (ref 0.0–1.2)
CO2: 21 mmol/L (ref 20–29)
Calcium: 9.2 mg/dL (ref 8.7–10.2)
Chloride: 101 mmol/L (ref 96–106)
Creatinine, Ser: 0.88 mg/dL (ref 0.57–1.00)
Globulin, Total: 2.2 g/dL (ref 1.5–4.5)
Glucose: 97 mg/dL (ref 70–99)
Potassium: 4.6 mmol/L (ref 3.5–5.2)
Sodium: 137 mmol/L (ref 134–144)
Total Protein: 6.4 g/dL (ref 6.0–8.5)
eGFR: 78 mL/min/1.73 (ref >59)

## 2023-11-29 LAB — TSH: TSH: 2.84 u[IU]/mL (ref 0.450–4.500)

## 2023-11-29 LAB — LIPID PANEL
Chol/HDL Ratio: 2.2 ratio (ref 0.0–4.4)
Cholesterol, Total: 259 mg/dL — ABNORMAL HIGH (ref 100–199)
HDL: 120 mg/dL (ref >39)
LDL Chol Calc (NIH): 123 mg/dL — ABNORMAL HIGH (ref 0–99)
Triglycerides: 97 mg/dL (ref 0–149)
VLDL Cholesterol Cal: 16 mg/dL (ref 5–40)

## 2023-12-12 ENCOUNTER — Ambulatory Visit: Payer: Self-pay | Admitting: Nurse Practitioner

## 2023-12-12 NOTE — Assessment & Plan Note (Signed)
 Weight gain post-knee replacement despite previous bariatric surgery and Wegovy . Stress eating and snacking noted. Zepbound  not approved by insurance. - Referred to medical weight management program in [place] for dietary guidance and calorie deficit planning

## 2023-12-12 NOTE — Assessment & Plan Note (Signed)
 Uncontrolled due to non-adherence to amlodipine . Possible dietary and stress contributions. - Refilled amlodipine  prescription. - Advised reduction in salt intake. - Instructed to monitor blood pressure at home and report readings in two weeks.

## 2023-12-12 NOTE — Assessment & Plan Note (Signed)
 Well-controlled with Wellbutrin  and Cymbalta . Symptoms stable with medication adherence. - Continue Wellbutrin  and Cymbalta  as prescribed.

## 2023-12-28 ENCOUNTER — Ambulatory Visit

## 2024-01-11 ENCOUNTER — Ambulatory Visit: Admitting: Nurse Practitioner

## 2024-01-11 VITALS — BP 128/80 | HR 76 | Temp 97.3°F | Ht 65.0 in | Wt 244.2 lb

## 2024-01-11 DIAGNOSIS — E785 Hyperlipidemia, unspecified: Secondary | ICD-10-CM | POA: Diagnosis not present

## 2024-01-11 DIAGNOSIS — Z23 Encounter for immunization: Secondary | ICD-10-CM

## 2024-01-11 DIAGNOSIS — F32A Depression, unspecified: Secondary | ICD-10-CM

## 2024-01-11 DIAGNOSIS — I10 Essential (primary) hypertension: Secondary | ICD-10-CM

## 2024-01-11 DIAGNOSIS — F419 Anxiety disorder, unspecified: Secondary | ICD-10-CM | POA: Diagnosis not present

## 2024-01-11 DIAGNOSIS — Z136 Encounter for screening for cardiovascular disorders: Secondary | ICD-10-CM

## 2024-01-11 DIAGNOSIS — Z6841 Body Mass Index (BMI) 40.0 and over, adult: Secondary | ICD-10-CM

## 2024-01-11 MED ORDER — TIRZEPATIDE-WEIGHT MANAGEMENT 2.5 MG/0.5ML ~~LOC~~ SOLN: 2.5000 mg | SUBCUTANEOUS | 3 refills | Status: AC

## 2024-01-11 MED ORDER — WEGOVY 0.25 MG/0.5ML ~~LOC~~ SOAJ: 0.2500 mg | SUBCUTANEOUS | 2 refills | Status: DC

## 2024-01-11 NOTE — Assessment & Plan Note (Addendum)
 Elevated cholesterol with family history of heart disease. No significant dietary changes due to stress eating. Discussed CT cardiac score option. - Recheck cholesterol levels in 3 months. - Encourage dietary modifications to lower cholesterol. - Order CT cardiac score to assess calcium deposits in arteries.  Orders:   Lipid panel; Future

## 2024-01-11 NOTE — Assessment & Plan Note (Addendum)
 Body mass index is 40.64 kg/m. Significant weight loss post-surgery. Zepbound  not approved by insurance, available through [facility]. - Send prescription to [facility] for Zepbound . - Discuss out-of-pocket costs for Zepbound .  Orders:   tirzepatide  (ZEPBOUND ) 2.5 MG/0.5ML injection vial; Inject 2.5 mg into the skin once a week.

## 2024-01-11 NOTE — Assessment & Plan Note (Addendum)
 Patient BP  Vitals:   01/11/24 1312  BP: 128/80  -Advised pt to follow a low sodium and heart healthy diet. -Continue amlodipine  5 mg daily.

## 2024-01-11 NOTE — Assessment & Plan Note (Addendum)
 Managed with Wellbutrin  and Cymbalta . Symptoms controlled despite recent stressors. - Continue Wellbutrin  and Cymbalta .

## 2024-01-11 NOTE — Progress Notes (Signed)
 Established Patient Office Visit  Subjective:  Patient ID: Anne James, female    DOB: Jun 15, 1970  Age: 53 y.o. MRN: [id number]  CC:  Chief Complaint  Patient presents with   Establish Care    Transfer of Care   Discussed the use of AI scribe software for clinical note transcription with the patient, who gave verbal consent to proceed.  History of Present Illness Anne James is a 53 year old female who presents to establish care.  She is managing her blood pressure with amlodipine , with a recent reading of 128/80 mmHg, but finds consistent monitoring challenging.   Her anxiety and depression are controlled with Wellbutrin  and Cymbalta , though recent stress from her mother's health issues and family responsibilities has been overwhelming.   She is concerned about weight gain and identifies as a stress eater. She has not made significant dietary changes. She underwent bariatric surgery in 2002, losing 187 pounds. She previously used wegovy  for weight loss and weight has trended down but she had experienced side effects and had to come off from wegovy . She would like to try zepbound .    She has a history of iron  deficiency anemia treated with infusions in 2021. She underwent bilateral knee replacements in 2017 and experiences occasional coordination issues but no significant pain. She reports worsening bursitis in her right hip and left shoulder pain after prolonged use, which improves with rest and ibuprofen.   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 No periods since 2006.   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    Past Surgical History:  Procedure Laterality Date   CESAREAN SECTION  07/09/1987   had daughter   CHOLECYSTECTOMY N/A 09/18/2016   Procedure: LAPAROSCOPIC CHOLECYSTECTOMY;  Surgeon: Jordis Laneta FALCON, MD;  Location: [facility] ORS;  Service: General;  Laterality: N/A;   EPIGASTRIC HERNIA REPAIR  02/02/2018   Procedure: INTERNAL HERNIA  REDUCTION;  Surgeon: Jordis Laneta FALCON, MD;  Location: [facility];  Service: General;;   ESOPHAGOGASTRODUODENOSCOPY (EGD) WITH PROPOFOL  N/A 02/02/2018   Procedure: ESOPHAGOGASTRODUODENOSCOPY (EGD) WITH PROPOFOL ;  Surgeon: Toledo, Ladell POUR, MD;  Location: [facility];  Service: Gastroenterology;  Laterality: N/A;   GASTRIC BYPASS  03/14/2000   [facility], Dr. Lorrene   HERNIA REPAIR  2019   hernia in intestines   JOINT REPLACEMENT  2017   both knees replaced   LAPAROSCOPY N/A 02/02/2018   Procedure: LAPAROSCOPY DIAGNOSTIC CONVERTED TO OPEN;  Surgeon: Jordis Laneta FALCON, MD;  Location: [facility];  Service: General;  Laterality: N/A;   NOVASURE ABLATION     SMALL INTESTINE SURGERY  2002   Gastric Bypass surgery and they rerouted my intestines   TOTAL KNEE ARTHROPLASTY Left 06/29/2015   Procedure: LEFT TOTAL KNEE ARTHROPLASTY;  Surgeon: Lamar Millman, MD;  Location: [facility] OR;  Service: Orthopedics;  Laterality: Left;   TOTAL KNEE ARTHROPLASTY Right 01/18/2016   Procedure: TOTAL KNEE ARTHROPLASTY;  Surgeon: Lamar Millman, MD;  Location: [facility] OR;  Service: Orthopedics;  Laterality: Right;   TUBAL LIGATION  2006    Family History  Problem Relation Age of Onset   Alcohol abuse Mother    Drug abuse Mother    CAD Mother    Hypertension Father    Anxiety disorder Brother    Arthritis Maternal Grandmother    Heart disease Maternal Grandmother     Stroke Maternal Grandmother    Hypertension Maternal Grandmother    Kidney disease Maternal Grandmother    Diabetes Maternal Grandmother    Colon cancer Neg Hx    Breast cancer Neg Hx     Social History   Socioeconomic History   Marital status: Married    Spouse name: Not on file   Number of children: Not on file   Years of education: Not on file   Highest education level: 12th grade  Occupational History   Not on file  Tobacco Use   Smoking status: Never   Smokeless tobacco: Never  Vaping Use   Vaping status: Never Used  Substance and Sexual Activity   Alcohol use: Not Currently   Drug use: No   Sexual activity: Not on file    Comment: LMP 2006 following endometrial ablation  Other Topics Concern   Not on file  Social History Narrative   Lives in [place].      Work - [employer]   Diet - Regular    Exercise - limited by right knee pain   Social Drivers of Health   Financial Resource Strain: Low Risk  (01/11/2024)   Overall Financial Resource Strain (CARDIA)    Difficulty of Paying Living Expenses: Not very hard  Food Insecurity: No Food Insecurity (01/11/2024)   Hunger Vital Sign    Worried About Running Out of Food in the Last Year: Never true    Ran Out of Food in the Last Year: Never true  Transportation Needs: No Transportation Needs (01/11/2024)   PRAPARE - Administrator, Civil Service (Medical): No    Lack of Transportation (Non-Medical): No  Physical Activity: Inactive (01/11/2024)   Exercise Vital Sign    Days of Exercise per Week: 0 days    Minutes of Exercise per Session: Not on file  Stress: Stress Concern Present (01/11/2024)   [employer] of Occupational Health - Occupational Stress Questionnaire    Feeling of Stress: To some extent  Social Connections: Moderately Integrated (01/11/2024)   Social Connection and Isolation Panel    Frequency of Communication with Friends and Family: More than three times a week    Frequency of  Social Gatherings with Friends and Family: Twice a week    Attends Religious Services: 1 to 4 times per year    Active Member of [employer] or Organizations: No    Attends Engineer, Structural: Not on file    Marital Status: Married  Catering Manager Violence: Not on file     Outpatient Medications Prior to Visit  Medication Sig Dispense Refill   amLODipine  (NORVASC ) 10 MG tablet Take 1 tablet (10 mg total) by mouth daily. 90 tablet 1   buPROPion  (WELLBUTRIN  XL) 150 MG 24 hr tablet TAKE 1 TABLET(150 MG) BY MOUTH DAILY 90 tablet 1   DULoxetine  (CYMBALTA ) 60 MG capsule Take 1 capsule (60 mg total) by mouth daily. 90 capsule 3  Multiple Vitamin (MULTIVITAMIN) capsule Take 1 capsule by mouth daily.     ipratropium (ATROVENT) 0.06 % nasal spray USE 2 SPRAY(S) IN EACH NOSTRIL 3 TO 4 TIMES DAILY AS NEEDED FOR ALLERGIES (Patient not taking: Reported on 11/28/2023)     omeprazole  (PRILOSEC) 20 MG capsule TAKE 1 CAPSULE(20 MG) BY MOUTH DAILY 90 capsule 1   tirzepatide  (ZEPBOUND ) 5 MG/0.5ML Pen Inject 5 mg into the skin once a week. Start after finishing the 2.5 mg dose. (Patient not taking: Reported on 11/28/2023) 6 mL 1   No facility-administered medications prior to visit.    No Known Allergies  ROS Review of Systems Negative unless indicated in HPI.    Objective:    Physical Exam Constitutional:      Appearance: Normal appearance.  HENT:     Mouth/Throat:     Mouth: Mucous membranes are moist.  Eyes:     Conjunctiva/sclera: Conjunctivae normal.     Pupils: Pupils are equal, round, and reactive to light.  Cardiovascular:     Rate and Rhythm: Normal rate and regular rhythm.     Pulses: Normal pulses.     Heart sounds: Normal heart sounds.  Pulmonary:     Effort: Pulmonary effort is normal.     Breath sounds: Normal breath sounds.  Abdominal:     General: Bowel sounds are normal.     Palpations: Abdomen is soft. There is no mass.     Tenderness: There is no guarding.   Musculoskeletal:     Cervical back: Normal range of motion. No tenderness.  Skin:    General: Skin is warm.     Findings: No bruising.  Neurological:     General: No focal deficit present.     Mental Status: She is alert and oriented to person, place, and time. Mental status is at baseline.  Psychiatric:        Mood and Affect: Mood normal.        Behavior: Behavior normal.        Thought Content: Thought content normal.        Judgment: Judgment normal.     BP 128/80   Pulse 76   Temp (!) 97.3 F (36.3 C)   Ht 5' 5 (1.651 m)   Wt 244 lb 3.2 oz (110.8 kg)   SpO2 97%   BMI 40.64 kg/m  Wt Readings from Last 3 Encounters:  01/11/24 244 lb 3.2 oz (110.8 kg)  11/28/23 245 lb 12.8 oz (111.5 kg)  09/23/22 222 lb (100.7 kg)     Health Maintenance  Topic Date Due   Hepatitis B Vaccines 19-59 Average Risk (1 of 3 - 19+ 3-dose series) Never done   Zoster Vaccines- Shingrix (2 of 2) 12/02/2019   Mammogram  07/14/2023   COVID-19 Vaccine (1 - 2025-26 season) Never done   Cervical Cancer Screening (HPV/Pap Cotest)  10/06/2024   DTaP/Tdap/Td (3 - Td or Tdap) 10/06/2029   Colonoscopy  06/05/2032   Pneumococcal Vaccine: 50+ Years  Completed   Influenza Vaccine  Completed   Hepatitis C Screening  Completed   HIV Screening  Completed   HPV VACCINES  Aged Out   Meningococcal B Vaccine  Aged Out       Topic Date Due   Hepatitis B Vaccines 19-59 Average Risk (1 of 3 - 19+ 3-dose series) Never done    Lab Results  Component Value Date   TSH 2.840 11/28/2023   Lab Results  Component Value Date  WBC 6.8 02/01/2022   HGB 13.7 02/01/2022   HCT 41.3 02/01/2022   MCV 88 02/01/2022   PLT 370 02/01/2022   Lab Results  Component Value Date   NA 137 11/28/2023   K 4.6 11/28/2023   CO2 21 11/28/2023   GLUCOSE 97 11/28/2023   BUN 20 11/28/2023   CREATININE 0.88 11/28/2023   BILITOT <0.2 11/28/2023   ALKPHOS 109 11/28/2023   AST 23 11/28/2023   ALT 16 11/28/2023   PROT  6.4 11/28/2023   ALBUMIN 4.2 11/28/2023   CALCIUM 9.2 11/28/2023   ANIONGAP 14 02/22/2018   EGFR 78 11/28/2023   GFR 91.14 10/09/2014   Lab Results  Component Value Date   CHOL 259 (H) 11/28/2023   Lab Results  Component Value Date   HDL 120 11/28/2023   Lab Results  Component Value Date   LDLCALC 123 (H) 11/28/2023   Lab Results  Component Value Date   TRIG 97 11/28/2023   Lab Results  Component Value Date   CHOLHDL 2.2 11/28/2023   Lab Results  Component Value Date   HGBA1C 5.2 02/01/2022      Assessment & Plan:   Assessment & Plan Encounter for screening for coronary artery disease  Orders:   CT CARDIAC SCORING ([facility] LOCATIONS ONLY); Future  Morbid obesity (HCC) Body mass index is 40.64 kg/m. Significant weight loss post-surgery. Zepbound  not approved by insurance, available through [facility]. - Send prescription to [facility] for Zepbound . - Discuss out-of-pocket costs for Zepbound .  Orders:   tirzepatide  (ZEPBOUND ) 2.5 MG/0.5ML injection vial; Inject 2.5 mg into the skin once a week.  Hyperlipidemia, unspecified hyperlipidemia type Elevated cholesterol with family history of heart disease. No significant dietary changes due to stress eating. Discussed CT cardiac score option. - Recheck cholesterol levels in 3 months. - Encourage dietary modifications to lower cholesterol. - Order CT cardiac score to assess calcium deposits in arteries.  Orders:   Lipid panel; Future  Anxiety and depression Managed with Wellbutrin  and Cymbalta . Symptoms controlled despite recent stressors. - Continue Wellbutrin  and Cymbalta .     Hypertension, unspecified type Patient BP  Vitals:   01/11/24 1312  BP: 128/80  -Advised pt to follow a low sodium and heart healthy diet. -Continue amlodipine  5 mg daily.   Immunization due  Orders:   Flu vaccine trivalent PF, 6mos and older(Flulaval,Afluria,Fluarix,Fluzone)   Pneumococcal  conjugate vaccine 20-valent    Assessment & Plan  General Health Maintenance - Administer flu and pneumonia vaccines today. - Order mammogram. - Schedule shingles vaccine.      Follow-up: Return in about 3 months (around 04/12/2024) for follow up with fasting lab 2 days prior, chronic management.   Marcha Licklider, NP

## 2024-01-17 ENCOUNTER — Other Ambulatory Visit

## 2024-01-17 ENCOUNTER — Encounter: Payer: Self-pay | Admitting: Oncology

## 2024-02-22 ENCOUNTER — Encounter: Payer: Self-pay | Admitting: Oncology

## 2024-02-22 ENCOUNTER — Ambulatory Visit: Admitting: Nurse Practitioner

## 2024-02-22 ENCOUNTER — Encounter: Payer: Self-pay | Admitting: Nurse Practitioner

## 2024-02-22 VITALS — BP 130/82 | HR 91 | Temp 97.8°F | Ht 65.0 in | Wt 241.0 lb

## 2024-02-22 DIAGNOSIS — J0140 Acute pansinusitis, unspecified: Secondary | ICD-10-CM | POA: Insufficient documentation

## 2024-02-22 MED ORDER — AMOXICILLIN-POT CLAVULANATE 875-125 MG PO TABS: 1.0000 | ORAL_TABLET | Freq: Two times a day (BID) | ORAL | 0 refills | Status: AC

## 2024-02-22 NOTE — Assessment & Plan Note (Addendum)
 Vital signs stable, patient in is no sign of acute distress, nontoxic.  Discussed findings with the patient.   -Will treat with Augmentin  BID x 10 days and  prednisone 20 mg X 5 days. -Increase fluid intake, rest. - Recommend plain Mucinex and allergy medication. -Will let us  know if symptoms not improving   Orders:   amoxicillin -clavulanate (AUGMENTIN ) 875-125 MG tablet; Take 1 tablet by mouth 2 (two) times daily.   predniSONE (DELTASONE) 20 MG tablet; Take 1 tablet (20 mg total) by mouth daily with breakfast for 5 days.

## 2024-02-22 NOTE — Progress Notes (Signed)
 Established Patient Office Visit  Subjective:  Patient ID: Anne James, female    DOB: 03/10/1971  Age: 53 y.o. MRN: [id number]  CC:  Chief Complaint  Patient presents with   Acute Visit    Congestion, drainage, cough, No energy and upper body aches started Monday with a sore throat which is gone now Felt better on 02/21/24 now feels worse   Discussed the use of AI scribe software for clinical note transcription with the patient, who gave verbal consent to proceed.  History of Present Illness   History of Present Illness   Anne James is a 53 year old female who presents with worsening cough and sinus congestion.  Her symptoms began Monday after an outing with her mother and started with throat drainage and a tickle, progressing to severe cough, head fullness, and ear popping.  She has used Benadryl , a 24-hour allergy medication, Nyquil, and DayQuil with minimal relief. Nyquil helps her sleep, but DayQuil, which usually helps, has not been effective.  Current symptoms are persistent cough, congestion, headache, sinus pressure, ear pressure without significant ear pain, fatigue, and increased thirst. She denies shortness of breath and chest pain.  Symptoms briefly improved yesterday but worsened early this morning. She has had close contact with grandchildren who were recently sick with a virus, including a grandson with asthma.  She has not checked her temperature but her husband noted she felt warm this morning. She is worried symptoms are worsening as the weekend approaches and about exposing her medically fragile mother.      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 No periods since 2006.   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    Past Surgical History:  Procedure Laterality Date   CESAREAN SECTION  07/09/1987   had daughter   CHOLECYSTECTOMY N/A 09/18/2016   Procedure: LAPAROSCOPIC CHOLECYSTECTOMY;  Surgeon: Jordis Laneta FALCON, MD;  Location: [facility] OR[facility];  Service: General;  Laterality: N/A;   EPIGASTRIC HERNIA REPAIR  02/02/2018   Procedure: INTERNAL HERNIA  REDUCTION;  Surgeon: Jordis Laneta FALCON, MD;  Location: [facility];  Service: General;;   ESOPHAGOGASTRODUODENOSCOPY (EGD) WITH PROPOFOL  N/A 02/02/2018   Procedure: ESOPHAGOGASTRODUODENOSCOPY (EGD) WITH PROPOFOL ;  Surgeon: Toledo, Ladell POUR, MD;  Location: [facility];  Service: Gastroenterology;  Laterality: N/A;   GASTRIC BYPASS  03/14/2000   [facility], Dr. Lorrene   HERNIA REPAIR  2019   hernia in intestines   JOINT REPLACEMENT  2017   both knees replaced   LAPAROSCOPY N/A 02/02/2018   Procedure: LAPAROSCOPY DIAGNOSTIC CONVERTED TO OPEN;  Surgeon: Jordis Laneta FALCON, MD;  Location: [facility] OR[facility];  Service: General;  Laterality: N/A;   NOVASURE ABLATION     SMALL INTESTINE SURGERY  2002   Gastric Bypass surgery and they rerouted my intestines  TOTAL KNEE ARTHROPLASTY Left 06/29/2015   Procedure: LEFT TOTAL KNEE ARTHROPLASTY;  Surgeon: Lamar Millman, MD;  Location: [facility] OR;  Service: Orthopedics;  Laterality: Left;   TOTAL KNEE ARTHROPLASTY Right 01/18/2016   Procedure: TOTAL KNEE ARTHROPLASTY;  Surgeon: Lamar Millman, MD;  Location: [facility] OR;  Service: Orthopedics;  Laterality: Right;   TUBAL LIGATION  2006    Family History  Problem Relation Age of Onset   Alcohol abuse Mother    Drug abuse Mother    CAD  Mother    Hypertension Father    Anxiety disorder Brother    Arthritis Maternal Grandmother    Heart disease Maternal Grandmother    Stroke Maternal Grandmother    Hypertension Maternal Grandmother    Kidney disease Maternal Grandmother    Diabetes Maternal Grandmother    Colon cancer Neg Hx    Breast cancer Neg Hx     Social History   Socioeconomic History   Marital status: Married    Spouse name: Not on file   Number of children: Not on file   Years of education: Not on file   Highest education level: 12th grade  Occupational History   Not on file  Tobacco Use   Smoking status: Never   Smokeless tobacco: Never  Vaping Use   Vaping status: Never Used  Substance and Sexual Activity   Alcohol use: Not Currently   Drug use: No   Sexual activity: Not on file    Comment: LMP 2006 following endometrial ablation  Other Topics Concern   Not on file  Social History Narrative   Lives in [place].      Work - [employer]   Diet - Regular    Exercise - limited by right knee pain   Social Drivers of Health   Tobacco Use: Low Risk (02/22/2024)   Patient History    Smoking Tobacco Use: Never    Smokeless Tobacco Use: Never    Passive Exposure: Not on file  Financial Resource Strain: Low Risk (01/11/2024)   Overall Financial Resource Strain (CARDIA)    Difficulty of Paying Living Expenses: Not very hard  Food Insecurity: No Food Insecurity (01/11/2024)   Epic    Worried About Programme Researcher, Broadcasting/film/video in the Last Year: Never true    Ran Out of Food in the Last Year: Never true  Transportation Needs: No Transportation Needs (01/11/2024)   Epic    Lack of Transportation (Medical): No    Lack of Transportation (Non-Medical): No  Physical Activity: Inactive (01/11/2024)   Exercise Vital Sign    Days of Exercise per Week: 0 days    Minutes of Exercise per Session: Not on file  Stress: Stress Concern Present (01/11/2024)   [employer] of Occupational Health - Occupational Stress  Questionnaire    Feeling of Stress: To some extent  Social Connections: Moderately Integrated (01/11/2024)   Social Connection and Isolation Panel    Frequency of Communication with Friends and Family: More than three times a week    Frequency of Social Gatherings with Friends and Family: Twice a week    Attends Religious Services: 1 to 4 times per year    Active Member of [employer] or Organizations: No    Attends Banker Meetings: Not on file    Marital Status: Married  Intimate Partner Violence: Not on file  Depression (PHQ2-9): Medium Risk (02/22/2024)   Depression (PHQ2-9)    PHQ-2 Score: 5  Alcohol Screen: Low Risk (01/11/2024)  Alcohol Screen    Last Alcohol Screening Score (AUDIT): 5  Housing: Low Risk (01/11/2024)   Epic    Unable to Pay for Housing in the Last Year: No    Number of Times Moved in the Last Year: 0    Homeless in the Last Year: No  Utilities: Not on file  Health Literacy: Not on file     Outpatient Medications Prior to Visit  Medication Sig Dispense Refill   amLODipine  (NORVASC ) 10 MG tablet Take 1 tablet (10 mg total) by mouth daily. 90 tablet 1   buPROPion  (WELLBUTRIN  XL) 150 MG 24 hr tablet TAKE 1 TABLET(150 MG) BY MOUTH DAILY 90 tablet 1   DULoxetine  (CYMBALTA ) 60 MG capsule Take 1 capsule (60 mg total) by mouth daily. 90 capsule 3   Multiple Vitamin (MULTIVITAMIN) capsule Take 1 capsule by mouth daily.     tirzepatide  (ZEPBOUND ) 2.5 MG/0.5ML injection vial Inject 2.5 mg into the skin once a week. 0.5 mL 3   No facility-administered medications prior to visit.    Allergies[1]  ROS Review of Systems  HENT:  Positive for congestion, sinus pressure and sore throat.   Respiratory:  Positive for cough.   Neurological:  Positive for headaches.   Negative unless indicated in HPI.    Objective:    Physical Exam HENT:     Right Ear: A middle ear effusion is present.     Left Ear: A middle ear effusion is present.     Nose:     Right  Turbinates: Not enlarged.     Left Turbinates: Not enlarged.     Right Sinus: Maxillary sinus tenderness and frontal sinus tenderness present.     Left Sinus: Maxillary sinus tenderness and frontal sinus tenderness present.     Mouth/Throat:     Mouth: Mucous membranes are moist.     Pharynx: Postnasal drip present. No pharyngeal swelling, oropharyngeal exudate or posterior oropharyngeal erythema.     Tonsils: No tonsillar exudate.  Cardiovascular:     Rate and Rhythm: Normal rate and regular rhythm.  Pulmonary:     Effort: Pulmonary effort is normal.     Breath sounds: Normal breath sounds. No stridor. No wheezing.  Neurological:     General: No focal deficit present.     Mental Status: She is oriented to person, place, and time. Mental status is at baseline.  Psychiatric:        Mood and Affect: Mood normal.        Behavior: Behavior normal.        Thought Content: Thought content normal.        Judgment: Judgment normal.     BP 130/82   Pulse 91   Temp 97.8 F (36.6 C)   Ht 5' 5 (1.651 m)   Wt 241 lb (109.3 kg)   SpO2 98%   BMI 40.10 kg/m  Wt Readings from Last 3 Encounters:  02/22/24 241 lb (109.3 kg)  01/11/24 244 lb 3.2 oz (110.8 kg)  11/28/23 245 lb 12.8 oz (111.5 kg)     Health Maintenance  Topic Date Due   Hepatitis B Vaccines 19-59 Average Risk (1 of 3 - 19+ 3-dose series) Never done   Zoster Vaccines- Shingrix (2 of 2) 12/02/2019   Mammogram  07/14/2023   COVID-19 Vaccine (1 - 2025-26 season) Never done   Cervical Cancer Screening (HPV/Pap Cotest)  10/06/2024   DTaP/Tdap/Td (3 - Td or Tdap) 10/06/2029   Colonoscopy  06/05/2032  Pneumococcal Vaccine: 50+ Years  Completed   Influenza Vaccine  Completed   Hepatitis C Screening  Completed   HIV Screening  Completed   HPV VACCINES  Aged Out   Meningococcal B Vaccine  Aged Out       Topic Date Due   Hepatitis B Vaccines 19-59 Average Risk (1 of 3 - 19+ 3-dose series) Never done    Lab Results   Component Value Date   TSH 2.840 11/28/2023   Lab Results  Component Value Date   WBC 6.8 02/01/2022   HGB 13.7 02/01/2022   HCT 41.3 02/01/2022   MCV 88 02/01/2022   PLT 370 02/01/2022   Lab Results  Component Value Date   NA 137 11/28/2023   K 4.6 11/28/2023   CO2 21 11/28/2023   GLUCOSE 97 11/28/2023   BUN 20 11/28/2023   CREATININE 0.88 11/28/2023   BILITOT <0.2 11/28/2023   ALKPHOS 109 11/28/2023   AST 23 11/28/2023   ALT 16 11/28/2023   PROT 6.4 11/28/2023   ALBUMIN 4.2 11/28/2023   CALCIUM 9.2 11/28/2023   ANIONGAP 14 02/22/2018   EGFR 78 11/28/2023   GFR 91.14 10/09/2014   Lab Results  Component Value Date   CHOL 259 (H) 11/28/2023   Lab Results  Component Value Date   HDL 120 11/28/2023   Lab Results  Component Value Date   LDLCALC 123 (H) 11/28/2023   Lab Results  Component Value Date   TRIG 97 11/28/2023   Lab Results  Component Value Date   CHOLHDL 2.2 11/28/2023   Lab Results  Component Value Date   HGBA1C 5.2 02/01/2022      Assessment & Plan:   Assessment & Plan Acute pansinusitis, recurrence not specified Vital signs stable, patient in is no sign of acute distress, nontoxic.  Discussed findings with the patient.   -Will treat with Augmentin  BID x 10 days and  prednisone 20 mg X 5 days. -Increase fluid intake, rest. - Recommend plain Mucinex and allergy medication. -Will let us  know if symptoms not improving   Orders:   amoxicillin -clavulanate (AUGMENTIN ) 875-125 MG tablet; Take 1 tablet by mouth 2 (two) times daily.   predniSONE (DELTASONE) 20 MG tablet; Take 1 tablet (20 mg total) by mouth daily with breakfast for 5 days.   Assessment & Plan    Follow-up: No follow-ups on file.   Chelsea Aurora, NP    [1] No Known Allergies

## 2024-02-22 NOTE — Patient Instructions (Signed)

## 2024-03-02 ENCOUNTER — Other Ambulatory Visit: Payer: Self-pay | Admitting: Nurse Practitioner

## 2024-03-02 DIAGNOSIS — I10 Essential (primary) hypertension: Secondary | ICD-10-CM

## 2024-03-15 ENCOUNTER — Encounter: Admitting: Nurse Practitioner

## 2024-04-03 ENCOUNTER — Encounter: Payer: Self-pay | Admitting: Oncology

## 2024-04-10 ENCOUNTER — Other Ambulatory Visit

## 2024-04-12 ENCOUNTER — Ambulatory Visit: Admitting: Nurse Practitioner
